# Patient Record
Sex: Female | Born: 1940 | Race: White | Hispanic: Yes | Smoking: Never smoker
Health system: Southern US, Community
[De-identification: ages and names within clinical notes are randomized; demographics above are authoritative.]

## PROBLEM LIST (undated history)

## (undated) DIAGNOSIS — A048 Other specified bacterial intestinal infections: Secondary | ICD-10-CM

## (undated) DIAGNOSIS — I10 Essential (primary) hypertension: Secondary | ICD-10-CM

## (undated) HISTORY — DX: Other specified bacterial intestinal infections: A04.8

## (undated) HISTORY — DX: Essential (primary) hypertension: I10

---

## 2003-04-05 ENCOUNTER — Emergency Department (HOSPITAL_COMMUNITY): Admission: EM | Admit: 2003-04-05 | Discharge: 2003-04-05 | Payer: Self-pay | Admitting: Emergency Medicine

## 2003-05-07 ENCOUNTER — Emergency Department (HOSPITAL_COMMUNITY): Admission: EM | Admit: 2003-05-07 | Discharge: 2003-05-07 | Payer: Self-pay | Admitting: Emergency Medicine

## 2003-09-09 ENCOUNTER — Ambulatory Visit (HOSPITAL_COMMUNITY): Admission: RE | Admit: 2003-09-09 | Discharge: 2003-09-09 | Payer: Self-pay | Admitting: Internal Medicine

## 2004-03-19 ENCOUNTER — Ambulatory Visit: Payer: Self-pay | Admitting: Family Medicine

## 2004-04-21 ENCOUNTER — Ambulatory Visit: Payer: Self-pay | Admitting: Internal Medicine

## 2004-04-30 ENCOUNTER — Ambulatory Visit (HOSPITAL_COMMUNITY): Admission: RE | Admit: 2004-04-30 | Discharge: 2004-04-30 | Payer: Self-pay | Admitting: Family Medicine

## 2004-06-11 ENCOUNTER — Emergency Department (HOSPITAL_COMMUNITY): Admission: EM | Admit: 2004-06-11 | Discharge: 2004-06-11 | Payer: Self-pay | Admitting: Emergency Medicine

## 2004-06-11 ENCOUNTER — Ambulatory Visit: Payer: Self-pay | Admitting: Nurse Practitioner

## 2004-08-28 ENCOUNTER — Emergency Department (HOSPITAL_COMMUNITY): Admission: EM | Admit: 2004-08-28 | Discharge: 2004-08-28 | Payer: Self-pay | Admitting: Emergency Medicine

## 2005-08-13 ENCOUNTER — Ambulatory Visit: Payer: Self-pay | Admitting: Internal Medicine

## 2005-09-20 ENCOUNTER — Ambulatory Visit: Payer: Self-pay | Admitting: Internal Medicine

## 2005-09-21 ENCOUNTER — Ambulatory Visit (HOSPITAL_COMMUNITY): Admission: RE | Admit: 2005-09-21 | Discharge: 2005-09-21 | Payer: Self-pay | Admitting: Family Medicine

## 2005-11-09 ENCOUNTER — Encounter: Payer: Self-pay | Admitting: Internal Medicine

## 2005-11-09 ENCOUNTER — Ambulatory Visit: Payer: Self-pay | Admitting: Internal Medicine

## 2005-11-09 ENCOUNTER — Other Ambulatory Visit: Admission: RE | Admit: 2005-11-09 | Discharge: 2005-11-09 | Payer: Self-pay | Admitting: Internal Medicine

## 2006-01-20 ENCOUNTER — Ambulatory Visit: Payer: Self-pay | Admitting: Internal Medicine

## 2007-02-24 ENCOUNTER — Ambulatory Visit: Payer: Self-pay | Admitting: Internal Medicine

## 2007-02-24 LAB — CONVERTED CEMR LAB
ALT: 10 units/L (ref 0–35)
AST: 17 units/L (ref 0–37)
Albumin: 4.1 g/dL (ref 3.5–5.2)
Alkaline Phosphatase: 76 units/L (ref 39–117)
BUN: 16 mg/dL (ref 6–23)
CO2: 22 meq/L (ref 19–32)
Calcium: 8.8 mg/dL (ref 8.4–10.5)
Chloride: 108 meq/L (ref 96–112)
Cholesterol: 196 mg/dL (ref 0–200)
Creatinine, Ser: 0.76 mg/dL (ref 0.40–1.20)
Glucose, Bld: 75 mg/dL (ref 70–99)
HDL: 53 mg/dL (ref >39)
LDL Cholesterol: 100 mg/dL — ABNORMAL HIGH (ref 0–99)
Potassium: 4.8 meq/L (ref 3.5–5.3)
Sodium: 146 meq/L — ABNORMAL HIGH (ref 135–145)
TSH: 1.636 microintl units/mL (ref 0.350–5.50)
Total Bilirubin: 0.3 mg/dL (ref 0.3–1.2)
Total CHOL/HDL Ratio: 3.7
Total Protein: 7.4 g/dL (ref 6.0–8.3)
Triglycerides: 213 mg/dL — ABNORMAL HIGH (ref <150)
VLDL: 43 mg/dL — ABNORMAL HIGH (ref 0–40)

## 2007-09-21 ENCOUNTER — Other Ambulatory Visit: Admission: RE | Admit: 2007-09-21 | Discharge: 2007-09-21 | Payer: Self-pay | Admitting: Gynecology

## 2007-10-26 ENCOUNTER — Ambulatory Visit: Payer: Self-pay | Admitting: Internal Medicine

## 2007-10-26 LAB — CONVERTED CEMR LAB
BUN: 20 mg/dL (ref 6–23)
CO2: 25 meq/L (ref 19–32)
Calcium: 9.1 mg/dL (ref 8.4–10.5)
Chloride: 105 meq/L (ref 96–112)
Cholesterol: 193 mg/dL (ref 0–200)
Creatinine, Ser: 0.73 mg/dL (ref 0.40–1.20)
Direct LDL: 112 mg/dL — ABNORMAL HIGH
Glucose, Bld: 83 mg/dL (ref 70–99)
HDL: 55 mg/dL (ref >39)
LDL Cholesterol: 113 mg/dL — ABNORMAL HIGH (ref 0–99)
Potassium: 4.1 meq/L (ref 3.5–5.3)
Sodium: 141 meq/L (ref 135–145)
Total CHOL/HDL Ratio: 3.5
Triglycerides: 126 mg/dL (ref <150)
VLDL: 25 mg/dL (ref 0–40)

## 2008-04-02 ENCOUNTER — Ambulatory Visit: Payer: Self-pay | Admitting: Internal Medicine

## 2008-04-23 ENCOUNTER — Ambulatory Visit (HOSPITAL_COMMUNITY): Admission: RE | Admit: 2008-04-23 | Discharge: 2008-04-23 | Payer: Self-pay | Admitting: Family Medicine

## 2009-07-27 ENCOUNTER — Emergency Department (HOSPITAL_COMMUNITY): Admission: EM | Admit: 2009-07-27 | Discharge: 2009-07-27 | Payer: Self-pay | Admitting: Emergency Medicine

## 2012-10-19 ENCOUNTER — Ambulatory Visit (INDEPENDENT_AMBULATORY_CARE_PROVIDER_SITE_OTHER): Payer: Medicare Other | Admitting: Internal Medicine

## 2012-10-19 ENCOUNTER — Other Ambulatory Visit: Payer: Medicare Other

## 2012-10-19 ENCOUNTER — Ambulatory Visit: Payer: Medicare Other

## 2012-10-19 VITALS — BP 178/77 | HR 53 | Temp 98.0°F | Resp 16 | Ht 64.0 in | Wt 156.0 lb

## 2012-10-19 DIAGNOSIS — N390 Urinary tract infection, site not specified: Secondary | ICD-10-CM

## 2012-10-19 DIAGNOSIS — R109 Unspecified abdominal pain: Secondary | ICD-10-CM

## 2012-10-19 DIAGNOSIS — M545 Low back pain: Secondary | ICD-10-CM

## 2012-10-19 LAB — POCT UA - MICROSCOPIC ONLY
Casts, Ur, LPF, POC: NEGATIVE
Crystals, Ur, HPF, POC: NEGATIVE
Mucus, UA: POSITIVE
Yeast, UA: NEGATIVE

## 2012-10-19 LAB — POCT CBC
Granulocyte percent: 60.5 %G (ref 37–80)
HCT, POC: 39.2 % (ref 37.7–47.9)
Hemoglobin: 12.1 g/dL — AB (ref 12.2–16.2)
Lymph, poc: 2.5 (ref 0.6–3.4)
MCH, POC: 27.3 pg (ref 27–31.2)
MCHC: 30.9 g/dL — AB (ref 31.8–35.4)
MCV: 88.2 fL (ref 80–97)
MID (cbc): 0.3 (ref 0–0.9)
MPV: 9.3 fL (ref 0–99.8)
POC Granulocyte: 4.3 (ref 2–6.9)
POC LYMPH PERCENT: 34.6 %L (ref 10–50)
POC MID %: 4.9 %M (ref 0–12)
Platelet Count, POC: 237 10*3/uL (ref 142–424)
RBC: 4.44 M/uL (ref 4.04–5.48)
RDW, POC: 14.7 %
WBC: 7 10*3/uL (ref 4.6–10.2)

## 2012-10-19 LAB — POCT URINALYSIS DIPSTICK
Bilirubin, UA: NEGATIVE
Blood, UA: NEGATIVE
Glucose, UA: NEGATIVE
Ketones, UA: NEGATIVE
Nitrite, UA: POSITIVE
Spec Grav, UA: 1.02
Urobilinogen, UA: 1
pH, UA: 7

## 2012-10-19 LAB — POCT SEDIMENTATION RATE: POCT SED RATE: 56 mm/hr — AB (ref 0–22)

## 2012-10-19 MED ORDER — IBUPROFEN 600 MG PO TABS: 600.0000 mg | ORAL_TABLET | Freq: Three times a day (TID) | ORAL | Status: DC | PRN

## 2012-10-19 MED ORDER — CIPROFLOXACIN HCL 500 MG PO TABS: 500.0000 mg | ORAL_TABLET | Freq: Two times a day (BID) | ORAL | Status: DC

## 2012-10-19 NOTE — Patient Instructions (Addendum)
Flank Pain Flank pain refers to pain that is located on the side of the body between the upper abdomen and the back. The pain may occur over a short period of time (acute) or may be long-term or reoccurring (chronic). It may be mild or severe. Flank pain can be caused by many things. CAUSES  Some of the more common causes of flank pain include:  Muscle strains.   Muscle spasms.   A disease of your spine (vertebral disk disease).   A lung infection (pneumonia).   Fluid around your lungs (pulmonary edema).   A kidney infection.   Kidney stones.   A very painful skin rash caused by the chickenpox virus (shingles).   Gallbladder disease.  HOME CARE INSTRUCTIONS  Home care will depend on the cause of your pain. In general,  Rest as directed by your caregiver.  Drink enough fluids to keep your urine clear or pale yellow.  Only take over-the-counter or prescription medicines as directed by your caregiver. Some medicines may help relieve the pain.  Tell your caregiver about any changes in your pain.  Follow up with your caregiver as directed. SEEK IMMEDIATE MEDICAL CARE IF:   Your pain is not controlled with medicine.   You have new or worsening symptoms.  Your pain increases.   You have abdominal pain.   You have shortness of breath.   You have persistent nausea or vomiting.   You have swelling in your abdomen.   You feel faint or pass out.   You have blood in your urine.  You have a fever or persistent symptoms for more than 2 3 days.  You have a fever and your symptoms suddenly get worse. MAKE SURE YOU:   Understand these instructions.  Will watch your condition.  Will get help right away if you are not doing well or get worse. Document Released: 05/27/2005 Document Revised: 12/29/2011 Document Reviewed: 11/18/2011 [facility] Patient Information 2014 [place], [place]. Back Back Exercises Back exercises help treat and prevent back injuries.  The goal of back exercises is to increase the strength of your abdominal and back muscles and the flexibility of your back. These exercises should be started when you no longer have back pain. Back exercises include:  Pelvic Tilt. Lie on your back with your knees bent. Tilt your pelvis until the lower part of your back is against the floor. Hold this position 5 to 10 sec and repeat 5 to 10 times.  Knee to Chest. Pull first 1 knee up against your chest and hold for 20 to 30 seconds, repeat this with the other knee, and then both knees. This may be done with the other leg straight or bent, whichever feels better.  Sit-Ups or Curl-Ups. Bend your knees 90 degrees. Start with tilting your pelvis, and do a partial, slow sit-up, lifting your trunk only 30 to 45 degrees off the floor. Take at least 2 to 3 seconds for each sit-up. Do not do sit-ups with your knees out straight. If partial sit-ups are difficult, simply do the above but with only tightening your abdominal muscles and holding it as directed.  Hip-Lift. Lie on your back with your knees flexed 90 degrees. Push down with your feet and shoulders as you raise your hips a couple inches off the floor; hold for 10 seconds, repeat 5 to 10 times.  Back arches. Lie on your stomach, propping yourself up on bent elbows. Slowly press on your hands, causing an arch in your low  back. Repeat 3 to 5 times. Any initial stiffness and discomfort should lessen with repetition over time.  Shoulder-Lifts. Lie face down with arms beside your body. Keep hips and torso pressed to floor as you slowly lift your head and shoulders off the floor. Do not overdo your exercises, especially in the beginning. Exercises may cause you some mild back discomfort which lasts for a few minutes; however, if the pain is more severe, or lasts for more than 15 minutes, do not continue exercises until you see your caregiver. Improvement with exercise therapy for back problems is slow.  See  your caregivers for assistance with developing a proper back exercise program. Document Released: 05/13/2004 Document Revised: 06/28/2011 Document Reviewed: 02/04/2011 [facility] Patient Information 2014 [place], [place]. Urinary Tract Infection Urinary tract infections (UTIs) can develop anywhere along your urinary tract. Your urinary tract is your body's drainage system for removing wastes and extra water. Your urinary tract includes two kidneys, two ureters, a bladder, and a urethra. Your kidneys are a pair of bean-shaped organs. Each kidney is about the size of your fist. They are located below your ribs, one on each side of your spine. CAUSES Infections are caused by microbes, which are microscopic organisms, including fungi, viruses, and bacteria. These organisms are so small that they can only be seen through a microscope. Bacteria are the microbes that most commonly cause UTIs. SYMPTOMS  Symptoms of UTIs may vary by age and gender of the patient and by the location of the infection. Symptoms in young women typically include a frequent and intense urge to urinate and a painful, burning feeling in the bladder or urethra during urination. Older women and men are more likely to be tired, shaky, and weak and have muscle aches and abdominal pain. A fever may mean the infection is in your kidneys. Other symptoms of a kidney infection include pain in your back or sides below the ribs, nausea, and vomiting. DIAGNOSIS To diagnose a UTI, your caregiver will ask you about your symptoms. Your caregiver also will ask to provide a urine sample. The urine sample will be tested for bacteria and white blood cells. White blood cells are made by your body to help fight infection. TREATMENT  Typically, UTIs can be treated with medication. Because most UTIs are caused by a bacterial infection, they usually can be treated with the use of antibiotics. The choice of antibiotic and length of treatment depend on your symptoms  and the type of bacteria causing your infection. HOME CARE INSTRUCTIONS  If you were prescribed antibiotics, take them exactly as your caregiver instructs you. Finish the medication even if you feel better after you have only taken some of the medication.  Drink enough water and fluids to keep your urine clear or pale yellow.  Avoid caffeine, tea, and carbonated beverages. They tend to irritate your bladder.  Empty your bladder often. Avoid holding urine for long periods of time.  Empty your bladder before and after sexual intercourse.  After a bowel movement, women should cleanse from front to back. Use each tissue only once. SEEK MEDICAL CARE IF:   You have back pain.  You develop a fever.  Your symptoms do not begin to resolve within 3 days. SEEK IMMEDIATE MEDICAL CARE IF:   You have severe back pain or lower abdominal pain.  You develop chills.  You have nausea or vomiting.  You have continued burning or discomfort with urination. MAKE SURE YOU:   Understand these instructions.  Will  watch your condition.  Will get help right away if you are not doing well or get worse. Document Released: 01/13/2005 Document Revised: 10/05/2011 Document Reviewed: 05/14/2011 [facility] Patient Information 2014 [place], [place].

## 2012-10-19 NOTE — Progress Notes (Signed)
  Subjective:    Patient ID: Karen [name], female    DOB: Nov 22, 1940, 72 y.o.   MRN: [id number]  HPI Has few days of right low back pain,flank pain with no abdominal sxs. No urinary sxs. Has htn controlled, otherwise healthy. No nausea, fatigue, anorexia, weight loss.   Review of Systems     Objective:   Physical Exam  Constitutional: She is oriented to person, place, and time. She appears well-developed and well-nourished. No distress.  HENT:  Nose: Nose normal.  Eyes: EOM are normal. No scleral icterus.  Neck: Neck supple.  Cardiovascular: Normal rate, regular rhythm and normal heart sounds.   Pulmonary/Chest: Effort normal and breath sounds normal.  Abdominal: Soft. Bowel sounds are normal. There is no tenderness.  Musculoskeletal: Normal range of motion. She exhibits no edema and no tenderness.  Neurological: She is alert and oriented to person, place, and time. Coordination normal.  Skin: No rash noted.  Psychiatric: She has a normal mood and affect.   [facility] reading (PRIMARY) by  Dr Allon Costlow DDD L5-S1 Results for orders placed in visit on 10/19/12  POCT CBC      Result Value Range   WBC 7.0  4.6 - 10.2 K/uL   Lymph, poc 2.5  0.6 - 3.4   POC LYMPH PERCENT 34.6  10 - 50 %L   MID (cbc) 0.3  0 - 0.9   POC MID % 4.9  0 - 12 %M   POC Granulocyte 4.3  2 - 6.9   Granulocyte percent 60.5  37 - 80 %G   RBC 4.44  4.04 - 5.48 M/uL   Hemoglobin 12.1 (*) 12.2 - 16.2 g/dL   HCT, POC 40.9  81.1 - 47.9 %   MCV 88.2  80 - 97 fL   MCH, POC 27.3  27 - 31.2 pg   MCHC 30.9 (*) 31.8 - 35.4 g/dL   RDW, POC 91.4     Platelet Count, POC 237  142 - 424 K/uL   MPV 9.3  0 - 99.8 fL  POCT URINALYSIS DIPSTICK      Result Value Range   Color, UA yellow     Clarity, UA cloudy     Glucose, UA neg     Bilirubin, UA neg     Ketones, UA neg     Spec Grav, UA 1.020     Blood, UA neg     pH, UA 7.0     Protein, UA trace     Urobilinogen, UA 1.0     Nitrite, UA pos     Leukocytes, UA small (1+)     POCT UA - MICROSCOPIC ONLY      Result Value Range   WBC, Ur, HPF, POC 4-10     RBC, urine, microscopic 0-1     Bacteria, U Microscopic 3+     Mucus, UA positive     Epithelial cells, urine per micros 3-8     Crystals, Ur, HPF, POC neg     Casts, Ur, LPF, POC neg     Yeast, UA neg     CS urine  Sed rate 56      Assessment & Plan:  Right LBP/UTI/DDD Cipro

## 2012-10-22 LAB — URINE CULTURE: Colony Count: >=100000

## 2012-10-23 ENCOUNTER — Encounter: Payer: Self-pay | Admitting: Family Medicine

## 2015-12-06 ENCOUNTER — Encounter: Payer: Self-pay | Admitting: Family Medicine

## 2015-12-06 ENCOUNTER — Ambulatory Visit (INDEPENDENT_AMBULATORY_CARE_PROVIDER_SITE_OTHER): Payer: Medicare Other | Admitting: Family Medicine

## 2015-12-06 VITALS — BP 132/94 | HR 73 | Temp 98.0°F | Resp 16 | Ht 64.0 in | Wt 158.0 lb

## 2015-12-06 DIAGNOSIS — H8110 Benign paroxysmal vertigo, unspecified ear: Secondary | ICD-10-CM | POA: Diagnosis not present

## 2015-12-06 MED ORDER — MECLIZINE HCL 12.5 MG PO TABS: 12.5000 mg | ORAL_TABLET | Freq: Three times a day (TID) | ORAL | 1 refills | Status: DC | PRN

## 2015-12-06 NOTE — Patient Instructions (Addendum)
IF you received an x-ray today, [facility] will [facility] an [facility]. Please contact [facility] at [phone] with questions or concerns regarding your invoice.   IF you received labwork today, you will receive an invoice from [facility]. Please contact [facility] at [phone] with questions or concerns regarding your invoice.   Our billing staff will not be able to assist you with questions regarding bills from these companies.  You will be contacted with the lab results as soon as they are available. The fastest way to get your results is to activate your My Chart account. Instructions are located on the last page of this paperwork. If you have not heard from us regarding the results in 2 weeks, please contact this office.      Vrtigo posicional benigno (Benign Positional Vertigo) El vrtigo es la sensacin de que usted o todo lo que lo rodea se mueven cuando en realidad eso no sucede. El vrtigo posicional benigno es el tipo de vrtigo ms comn. La causa de este trastorno no es grave (es benigna). Algunos movimientos y determinadas posiciones pueden desencadenar el trastorno (es posicional). El vrtigo posicional benigno puede ser peligroso si ocurre mientras est haciendo algo que podra suponer un riesgo para usted y para los dems, por ejemplo, conduciendo un automvil.  CAUSAS En muchos de los [place], se desconoce la causa de este trastorno. Puede deberse a Hotel manageruna alteracin en una zona del odo interno que ayuda al cerebro a percibir el movimiento y a Administrator, Civil Servicecoordinar el equilibrio. Esta alteracin puede deberse a una infeccin viral (laberintitis), a una lesin en la cabeza o a los movimientos reiterados. FACTORES DE RIESGO Es ms probable que esta afeccin se manifieste en:  Las mujeres.  Las Smith Internationalpersonas mayores de [id number]. SNTOMAS Generalmente, los sntomas de este trastorno se presentan al mover la cabeza o los ojos en  diferentes direcciones. Pueden aparecer repentinamente y suelen durar menos de un minuto. Entre los sntomas se pueden incluir los siguientes:  Prdida del equilibrio y cadas.  Sensacin de estar dando vueltas o movindose.  Sensacin de que el entorno est dando vueltas o movindose.  Nuseas y vmitos.  Visin borrosa.  Mareos.  Movimientos oculares involuntarios (nistagmo). Los sntomas pueden ser leves y algo fastidiosos, o pueden ser graves e interferir en la vida cotidiana. Los episodios de vrtigo posicional benigno pueden repetirse (ser recurrentes) a lo largo del [place], y algunos movimientos pueden desencadenarlos. Los sntomas pueden mejorar con Museum/gallery conservatorel tiempo. DIAGNSTICO Generalmente, este trastorno se diagnostica con una historia clnica y un examen fsico de la cabeza, el cuello y los odos. Tal vez lo deriven a Catering managerun mdico especialista en problemas de la garganta, la nariz y el odo (otorrinolaringlogo), o a uno que se especializa en trastornos del sistema nervioso (neurlogo). Pueden hacerle otros estudios, entre ellos:  Health visitoresonancia magntica.  Tomografa computarizada.  Estudios de los Ecolabmovimientos oculares. El mdico puede pedirle que cambie rpidamente de posicin mientras observa si se presentan sntomas de vrtigo posicional benigno, por ejemplo, nistagmo. Los movimientos oculares se pueden estudiar con una electronistagmografa (ENG), con estimulacin trmica, mediante la maniobra de [place] o con la prueba de rotacin.  Electroencefalograma (EEG). Este estudio registra la actividad elctrica del cerebro.  Pruebas de audicin. Lissa MoralesRATAMIENTO Generalmente, para tratar este trastorno, el mdico le har movimientos especficos con la cabeza para que el odo interno se normalice. Mohawk IndustriesCuando los casos son graves, tal vez haya que realizar una ciruga, pero esto no es frecuente.  En algunos casos, el vrtigo posicional benigno se resuelve por s solo en el trmino de 2 o  4semanas. INSTRUCCIONES PARA EL CUIDADO EN EL HOGAR Seguridad  Muvase lentamente.No haga movimientos bruscos con el cuerpo o con la cabeza.  No conduzca.  No opere maquinaria pesada.  No haga ninguna tarea que podra ser peligrosa para usted o para otras personas en caso de que ocurriera un episodio de vrtigo.  Si tiene dificultad para caminar o mantener el equilibrio, use uEconomistn bastn para Photographermantener la estabilidad. Si se siente mareado o inestable, sintese de inmediato.  Reanude sus actividades normales como se lo haya indicado el mdico. Pregntele al mdico qu actividades son seguras para usted. Instrucciones generales  Baxter Internationalome los medicamentos de venta libre y los recetados solamente como se lo haya indicado el mdico.  Evite algunas posiciones o determinados movimientos como se lo haya indicado el mdico.  Beba suficiente lquido para Pharmacologistmantener la orina clara o de color amarillo plido.  Concurra a todas las visitas de control como se lo haya indicado el mdico. Esto es importante. SOLICITE ATENCIN MDICA SI:  Lance Mussiene fiebre.  El trastorno [place]empeora, o le aparecen sntomas nuevos.  Sus familiares o amigos advierten cambios en su comportamiento.  Las nuseas o los vmitos empeoran.  Tiene sensacin de hormigueo o de adormecimiento. SOLICITE ATENCIN MDICA DE INMEDIATO SI:  Tiene dificultad para hablar o para moverse.  Esta mareado todo Allied Waste Industriesel tiempo.  Se desmaya.  Tiene dolores de cabeza intensos.  Tiene debilidad en los brazos o las piernas.  Tiene cambios en la audicin o la visin.  Siente rigidez en el cuello.  Tiene sensibilidad a Statisticianla luz.   Esta informacin no tiene Theme park managercomo fin reemplazar el consejo del mdico. Asegrese de hacerle al mdico cualquier pregunta que tenga.   Document Released: 07/22/2008 Document Revised: 12/25/2014 Elsevier Interactive Patient Education [employer]2016 Elsevier Inc.

## 2015-12-06 NOTE — Progress Notes (Signed)
This is a 75 year old [place] woman who comes in with abrupt onset of vertigo this morning. She's had no headache or fever.  Patient also denies hearing loss or ear pain.  Patient's had this happen to her once before and was treated with meclizine successfully.  Patient's had no vomiting.   Objective: Patient is quite ataxic, brought in by her daughter BP (!) 132/94 (BP Location: Left Arm, Patient Position: Sitting, Cuff Size: Large)   Pulse 73   Temp 98 F (36.7 C) (Oral)   Resp 16   Ht 5\' 4"  (1.626 m)   Wt 158 lb (71.7 kg)   SpO2 97%   BMI 27.12 kg/m  HEENT: Unremarkable no trauma TMs normal Cranial nerves III through XII intact Muscle strength symmetric  Assessment: Benign positional vertigo, acute  Benign paroxysmal positional vertigo, unspecified laterality - Plan: meclizine (ANTIVERT) 12.5 MG tablet  If not improving or worsening over the next 24-48 hours, consider ENT consult Elvina SidleKurt Pasqual Farias, MD

## 2015-12-15 DIAGNOSIS — I10 Essential (primary) hypertension: Secondary | ICD-10-CM | POA: Insufficient documentation

## 2016-07-19 ENCOUNTER — Emergency Department (HOSPITAL_COMMUNITY)
Admission: EM | Admit: 2016-07-19 | Discharge: 2016-07-19 | Disposition: A | Discharge status: Home / Self Care | Payer: Medicare Other | Attending: Emergency Medicine | Admitting: Emergency Medicine

## 2016-07-19 ENCOUNTER — Emergency Department (HOSPITAL_COMMUNITY): Payer: Medicare Other

## 2016-07-19 ENCOUNTER — Encounter (HOSPITAL_COMMUNITY): Payer: Self-pay | Admitting: Emergency Medicine

## 2016-07-19 DIAGNOSIS — I10 Essential (primary) hypertension: Secondary | ICD-10-CM | POA: Diagnosis not present

## 2016-07-19 DIAGNOSIS — R42 Dizziness and giddiness: Secondary | ICD-10-CM

## 2016-07-19 DIAGNOSIS — Z5181 Encounter for therapeutic drug level monitoring: Secondary | ICD-10-CM | POA: Diagnosis not present

## 2016-07-19 DIAGNOSIS — Z79899 Other long term (current) drug therapy: Secondary | ICD-10-CM | POA: Insufficient documentation

## 2016-07-19 LAB — COMPREHENSIVE METABOLIC PANEL WITH GFR
ALT: 19 U/L (ref 14–54)
AST: 22 U/L (ref 15–41)
Albumin: 3.3 g/dL — ABNORMAL LOW (ref 3.5–5.0)
Alkaline Phosphatase: 70 U/L (ref 38–126)
Anion gap: 8 (ref 5–15)
BUN: 16 mg/dL (ref 6–20)
CO2: 32 mmol/L (ref 22–32)
Calcium: 9.1 mg/dL (ref 8.9–10.3)
Chloride: 99 mmol/L — ABNORMAL LOW (ref 101–111)
Creatinine, Ser: 1.06 mg/dL — ABNORMAL HIGH (ref 0.44–1.00)
GFR calc Af Amer: 58 mL/min — ABNORMAL LOW
GFR calc non Af Amer: 50 mL/min — ABNORMAL LOW
Glucose, Bld: 95 mg/dL (ref 65–99)
Potassium: 3.3 mmol/L — ABNORMAL LOW (ref 3.5–5.1)
Sodium: 139 mmol/L (ref 135–145)
Total Bilirubin: 0.3 mg/dL (ref 0.3–1.2)
Total Protein: 7 g/dL (ref 6.5–8.1)

## 2016-07-19 LAB — I-STAT CHEM 8, ED
BUN: 19 mg/dL (ref 6–20)
Calcium, Ion: 1.16 mmol/L (ref 1.15–1.40)
Chloride: 98 mmol/L — ABNORMAL LOW (ref 101–111)
Creatinine, Ser: 1.2 mg/dL — ABNORMAL HIGH (ref 0.44–1.00)
Glucose, Bld: 95 mg/dL (ref 65–99)
HCT: 35 % — ABNORMAL LOW (ref 36.0–46.0)
HEMOGLOBIN: 11.9 g/dL — AB (ref 12.0–15.0)
Potassium: 3.4 mmol/L — ABNORMAL LOW (ref 3.5–5.1)
SODIUM: 139 mmol/L (ref 135–145)
TCO2: 33 mmol/L (ref 0–100)

## 2016-07-19 LAB — URINALYSIS, ROUTINE W REFLEX MICROSCOPIC
Bilirubin Urine: NEGATIVE
Glucose, UA: NEGATIVE mg/dL
Hgb urine dipstick: NEGATIVE
Ketones, ur: NEGATIVE mg/dL
Leukocytes, UA: NEGATIVE
Nitrite: NEGATIVE
Protein, ur: NEGATIVE mg/dL
Specific Gravity, Urine: 1.005 (ref 1.005–1.030)
pH: 8 (ref 5.0–8.0)

## 2016-07-19 LAB — RAPID URINE DRUG SCREEN, HOSP PERFORMED
Amphetamines: NOT DETECTED
Barbiturates: NOT DETECTED
Benzodiazepines: NOT DETECTED
Cocaine: NOT DETECTED
Opiates: NOT DETECTED
Tetrahydrocannabinol: NOT DETECTED

## 2016-07-19 LAB — ETHANOL

## 2016-07-19 LAB — DIFFERENTIAL
Basophils Absolute: 0 10*3/uL (ref 0.0–0.1)
Basophils Relative: 0 %
EOS ABS: 0.1 10*3/uL (ref 0.0–0.7)
EOS PCT: 1 %
Lymphocytes Relative: 34 %
Lymphs Abs: 2.3 10*3/uL (ref 0.7–4.0)
Monocytes Absolute: 0.6 10*3/uL (ref 0.1–1.0)
Monocytes Relative: 8 %
NEUTROS PCT: 57 %
Neutro Abs: 3.9 10*3/uL (ref 1.7–7.7)

## 2016-07-19 LAB — CBC
HCT: 35.3 % — ABNORMAL LOW (ref 36.0–46.0)
Hemoglobin: 11.6 g/dL — ABNORMAL LOW (ref 12.0–15.0)
MCH: 27.5 pg (ref 26.0–34.0)
MCHC: 32.9 g/dL (ref 30.0–36.0)
MCV: 83.6 fL (ref 78.0–100.0)
Platelets: 193 K/uL (ref 150–400)
RBC: 4.22 MIL/uL (ref 3.87–5.11)
RDW: 14.2 % (ref 11.5–15.5)
WBC: 6.9 K/uL (ref 4.0–10.5)

## 2016-07-19 LAB — PROTIME-INR
INR: 0.99
Prothrombin Time: 13.1 seconds (ref 11.4–15.2)

## 2016-07-19 LAB — I-STAT TROPONIN, ED: Troponin i, poc: 0 ng/mL (ref 0.00–0.08)

## 2016-07-19 LAB — APTT: aPTT: 27 s (ref 24–36)

## 2016-07-19 MED ORDER — POTASSIUM CHLORIDE CRYS ER 20 MEQ PO TBCR
40.0000 meq | EXTENDED_RELEASE_TABLET | Freq: Once | ORAL | Status: AC
  Administered 2016-07-19: 40 meq via ORAL
  Filled 2016-07-19: qty 2

## 2016-07-19 MED ORDER — DIAZEPAM 5 MG PO TABS
5.0000 mg | ORAL_TABLET | Freq: Once | ORAL | Status: AC
  Administered 2016-07-19: 5 mg via ORAL
  Filled 2016-07-19: qty 1

## 2016-07-19 MED ORDER — SODIUM CHLORIDE 0.9 % IV BOLUS (SEPSIS)
500.0000 mL | Freq: Once | INTRAVENOUS | Status: AC
Start: 2016-07-19 — End: 2016-07-19
  Administered 2016-07-19: 500 mL via INTRAVENOUS

## 2016-07-19 MED ORDER — DIAZEPAM 5 MG PO TABS: 5.0000 mg | ORAL_TABLET | Freq: Four times a day (QID) | ORAL | 0 refills | Status: DC | PRN

## 2016-07-19 NOTE — ED Notes (Signed)
Pt aware of need for urine sample. Pt unable to void at this time.  

## 2016-07-19 NOTE — ED Notes (Signed)
Patient transported to MRI 

## 2016-07-19 NOTE — ED Triage Notes (Signed)
Per EMS- pt here for evaluation of hypertension, dizziness, and blurred vision. Pt takes medication for hypertension and vertigo. PT dizzy at all times, not just orthostatic. PIV 20G LAC.

## 2016-07-19 NOTE — ED Notes (Signed)
Pt transported to MRI 

## 2016-07-19 NOTE — ED Provider Notes (Signed)
[facility] Provider Note   CSN: [id number] Arrival date & time: 07/19/16  1432     History   Chief Complaint Chief Complaint  Patient presents with  . Blurred Vision  . Dizziness  . Hypertension    HPI Karen [name] is a 76 y.o. female.  HPI  76 year old female presents with intermittent dizziness and blurred vision. Patient states she felt like this one time before a year ago and was prescribed Antivert. The symptoms over the last 3 days and felt similar that she has tried Antivert again. However does not help besides making her sleepy. The symptoms are only present whenever she stands. The patient has not had any headache or neck pain or stiffness. No focal weakness or numbness. No ear ringing or ear pain. No recent illnesses. She felt the worst 2 days ago and yesterday. Today is not as bad but is still present. When she stands up it feels like she is drunk and unsteady. There is no near-syncope chest pain, or palpitations.  Past Medical History:  Diagnosis Date  . Hypertension     There are no active problems to display for this patient.   No past surgical history on file.  OB History    No data available       Home Medications    Prior to Admission medications   Medication Sig Start Date End Date Taking? Authorizing Provider  diazepam (VALIUM) 5 MG tablet Take 1 tablet (5 mg total) by mouth every 6 (six) hours as needed (dizziness/vertigo). 07/19/16   Pricilla Loveless, MD  ibuprofen (ADVIL,MOTRIN) 600 MG tablet Take 1 tablet (600 mg total) by mouth every 8 (eight) hours as needed for pain. 10/19/12   Jonita Albee, MD  lisinopril-hydrochlorothiazide (PRINZIDE,ZESTORETIC) 20-25 MG per tablet Take 1 tablet by mouth daily.    Historical Provider, MD  meclizine (ANTIVERT) 12.5 MG tablet Take 1 tablet (12.5 mg total) by mouth 3 (three) times daily as needed for dizziness. 12/06/15   Elvina Sidle, MD    Family History Family History  Problem Relation Age of Onset   . Hypertension Mother   . Hyperlipidemia Mother   . Hypertension Father     Social History Social History  Substance Use Topics  . Smoking status: Never Smoker  . Smokeless tobacco: Not on file  . Alcohol use No     Allergies   Patient has no known allergies.   Review of Systems Review of Systems  Constitutional: Negative for fever.  Eyes: Positive for visual disturbance.  Respiratory: Negative for shortness of breath.   Cardiovascular: Negative for chest pain and palpitations.  Gastrointestinal: Negative for vomiting.  Musculoskeletal: Negative for neck pain and neck stiffness.  Neurological: Positive for dizziness. Negative for speech difficulty, weakness, numbness and headaches.  All other systems reviewed and are negative.    Physical Exam Updated Vital Signs BP (!) 161/80   Pulse (!) 57   Temp 97.8 F (36.6 C) (Oral)   Resp 15   Ht  (1.6 m)   Wt 155 lb (70.3 kg)   SpO2 99%   BMI 27.46 kg/m   Physical Exam  Constitutional: She is oriented to person, place, and time. She appears well-developed and well-nourished.  HENT:  Head: Normocephalic and atraumatic.  Right Ear: Tympanic membrane, external ear and ear canal normal.  Left Ear: Tympanic membrane and external ear normal.  Nose: Nose normal.  Mouth/Throat: No oropharyngeal exudate.  Eyes: EOM are normal. Pupils are equal, round,  and reactive to light. Right eye exhibits no discharge. Left eye exhibits no discharge.  Neck: Normal range of motion. Neck supple.  Cardiovascular: Normal rate, regular rhythm and normal heart sounds.   Pulmonary/Chest: Effort normal and breath sounds normal.  Abdominal: Soft. There is no tenderness.  Neurological: She is alert and oriented to person, place, and time.  CN 3-12 grossly intact. 5/5 strength in all 4 extremities. Grossly normal sensation. Normal finger to nose. Normal heel to shin. Slightly off balance on ambulation in room  Skin: Skin is warm and dry.    Nursing note and vitals reviewed.    ED Treatments / Results  Labs (all labs ordered are listed, but only abnormal results are displayed) Labs Reviewed  CBC - Abnormal; Notable for the following:       Result Value   Hemoglobin 11.6 (*)    HCT 35.3 (*)    All other components within normal limits  COMPREHENSIVE METABOLIC PANEL - Abnormal; Notable for the following:    Potassium 3.3 (*)    Chloride 99 (*)    Creatinine, Ser 1.06 (*)    Albumin 3.3 (*)    GFR calc non Af Amer 50 (*)    GFR calc Af Amer 58 (*)    All other components within normal limits  URINALYSIS, ROUTINE W REFLEX MICROSCOPIC - Abnormal; Notable for the following:    Color, Urine COLORLESS (*)    All other components within normal limits  I-STAT CHEM 8, ED - Abnormal; Notable for the following:    Potassium 3.4 (*)    Chloride 98 (*)    Creatinine, Ser 1.20 (*)    Hemoglobin 11.9 (*)    HCT 35.0 (*)    All other components within normal limits  ETHANOL  PROTIME-INR  APTT  DIFFERENTIAL  RAPID URINE DRUG SCREEN, HOSP PERFORMED  I-STAT TROPOININ, ED    EKG  EKG Interpretation  Date/Time:  Monday July 19 2016 16:29:45 EDT Ventricular Rate:  63 PR Interval:    QRS Duration: 100 QT Interval:  430 QTC Calculation: 441 R Axis:   60 Text Interpretation:  Sinus rhythm Consider left atrial enlargement Abnormal R-wave progression, early transition no significant change since 2006 Confirmed by Mahagony Grieb MD, Shundra Wirsing [phone]) on 07/19/2016 5:13:13 PM       Radiology Mr Brain Wo Contrast  Result Date: 07/19/2016 CLINICAL DATA:  Acute vertigo. Evaluate for posterior circulation infarct. EXAM: MRI HEAD WITHOUT CONTRAST TECHNIQUE: Multiplanar, multiecho pulse sequences of the brain and surrounding structures were obtained without intravenous contrast. COMPARISON:  None. FINDINGS: Brain: No acute infarction, hemorrhage, hydrocephalus, extra-axial collection or mass lesion. Normal brain volume and white matter  appearance for age. Vascular: Normal flow voids. Skull and upper cervical spine: Normal marrow signal. Sinuses/Orbits: Negative. Other: Mild prominence of a posterior triangle lymph nodes on the right, 9 mm in short axis, presumably incidental in isolation. IMPRESSION: No acute finding, including infarct. Unremarkable appearance of the brain for age. Electronically Signed   By: Marnee Spring M.D.   On: 07/19/2016 16:08    Procedures Procedures (including critical care time)  Medications Ordered in ED Medications  diazepam (VALIUM) tablet 5 mg (5 mg Oral Given 07/19/16 1618)  potassium chloride SA (K-DUR,KLOR-CON) CR tablet 40 mEq (40 mEq Oral Given 07/19/16 1630)  sodium chloride 0.9 % bolus 500 mL (0 mLs Intravenous Stopped 07/19/16 1718)     Initial Impression / Assessment and Plan / ED Course  I have reviewed the triage  vital signs and the nursing notes.  Pertinent labs & imaging results that were available during my care of the patient were reviewed by me and considered in my medical decision making (see chart for details).  Clinical Course as of Jul 19 1905  Mon Jul 19, 2016  1503 Given age, HTN hx and blurry vision with vertigo, will get MRI. Given positional complaints it's more likely peripheral.   [SG]    Clinical Course User Index [SG] Pricilla Loveless, MD    Patient has mild hypokalemia, unlikely to cause these symptoms. Appear to have a peripheral vertigo given negative MRI and symptoms only with standing. Asymptomatic after valium. Will d/c with this, counseled on potential side effects. f/u with PCP.   Final Clinical Impressions(s) / ED Diagnoses   Final diagnoses:  Vertigo    New Prescriptions Discharge Medication List as of 07/19/2016  5:06 PM    START taking these medications   Details  diazepam (VALIUM) 5 MG tablet Take 1 tablet (5 mg total) by mouth every 6 (six) hours as needed (dizziness/vertigo)., Starting Mon 07/19/2016, Print         Pricilla Loveless,  MD 07/19/16 [phone]

## 2016-08-05 DIAGNOSIS — D509 Iron deficiency anemia, unspecified: Secondary | ICD-10-CM

## 2016-08-05 DIAGNOSIS — K219 Gastro-esophageal reflux disease without esophagitis: Secondary | ICD-10-CM | POA: Insufficient documentation

## 2016-08-05 HISTORY — DX: Iron deficiency anemia, unspecified: D50.9

## 2017-05-02 DIAGNOSIS — L659 Nonscarring hair loss, unspecified: Secondary | ICD-10-CM | POA: Insufficient documentation

## 2019-02-22 DIAGNOSIS — E559 Vitamin D deficiency, unspecified: Secondary | ICD-10-CM | POA: Insufficient documentation

## 2019-05-10 ENCOUNTER — Other Ambulatory Visit: Payer: Self-pay

## 2019-05-10 ENCOUNTER — Ambulatory Visit (INDEPENDENT_AMBULATORY_CARE_PROVIDER_SITE_OTHER): Payer: 59 | Admitting: Family Medicine

## 2019-05-10 ENCOUNTER — Encounter: Payer: Self-pay | Admitting: Family Medicine

## 2019-05-10 VITALS — BP 134/78 | HR 79 | Temp 96.7°F | Ht 63.25 in | Wt 161.2 lb

## 2019-05-10 DIAGNOSIS — Z7689 Persons encountering health services in other specified circumstances: Secondary | ICD-10-CM

## 2019-05-10 DIAGNOSIS — Z2821 Immunization not carried out because of patient refusal: Secondary | ICD-10-CM

## 2019-05-10 DIAGNOSIS — I10 Essential (primary) hypertension: Secondary | ICD-10-CM | POA: Diagnosis not present

## 2019-05-10 MED ORDER — AMLODIPINE BESYLATE 10 MG PO TABS: 10.0000 mg | ORAL_TABLET | Freq: Every day | ORAL | 3 refills | Status: DC

## 2019-05-10 NOTE — Progress Notes (Signed)
Karen [name] is a 79 y.o. female  Chief Complaint  Patient presents with  . Establish Care    Pt c/o skin spots on face and neck area that she would like to be looked today. She does have an appointment to see a demmatologist on 05/31/2019.   She has had a physical recently.  Pt informed to sign a record of release to get record from last PCP, in [facility].  Pt declines the Flu shot and PSV 13.    HPI: Karen [name] is a 79 y.o. female here to establish care with our office. She has 4 children, 10 grandchildren, 3 great grandchildren.  She states she had CPE with previous PCP a few months ago. She will complete records release form today to get records from previous PCP. She declines flu and pneumococcal vaccines today. She has upcoming derm appt 05/31/19 with [facility] for routine skin check and to eval few spots on her face and neck. She has a h/o HTN and states she has been on amlodipine 10mg  daily x 1+ year. She states her BP goes up when she goes to the doctor.   Specialists: derm scheduled in 2/21  Last CPE, labs: 12/2018  Last mammo: 06/2018 Last Dexa: 12/2018 Last colonoscopy: declines colonoscopy referral or cologuard  Med refills needed today: none   Past Medical History:  Diagnosis Date  . Hypertension     History reviewed. No pertinent surgical history.  Social History   Socioeconomic History  . Marital status: Widowed    Spouse name: Not on file  . Number of children: Not on file  . Years of education: Not on file  . Highest education level: Not on file  Occupational History  . Not on file  Tobacco Use  . Smoking status: Never Smoker  . Smokeless tobacco: Never Used  Substance and Sexual Activity  . Alcohol use: No  . Drug use: No  . Sexual activity: Never  Other Topics Concern  . Not on file  Social History Narrative  . Not on file   Social Determinants of Health   Financial Resource Strain:   . Difficulty of Paying Living Expenses: Not on file   Food Insecurity:   . Worried About Charity fundraiser in the Last Year: Not on file  . Ran Out of Food in the Last Year: Not on file  Transportation Needs:   . Lack of Transportation (Medical): Not on file  . Lack of Transportation (Non-Medical): Not on file  Physical Activity:   . Days of Exercise per Week: Not on file  . Minutes of Exercise per Session: Not on file  Stress:   . Feeling of Stress : Not on file  Social Connections:   . Frequency of Communication with Friends and Family: Not on file  . Frequency of Social Gatherings with Friends and Family: Not on file  . Attends Religious Services: Not on file  . Active Member of Clubs or Organizations: Not on file  . Attends Archivist Meetings: Not on file  . Marital Status: Not on file  Intimate Partner Violence:   . Fear of Current or Ex-Partner: Not on file  . Emotionally Abused: Not on file  . Physically Abused: Not on file  . Sexually Abused: Not on file    Family History  Problem Relation Age of Onset  . Hypertension Mother   . Hyperlipidemia Mother   . Hypertension Father       There  is no immunization history on file for this patient.  Outpatient Encounter Medications as of 05/10/2019  Medication Sig  . amLODipine (NORVASC) 10 MG tablet Take 1 tablet (10 mg total) by mouth daily.  . Cholecalciferol 50 MCG (2000 UT) TABS Take by mouth.  Marland Kitchen ibuprofen (ADVIL,MOTRIN) 600 MG tablet Take 1 tablet (600 mg total) by mouth every 8 (eight) hours as needed for pain.  . vitamin B-12 (CYANOCOBALAMIN) 500 MCG tablet Take 500 mcg by mouth daily.  . vitamin C (ASCORBIC ACID) 250 MG tablet Take 250 mg by mouth daily.  . [DISCONTINUED] amLODipine (NORVASC) 10 MG tablet Take 10 mg by mouth daily.  . [DISCONTINUED] pantoprazole (PROTONIX) 40 MG tablet Take by mouth.  . [DISCONTINUED] diazepam (VALIUM) 5 MG tablet Take 1 tablet (5 mg total) by mouth every 6 (six) hours as needed (dizziness/vertigo). (Patient not taking:  Reported on 05/10/2019)  . [DISCONTINUED] gabapentin (NEURONTIN) 100 MG capsule Take by mouth.  . [DISCONTINUED] lisinopril-hydrochlorothiazide (PRINZIDE,ZESTORETIC) 20-25 MG per tablet Take 1 tablet by mouth daily.  . [DISCONTINUED] losartan-hydrochlorothiazide (HYZAAR) 100-25 MG tablet Take 1 tablet by mouth daily.  . [DISCONTINUED] meclizine (ANTIVERT) 12.5 MG tablet Take 1 tablet (12.5 mg total) by mouth 3 (three) times daily as needed for dizziness. (Patient not taking: Reported on 05/10/2019)   No facility-administered encounter medications on file as of 05/10/2019.     ROS: Gen: no fever, chills  Skin: no rash, itching ENT: no ear pain, ear drainage, nasal congestion, rhinorrhea, sinus pressure, sore throat Eyes: no blurry vision, double vision Resp: no cough, wheeze,SOB CV: no CP, palpitations, LE edema,  GI: no heartburn, n/v/d/c, abd pain GU: no dysuria, urgency, frequency, hematuria  MSK: no joint pain, myalgias, back pain Neuro: no dizziness, headache, weakness, vertigo Psych: no depression, anxiety, insomnia   No Known Allergies  BP 134/78   Pulse 79   Temp (!) 96.7 F (35.9 C) (Temporal)   Ht 5' 3.25" (1.607 m)   Wt 161 lb 3.2 oz (73.1 kg)   SpO2 97%   BMI 28.33 kg/m   Physical Exam  Constitutional: She is oriented to person, place, and time. She appears well-developed and well-nourished. No distress.  Cardiovascular: Normal rate, regular rhythm, normal heart sounds and intact distal pulses.  Pulmonary/Chest: Effort normal and breath sounds normal. No respiratory distress. She has no wheezes.  Musculoskeletal:        General: No edema. Normal range of motion.  Neurological: She is alert and oriented to person, place, and time.  Psychiatric: She has a normal mood and affect. Her behavior is normal.     A/P:  1. Encounter to establish care with new doctor - will be due in 12/2019 for CPE, fasting labs  2. Essential hypertension - controlled, at goal - cont  with regular activity and low sodium diet Refill: - amLODipine (NORVASC) 10 MG tablet; Take 1 tablet (10 mg total) by mouth daily.  Dispense: 90 tablet; Refill: 3  3. Influenza vaccination declined by patient  4. Pneumococcal vaccine refused

## 2019-05-10 NOTE — Patient Instructions (Addendum)
Health Maintenance Due  Topic Date Due  . TETANUS/TDAP  01/11/1960  . PNA vac Low Risk Adult (1 of 2 - PCV13)Pt declines getting injection 01/10/2005  . INFLUENZA VACCINE Pt declines 11/18/2018    Depression screen PHQ 2/9 12/06/2015  Decreased Interest 0  Down, Depressed, Hopeless 0  PHQ - 2 Score 0

## 2019-06-13 ENCOUNTER — Ambulatory Visit (HOSPITAL_COMMUNITY)
Admission: EM | Admit: 2019-06-13 | Discharge: 2019-06-13 | Disposition: A | Discharge status: Home / Self Care | Payer: 59 | Attending: Family Medicine | Admitting: Family Medicine

## 2019-06-13 ENCOUNTER — Encounter (HOSPITAL_COMMUNITY): Payer: Self-pay

## 2019-06-13 ENCOUNTER — Other Ambulatory Visit: Payer: Self-pay

## 2019-06-13 DIAGNOSIS — M722 Plantar fascial fibromatosis: Secondary | ICD-10-CM

## 2019-06-13 MED ORDER — PREDNISONE 10 MG (21) PO TBPK: ORAL_TABLET | Freq: Every day | ORAL | 0 refills | Status: AC

## 2019-06-13 MED ORDER — IBUPROFEN 800 MG PO TABS: 800.0000 mg | ORAL_TABLET | Freq: Three times a day (TID) | ORAL | 0 refills | Status: DC | PRN

## 2019-06-13 NOTE — Discharge Instructions (Signed)
Take the ibuprofen as prescribed.  Rest and elevate your foot.  Apply ice packs 2-3 times a day for up to 20 minutes each.  Wear the Ace wrap as needed for comfort.   ° °Follow up with your primary care provider or an orthopedist if you symptoms continue or worsen;  Or if you develop new symptoms, such as numbness, tingling, or weakness.   ° ° ° °

## 2019-06-13 NOTE — ED Provider Notes (Signed)
[facility]    CSN: [id number] Arrival date & time: 06/13/19  1256      History   Chief Complaint Chief Complaint  Patient presents with  . Foot Pain    HPI Karen [name] is a 79 y.o. female.   Reports L foot pain to plantar surface of the foot since yesterday. Has made no attempts to treat at home. Pain is worse when standing, better when resting. Denies injury, fever, body aches, rash, other symptoms.  ROS per HPI  The history is provided by the patient.    Past Medical History:  Diagnosis Date  . Hypertension     There are no problems to display for this patient.   History reviewed. No pertinent surgical history.  OB History   No obstetric history on file.      Home Medications    Prior to Admission medications   Medication Sig Start Date End Date Taking? Authorizing Provider  amLODipine (NORVASC) 10 MG tablet Take 1 tablet (10 mg total) by mouth daily. 05/10/19   Overton Mam, DO  Cholecalciferol 50 MCG (2000 UT) TABS Take by mouth. 09/05/18   [provider]  ibuprofen (ADVIL) 800 MG tablet Take 1 tablet (800 mg total) by mouth every 8 (eight) hours as needed for moderate pain. 06/13/19   Moshe Cipro, NP  predniSONE (STERAPRED UNI-PAK 21 TAB) 10 MG (21) TBPK tablet Take by mouth daily for 6 days. Take 6 tablets on day 1, 5 tablets on day 2, 4 tablets on day 3, 3 tablets on day 4, 2 tablets on day 5, 1 tablet on day 6 06/13/19 06/19/19  Moshe Cipro, NP  vitamin B-12 (CYANOCOBALAMIN) 500 MCG tablet Take 500 mcg by mouth daily.    [provider]  vitamin C (ASCORBIC ACID) 250 MG tablet Take 250 mg by mouth daily.    [provider]    Family History Family History  Problem Relation Age of Onset  . Hypertension Mother   . Hyperlipidemia Mother   . Hypertension Father     Social History Social History   Tobacco Use  . Smoking status: Never Smoker  . Smokeless tobacco: Never Used  Substance Use  Topics  . Alcohol use: No  . Drug use: No     Allergies   Patient has no known allergies.   Review of Systems Review of Systems   Physical Exam Triage Vital Signs ED Triage Vitals  Enc Vitals Group     BP      Pulse      Resp      Temp      Temp src      SpO2      Weight      Height      Head Circumference      Peak Flow      Pain Score      Pain Loc      Pain Edu?      Excl. in GC?    No data found.  Updated Vital Signs BP (!) 146/74 (BP Location: Left Arm)   Pulse 76   Temp 98.4 F (36.9 C) (Oral)   Resp 17   Wt 162 lb 3.2 oz (73.6 kg)   SpO2 97%   BMI 28.51 kg/m   Visual Acuity Right Eye Distance:   Left Eye Distance:   Bilateral Distance:    Right Eye Near:   Left Eye Near:    Bilateral Near:  Physical Exam Vitals and nursing note reviewed.  Constitutional:      General: She is not in acute distress.    Appearance: Normal appearance. She is well-developed.  HENT:     Head: Normocephalic and atraumatic.     Mouth/Throat:     Mouth: Mucous membranes are moist.  Eyes:     Conjunctiva/sclera: Conjunctivae normal.  Cardiovascular:     Rate and Rhythm: Normal rate and regular rhythm.     Heart sounds: Normal heart sounds. No murmur.  Pulmonary:     Effort: Pulmonary effort is normal. No respiratory distress.     Breath sounds: Normal breath sounds.  Abdominal:     General: Bowel sounds are normal.     Palpations: Abdomen is soft.     Tenderness: There is no abdominal tenderness.  Musculoskeletal:     Cervical back: Neck supple.     Left foot: Tenderness present.     Comments: Tenderness to plantar surface of L foot, from mid foot to ball of foot.  Skin:    General: Skin is warm and dry.  Neurological:     General: No focal deficit present.     Mental Status: She is alert and oriented to person, place, and time.  Psychiatric:        Mood and Affect: Mood normal.        Behavior: Behavior normal.      UC Treatments / Results    Labs (all labs ordered are listed, but only abnormal results are displayed) Labs Reviewed - No data to display  EKG   Radiology No results found.  Procedures Procedures (including critical care time)  Medications Ordered in UC Medications - No data to display  Initial Impression / Assessment and Plan / UC Course  I have reviewed the triage vital signs and the nursing notes.  Pertinent labs & imaging results that were available during my care of the patient were reviewed by me and considered in my medical decision making (see chart for details).     Presents with L foot pain x 1 day. Likely plantar fasciitis. Ibuprofen 800mg  and predpack sent to pharmacy. Instructed on when to follow up. Instructed on when to go to the ER. Final Clinical Impressions(s) / UC Diagnoses   Final diagnoses:  Plantar fasciitis of left foot     Discharge Instructions     Take the ibuprofen as prescribed.  Rest and elevate your foot.  Apply ice packs 2-3 times a day for up to 20 minutes each.  Wear the Ace wrap as needed for comfort.    Follow up with your primary care provider or an orthopedist if you symptoms continue or worsen;  Or if you develop new symptoms, such as numbness, tingling, or weakness.       ED Prescriptions    Medication Sig Dispense Auth. Provider   ibuprofen (ADVIL) 800 MG tablet Take 1 tablet (800 mg total) by mouth every 8 (eight) hours as needed for moderate pain. 21 tablet Faustino Congress, NP   predniSONE (STERAPRED UNI-PAK 21 TAB) 10 MG (21) TBPK tablet Take by mouth daily for 6 days. Take 6 tablets on day 1, 5 tablets on day 2, 4 tablets on day 3, 3 tablets on day 4, 2 tablets on day 5, 1 tablet on day 6 21 tablet Faustino Congress, NP     I have reviewed the PDMP during this encounter.   Faustino Congress, NP 06/13/19 1354

## 2019-06-13 NOTE — ED Triage Notes (Signed)
Pt is here with left ankle/foot pain that started yesterday, states she did not do anything to cause it. Pt has taken Advil to relieve discomfort, states it only happens when walking.

## 2019-06-15 ENCOUNTER — Ambulatory Visit: Payer: 59 | Admitting: Family Medicine

## 2019-08-21 NOTE — Progress Notes (Signed)
This visit is being conducted via phone call  - after an attmept to do on video chat - due to the COVID-19 pandemic. This patient has given me verbal consent via phone to conduct this visit, patient states they are participating from their home address. Some vital signs may be absent or patient reported.   Patient identification: identified by name, DOB, and current address.   Subjective:   Karen [name] is a 79 y.o. female who presents for an Initial Medicare Annual Wellness Visit.  Review of Systems    Cardiac Risk Factors include: advanced age (>42men, >20 women) Home Safety/Smoke Alarms: Feels safe in home. Smoke alarms in place.   Female:      Mammo- defer to PCP      Dexa scan-defer to PCP            CCS- declines    Objective:     Advanced Directives 08/22/2019 07/19/2016  Does Patient Have a Medical Advance Directive? No No  Would patient like information on creating a medical advance directive? No - Patient declined -    Current Medications (verified) Outpatient Encounter Medications as of 08/22/2019  Medication Sig  . amLODipine (NORVASC) 10 MG tablet Take 1 tablet (10 mg total) by mouth daily.  . Cholecalciferol 50 MCG (2000 UT) TABS Take by mouth.  Marland Kitchen ibuprofen (ADVIL) 800 MG tablet Take 1 tablet (800 mg total) by mouth every 8 (eight) hours as needed for moderate pain.  . vitamin B-12 (CYANOCOBALAMIN) 500 MCG tablet Take 500 mcg by mouth daily.  . vitamin C (ASCORBIC ACID) 250 MG tablet Take 250 mg by mouth daily.   No facility-administered encounter medications on file as of 08/22/2019.    Allergies (verified) Patient has no known allergies.   History: Past Medical History:  Diagnosis Date  . Hypertension    History reviewed. No pertinent surgical history. Family History  Problem Relation Age of Onset  . Hypertension Mother   . Hyperlipidemia Mother   . Hypertension Father    Social History   Socioeconomic History  . Marital status: Widowed    Spouse  name: Not on file  . Number of children: Not on file  . Years of education: Not on file  . Highest education level: Not on file  Occupational History  . Not on file  Tobacco Use  . Smoking status: Never Smoker  . Smokeless tobacco: Never Used  Substance and Sexual Activity  . Alcohol use: No  . Drug use: No  . Sexual activity: Not Currently    Birth control/protection: None  Other Topics Concern  . Not on file  Social History Narrative  . Not on file   Social Determinants of Health   Financial Resource Strain: Low Risk   . Difficulty of Paying Living Expenses: Not hard at all  Food Insecurity: No Food Insecurity  . Worried About Programme researcher, broadcasting/film/video in the Last Year: Never true  . Ran Out of Food in the Last Year: Never true  Transportation Needs: No Transportation Needs  . Lack of Transportation (Medical): No  . Lack of Transportation (Non-Medical): No  Physical Activity:   . Days of Exercise per Week:   . Minutes of Exercise per Session:   Stress:   . Feeling of Stress :   Social Connections:   . Frequency of Communication with Friends and Family:   . Frequency of Social Gatherings with Friends and Family:   . Attends Religious Services:   .  Active Member of Clubs or Organizations:   . Attends Banker Meetings:   Marland Kitchen Marital Status:     Tobacco Counseling Counseling given: Not Answered   Clinical Intake: Pain : No/denies pain    Activities of Daily Living In your present state of health, do you have any difficulty performing the following activities: 08/22/2019  Hearing? N  Vision? N  Difficulty concentrating or making decisions? N  Walking or climbing stairs? N  Dressing or bathing? N  Doing errands, shopping? Y  Preparing Food and eating ? N  Using the Toilet? N  In the past six months, have you accidently leaked urine? N  Do you have problems with loss of bowel control? N  Managing your Medications? N  Managing your Finances? N    Housekeeping or managing your Housekeeping? N  Some recent data might be hidden     Immunizations and Health Maintenance  There is no immunization history on file for this patient. Health Maintenance Due  Topic Date Due  . COVID-19 Vaccine (1) Never done  . TETANUS/TDAP  Never done  . PNA vac Low Risk Adult (1 of 2 - PCV13) Never done    Patient Care Team: Overton Mam, DO as PCP - General (Family Medicine)  Indicate any recent Medical Services you may have received from other than [facility] providers in the past year (date may be approximate).     Assessment:   This is a routine wellness examination for Karen [name]. Physical assessment deferred to PCP.  Hearing/Vision screen Unable to assess. This visit is enabled though telemedicine due to Covid 19.   Dietary issues and exercise activities discussed: Current Exercise Habits: Home exercise routine, Type of exercise: walking, Time (Minutes): 30, Frequency (Times/Week): 5, Weekly Exercise (Minutes/Week): 150, Exercise limited by: None identified Diet (meal preparation, eat out, water intake, caffeinated beverages, dairy products, fruits and vegetables): in general, a "healthy" diet  , well balanced    Goals    . DIET - INCREASE WATER INTAKE      Depression Screen PHQ 2/9 Scores 08/22/2019 12/06/2015  PHQ - 2 Score 0 0    Fall Risk Fall Risk  08/22/2019 05/10/2019 12/06/2015  Falls in the past year? 0 0 No  Number falls in past yr: 0 - -  Injury with Fall? 0 - -  Follow up Education provided;Falls prevention discussed - -    Cognitive Function: Ad8 score reviewed for issues:  Issues making decisions:no  Less interest in hobbies / activities:no  Repeats questions, stories (family complaining):no  Trouble using ordinary gadgets (microwave, computer, phone):no  Forgets the month or year: no  Mismanaging finances: no  Remembering appts:no  Daily problems with thinking and/or memory:no Ad8 score is=0          Screening Tests Health Maintenance  Topic Date Due  . COVID-19 Vaccine (1) Never done  . TETANUS/TDAP  Never done  . PNA vac Low Risk Adult (1 of 2 - PCV13) Never done  . INFLUENZA VACCINE  11/18/2019  . DEXA SCAN  Completed      Plan:    Please schedule your next medicare wellness visit with me in 1 yr.  Continue to eat heart healthy diet (full of fruits, vegetables, whole grains, lean protein, water--limit salt, fat, and sugar intake) and increase physical activity as tolerated.  Continue doing brain stimulating activities (puzzles, reading, adult coloring books, staying active) to keep memory sharp.   Bring a copy of your living will  and/or healthcare power of attorney to your next office visit.    I have personally reviewed and noted the following in the patient's chart:   . Medical and social history . Use of alcohol, tobacco or illicit drugs  . Current medications and supplements . Functional ability and status . Nutritional status . Physical activity . Advanced directives . List of other physicians . Hospitalizations, surgeries, and ER visits in previous 12 months . Vitals . Screenings to include cognitive, depression, and falls . Referrals and appointments  In addition, I have reviewed and discussed with patient certain preventive protocols, quality metrics, and best practice recommendations. A written personalized care plan for preventive services as well as general preventive health recommendations were provided to patient.     Shela Nevin, [place]   08/22/2019

## 2019-08-22 ENCOUNTER — Ambulatory Visit (INDEPENDENT_AMBULATORY_CARE_PROVIDER_SITE_OTHER): Payer: 59 | Admitting: *Deleted

## 2019-08-22 ENCOUNTER — Encounter: Payer: Self-pay | Admitting: *Deleted

## 2019-08-22 DIAGNOSIS — Z0000 Encounter for general adult medical examination without abnormal findings: Secondary | ICD-10-CM | POA: Diagnosis not present

## 2019-08-22 NOTE — Patient Instructions (Signed)
Please schedule your next medicare wellness visit with me in 1 yr.  Continue to eat heart healthy diet (full of fruits, vegetables, whole grains, lean protein, water--limit salt, fat, and sugar intake) and increase physical activity as tolerated.  Continue doing brain stimulating activities (puzzles, reading, adult coloring books, staying active) to keep memory sharp.   Bring a copy of your living will and/or healthcare power of attorney to your next office visit.   Karen [name] , Thank you for taking time to come for your Medicare Wellness Visit. I appreciate your ongoing commitment to your health goals. Please review the following plan we discussed and let me know if I can assist you in the future.   These are the goals we discussed: Goals    . DIET - INCREASE WATER INTAKE       This is a list of the screening recommended for you and due dates:  Health Maintenance  Topic Date Due  . COVID-19 Vaccine (1) Never done  . Tetanus Vaccine  Never done  . Pneumonia vaccines (1 of 2 - PCV13) Never done  . Flu Shot  11/18/2019  . DEXA scan (bone density measurement)  Completed    Preventive Care 65 Years and Older, Female Preventive care refers to lifestyle choices and visits with your health care provider that can promote health and wellness. This includes:  A yearly physical exam. This is also called an annual well check.  Regular dental and eye exams.  Immunizations.  Screening for certain conditions.  Healthy lifestyle choices, such as diet and exercise. What can I expect for my preventive care visit? Physical exam Your health care provider will check:  Height and weight. These may be used to calculate body mass index (BMI), which is a measurement that tells if you are at a healthy weight.  Heart rate and blood pressure.  Your skin for abnormal spots. Counseling Your health care provider may ask you questions about:  Alcohol, tobacco, and drug use.  Emotional  well-being.  Home and relationship well-being.  Sexual activity.  Eating habits.  History of falls.  Memory and ability to understand (cognition).  Work and work Statistician.  Pregnancy and menstrual history. What immunizations do I need?  Influenza (flu) vaccine  This is recommended every year. Tetanus, diphtheria, and pertussis (Tdap) vaccine  You may need a Td booster every 10 years. Varicella (chickenpox) vaccine  You may need this vaccine if you have not already been vaccinated. Zoster (shingles) vaccine  You may need this after age 28. Pneumococcal conjugate (PCV13) vaccine  One dose is recommended after age 9. Pneumococcal polysaccharide (PPSV23) vaccine  One dose is recommended after age 76. Measles, mumps, and rubella (MMR) vaccine  You may need at least one dose of MMR if you were born in 1957 or later. You may also need a second dose. Meningococcal conjugate (MenACWY) vaccine  You may need this if you have certain conditions. Hepatitis A vaccine  You may need this if you have certain conditions or if you travel or work in places where you may be exposed to hepatitis A. Hepatitis B vaccine  You may need this if you have certain conditions or if you travel or work in places where you may be exposed to hepatitis B. Haemophilus influenzae type b (Hib) vaccine  You may need this if you have certain conditions. You may receive vaccines as individual doses or as more than one vaccine together in one shot (combination vaccines). Talk with  your health care provider about the risks and benefits of combination vaccines. What tests do I need? Blood tests  Lipid and cholesterol levels. These may be checked every 5 years, or more frequently depending on your overall health.  Hepatitis C test.  Hepatitis B test. Screening  Lung cancer screening. You may have this screening every year starting at age 17 if you have a 30-pack-year history of smoking and  currently smoke or have quit within the past 15 years.  Colorectal cancer screening. All adults should have this screening starting at age 56 and continuing until age 92. Your health care provider may recommend screening at age 40 if you are at increased risk. You will have tests every 1-10 years, depending on your results and the type of screening test.  Diabetes screening. This is done by checking your blood sugar (glucose) after you have not eaten for a while (fasting). You may have this done every 1-3 years.  Mammogram. This may be done every 1-2 years. Talk with your health care provider about how often you should have regular mammograms.  BRCA-related cancer screening. This may be done if you have a family history of breast, ovarian, tubal, or peritoneal cancers. Other tests  Sexually transmitted disease (STD) testing.  Bone density scan. This is done to screen for osteoporosis. You may have this done starting at age 24. Follow these instructions at home: Eating and drinking  Eat a diet that includes fresh fruits and vegetables, whole grains, lean protein, and low-fat dairy products. Limit your intake of foods with high amounts of sugar, saturated fats, and salt.  Take vitamin and mineral supplements as recommended by your health care provider.  Do not drink alcohol if your health care provider tells you not to drink.  If you drink alcohol: ? Limit how much you have to 0-1 drink a day. ? Be aware of how much alcohol is in your drink. In the [place] one drink equals one 12 oz bottle of beer (355 mL), one 5 oz glass of wine (148 mL), or one 1 oz glass of hard liquor (44 mL). Lifestyle  Take daily care of your teeth and gums.  Stay active. Exercise for at least 30 minutes on 5 or more days each week.  Do not use any products that contain nicotine or tobacco, such as cigarettes, e-cigarettes, and chewing tobacco. If you need help quitting, ask your health care provider.  If you are  sexually active, practice safe sex. Use a condom or other form of protection in order to prevent STIs (sexually transmitted infections).  Talk with your health care provider about taking a low-dose aspirin or statin. What's next?  Go to your health care provider once a year for a well check visit.  Ask your health care provider how often you should have your eyes and teeth checked.  Stay up to date on all vaccines. This information is not intended to replace advice given to you by your health care provider. Make sure you discuss any questions you have with your health care provider. Document Revised: 03/30/2018 Document Reviewed: 03/30/2018 Elsevier Patient Education  2020 [employer].

## 2019-08-30 ENCOUNTER — Other Ambulatory Visit: Payer: Self-pay

## 2019-08-31 ENCOUNTER — Ambulatory Visit (INDEPENDENT_AMBULATORY_CARE_PROVIDER_SITE_OTHER): Payer: 59 | Admitting: Family Medicine

## 2019-08-31 ENCOUNTER — Encounter: Payer: Self-pay | Admitting: Family Medicine

## 2019-08-31 VITALS — BP 148/80 | Temp 97.2°F | Ht 63.25 in | Wt 158.8 lb

## 2019-08-31 DIAGNOSIS — I10 Essential (primary) hypertension: Secondary | ICD-10-CM

## 2019-08-31 DIAGNOSIS — R209 Unspecified disturbances of skin sensation: Secondary | ICD-10-CM

## 2019-08-31 NOTE — Progress Notes (Signed)
Karen [name] is a 79 y.o. female  Chief Complaint  Patient presents with  . feet numbness    Pt c/o feet swelling, numbness and heaviness in both feet, at night when pt is in bed.  x2 months.    HPI: Karen [name] is a 79 y.o. female who complains of 67mo h/o "heaviness" in her feet and toes. No numbness or tingling. No pain. No burning sensation. Symptoms only occur at night when she lays down in bed, never during the day. She endorses intermittent swelling in B/L LE. No swelling in AM. Swelling is not consistent/not daily but feeling of "heaviness" occurs nightly. Symptoms do not interfere with sleep. No weakness in LE. No cramping. They do not feel cold or hot.   She is taking norvasc 10mg  daily. She states previous PCP has her on another med that she is not taking and "doesn't trust it". From review of records, it may be losartan/HCTZ. She does not have Rx with her but will call or send picture when she gets home.   Past Medical History:  Diagnosis Date  . Hypertension     History reviewed. No pertinent surgical history.  Social History   Socioeconomic History  . Marital status: Widowed    Spouse name: Not on file  . Number of children: Not on file  . Years of education: Not on file  . Highest education level: Not on file  Occupational History  . Not on file  Tobacco Use  . Smoking status: Never Smoker  . Smokeless tobacco: Never Used  Substance and Sexual Activity  . Alcohol use: No  . Drug use: No  . Sexual activity: Not Currently    Birth control/protection: None  Other Topics Concern  . Not on file  Social History Narrative  . Not on file   Social Determinants of Health   Financial Resource Strain: Low Risk   . Difficulty of Paying Living Expenses: Not hard at all  Food Insecurity: No Food Insecurity  . Worried About Charity fundraiser in the Last Year: Never true  . Ran Out of Food in the Last Year: Never true  Transportation Needs: No Transportation  Needs  . Lack of Transportation (Medical): No  . Lack of Transportation (Non-Medical): No  Physical Activity:   . Days of Exercise per Week:   . Minutes of Exercise per Session:   Stress:   . Feeling of Stress :   Social Connections:   . Frequency of Communication with Friends and Family:   . Frequency of Social Gatherings with Friends and Family:   . Attends Religious Services:   . Active Member of Clubs or Organizations:   . Attends Archivist Meetings:   Marland Kitchen Marital Status:   Intimate Partner Violence:   . Fear of Current or Ex-Partner:   . Emotionally Abused:   Marland Kitchen Physically Abused:   . Sexually Abused:     Family History  Problem Relation Age of Onset  . Hypertension Mother   . Hyperlipidemia Mother   . Hypertension Father       There is no immunization history on file for this patient.  Outpatient Encounter Medications as of 08/31/2019  Medication Sig  . amLODipine (NORVASC) 10 MG tablet Take 1 tablet (10 mg total) by mouth daily.  . Cholecalciferol 50 MCG (2000 UT) TABS Take by mouth.  Marland Kitchen ibuprofen (ADVIL) 800 MG tablet Take 1 tablet (800 mg total) by mouth every 8 (eight) hours as  needed for moderate pain.  . vitamin B-12 (CYANOCOBALAMIN) 500 MCG tablet Take 500 mcg by mouth daily.  . vitamin C (ASCORBIC ACID) 250 MG tablet Take 250 mg by mouth daily.   No facility-administered encounter medications on file as of 08/31/2019.     ROS: Pertinent positives and negatives noted in HPI. Remainder of ROS non-contributory    No Known Allergies  BP (!) 148/80 (BP Location: Left Arm, Patient Position: Sitting, Cuff Size: Normal)   Temp (!) 97.2 F (36.2 C) (Temporal)   Ht 5' 3.25" (1.607 m)   Wt 158 lb 12.8 oz (72 kg)   BMI 27.91 kg/m   BP Readings from Last 3 Encounters:  08/31/19 (!) 148/80  06/13/19 (!) 146/74  05/10/19 134/78     Physical Exam  Constitutional: She is oriented to person, place, and time. She appears well-developed and  well-nourished. No distress.  Cardiovascular: Normal rate, regular rhythm and normal pulses.  Musculoskeletal:        General: No tenderness, deformity or edema. Normal range of motion.  Neurological: She is alert and oriented to person, place, and time. She has normal reflexes. No sensory deficit. She exhibits normal muscle tone. Coordination normal.  Psychiatric: She has a normal mood and affect. Her behavior is normal.     A/P:  1. Essential hypertension - not at goal - pt taking norvasc 10mg  daily - she was apparently Rx'd a second med (? Losartan/hctz) but is not taking and is not sure that is the correct med or what dose was. She will call when she gets home with that info  2. Altered sensation of foot - "heaviness" in distal part of foot and toes, nightly, no associated symptoms x 2 mo. Pt is able to sleep, no pain, she can ambulate w/o issue. No associated swelling, temp changes, skin changes, numbness or paresthesias, cramping - normal exam - unclear etiology but pt states she has not been walking regularly the way she used to. She will resume her daily walking and see if this helps - f/u PRN - could consider labs, dopplers, etc

## 2019-09-06 ENCOUNTER — Telehealth: Payer: Self-pay | Admitting: Family Medicine

## 2019-09-06 DIAGNOSIS — R600 Localized edema: Secondary | ICD-10-CM

## 2019-09-06 DIAGNOSIS — I10 Essential (primary) hypertension: Secondary | ICD-10-CM

## 2019-09-06 MED ORDER — LOSARTAN POTASSIUM-HCTZ 100-25 MG PO TABS: 1.0000 | ORAL_TABLET | Freq: Every day | ORAL | 3 refills | Status: DC

## 2019-09-06 NOTE — Telephone Encounter (Signed)
Called and spoke with pt. Her son is no longer at the house. She states [facility] physician came to house to do her annual checkup and wants her to stop taking amlodipine 10mg  daily as it may be causing her intermittent LE edema. Pt has Rx for Losartan/HCTZ 100/25 1 tab daily that she was Rx'd as well but has not been taking. I advised her ok to stop norvasc and start losartan/HCTZ 100/25mg  daily and I will send new Rx to pharm. Pt will also come info lab appt, orders placed.  Front staff will call pt to schedule lab appt.

## 2019-09-06 NOTE — Telephone Encounter (Signed)
Pt son calling and saying that pt [facility] Doctor Dawson Bills [phone] , who came to do her yearly evaluation said she is taking amLODipine (NORVASC) 10 MG tablet and thinks it is the cause of her legs swelling and recommended she be switched to Losartan. He Recommended these labs be dione: BUN, EGFR for kidney BNP for Heart muscle stretch, UA for urine. The doctor is going to send a copy of summary to Dr Bryan Lemma. Pt requested call from provider because its been 3 days since she last took blood pressure pill. Please advise, pt number [phone]

## 2019-09-10 ENCOUNTER — Other Ambulatory Visit: Payer: Self-pay

## 2019-09-10 ENCOUNTER — Telehealth (INDEPENDENT_AMBULATORY_CARE_PROVIDER_SITE_OTHER): Payer: 59 | Admitting: Family

## 2019-09-10 ENCOUNTER — Encounter (HOSPITAL_COMMUNITY): Payer: Self-pay

## 2019-09-10 ENCOUNTER — Emergency Department (HOSPITAL_COMMUNITY)
Admission: EM | Admit: 2019-09-10 | Discharge: 2019-09-10 | Disposition: A | Discharge status: Home / Self Care | Payer: 59 | Attending: Emergency Medicine | Admitting: Emergency Medicine

## 2019-09-10 ENCOUNTER — Emergency Department (HOSPITAL_COMMUNITY): Payer: 59

## 2019-09-10 DIAGNOSIS — R55 Syncope and collapse: Secondary | ICD-10-CM | POA: Diagnosis present

## 2019-09-10 DIAGNOSIS — I10 Essential (primary) hypertension: Secondary | ICD-10-CM

## 2019-09-10 DIAGNOSIS — R2243 Localized swelling, mass and lump, lower limb, bilateral: Secondary | ICD-10-CM | POA: Diagnosis not present

## 2019-09-10 DIAGNOSIS — R32 Unspecified urinary incontinence: Secondary | ICD-10-CM | POA: Insufficient documentation

## 2019-09-10 DIAGNOSIS — Z79899 Other long term (current) drug therapy: Secondary | ICD-10-CM | POA: Insufficient documentation

## 2019-09-10 DIAGNOSIS — R5383 Other fatigue: Secondary | ICD-10-CM

## 2019-09-10 LAB — URINALYSIS, ROUTINE W REFLEX MICROSCOPIC
Bilirubin Urine: NEGATIVE
Glucose, UA: NEGATIVE mg/dL
Ketones, ur: NEGATIVE mg/dL
Leukocytes,Ua: NEGATIVE
Nitrite: NEGATIVE
Protein, ur: NEGATIVE mg/dL
Specific Gravity, Urine: 1.013 (ref 1.005–1.030)
pH: 5 (ref 5.0–8.0)

## 2019-09-10 LAB — BASIC METABOLIC PANEL
Anion gap: 10 (ref 5–15)
BUN: 20 mg/dL (ref 8–23)
CO2: 29 mmol/L (ref 22–32)
Calcium: 9.3 mg/dL (ref 8.9–10.3)
Chloride: 102 mmol/L (ref 98–111)
Creatinine, Ser: 0.77 mg/dL (ref 0.44–1.00)
GFR calc Af Amer: >60 mL/min (ref >60)
GFR calc non Af Amer: >60 mL/min (ref >60)
Glucose, Bld: 100 mg/dL — ABNORMAL HIGH (ref 70–99)
Potassium: 3.5 mmol/L (ref 3.5–5.1)
Sodium: 141 mmol/L (ref 135–145)

## 2019-09-10 LAB — CBG MONITORING, ED: Glucose-Capillary: 108 mg/dL — ABNORMAL HIGH (ref 70–99)

## 2019-09-10 LAB — CBC
HCT: 37.6 % (ref 36.0–46.0)
Hemoglobin: 11.9 g/dL — ABNORMAL LOW (ref 12.0–15.0)
MCH: 27.6 pg (ref 26.0–34.0)
MCHC: 31.6 g/dL (ref 30.0–36.0)
MCV: 87.2 fL (ref 80.0–100.0)
Platelets: 188 10*3/uL (ref 150–400)
RBC: 4.31 MIL/uL (ref 3.87–5.11)
RDW: 14.1 % (ref 11.5–15.5)
WBC: 7.2 10*3/uL (ref 4.0–10.5)
nRBC: 0 % (ref 0.0–0.2)

## 2019-09-10 MED ORDER — SODIUM CHLORIDE 0.9% FLUSH: 3.0000 mL | Freq: Once | INTRAVENOUS | Status: DC

## 2019-09-10 NOTE — ED Notes (Signed)
Claudia, PA made aware of increasing BP trend.

## 2019-09-10 NOTE — ED Triage Notes (Signed)
Patient states she passed out 2 days ago. patient states she has had increased tiredness x 3 days.  Patient states she did not hit her head when she passed out.

## 2019-09-10 NOTE — ED Notes (Signed)
Per PA patient may eat or drink.   Patient given water.

## 2019-09-10 NOTE — Patient Instructions (Signed)
Advised daughter to take her mother to the ED

## 2019-09-10 NOTE — Discharge Instructions (Signed)
You were seen in the ER for passing out episode  Lab work, EKG, head CT, were all normal.  Urine had some bacteria in it but we sent it to the lab to rule out an infection  The cause of your passing out episode is not clear but since everything was normal you are being discharged and encouraged to follow up with your doctor for more discussion.  Your blood pressure is not very well controlled at the moment. Your doctor may refer you to a cardiologist or neurologist.   Return to the ER for recurrent passing out, seizure or convulsion, chest pain, shortness of breath, palpitations, leg swelling  Rest. Stay hydrated. Eat well. Take all medicines as prescribed

## 2019-09-10 NOTE — ED Provider Notes (Addendum)
[facility]-EMERGENCY DEPT Provider Note   CSN: [id number] Arrival date & time: 09/10/19  1344     History Chief Complaint  Patient presents with   Fatigue   Loss of Consciousness    Karen [name] is a 79 y.o. female with history of hypertension presents to the ER for evaluation of passing out episode that occurred 2 days ago.  Patient had telemedicine visit with PCP who recommended ER evaluation for this.  Patient states on Saturday she went to [place] with her daughter.  She had sudden onset of lightheadedness and her vision went black.  She sat on a chair and remembers people talking to her and drinking water.  States she was aware of everything and did not think she passed out.  States things looked "fuzzy" but she was aware of everything going on around her.  However, her daughter told her that she passed out, and her body jerked and also urinated on herself.  EMS/ambulance was called.  Patient states they did an EKG and checked her glucose and everything looked normal.  She did not want to come to the hospital at that time.  Since Saturday she has felt like she has no energy to do anything but otherwise feels normal. Wonders if maybe it is related to being depressed because she is having issues with her children and a difficult time with her daughter.  States that she is very exhausted from taking care of her 32-year-old grandson.  She feels like it is too much for her but is afraid to tell her daughter that she cannot take care of him anymore because she has a temper.  Feels like she is not taking care of her self, eating well or resting.  States 20 years ago she had a passing out episode that was related to anemia.  States otherwise she is very healthy and has never been hospitalized.  Of note, for the last several weeks she has noticed intermittent leg swelling and heaviness.  She had a PCP appointment but was told that everything looked okay.  She had a visit from a  doctor making house calls who told her one of the side effects of her blood pressure medicine caused leg swelling so this was recently switched.  She is not taking amlodipine.  Denies any recent illnesses.  No fevers.  Denies associated chest pain, palpitations, shortness of breath.  No recent vomiting, diarrhea.  No recent dysuria or hematuria.  Denies significant alcohol use.  Karen [name] daughter [phone] was with her.  HPI     Past Medical History:  Diagnosis Date   Hypertension     There are no problems to display for this patient.   History reviewed. No pertinent surgical history.   OB History   No obstetric history on file.     Family History  Problem Relation Age of Onset   Hypertension Mother    Hyperlipidemia Mother    Hypertension Father     Social History   Tobacco Use   Smoking status: Never Smoker   Smokeless tobacco: Never Used  Substance Use Topics   Alcohol use: No   Drug use: No    Home Medications Prior to Admission medications   Medication Sig Start Date End Date Taking? Authorizing Provider  Cholecalciferol 50 MCG (2000 UT) TABS Take 2,000 Units by mouth daily.  09/05/18  Yes [provider]  ibuprofen (ADVIL) 800 MG tablet Take 1 tablet (800 mg total) by mouth every  8 (eight) hours as needed for moderate pain. 06/13/19  Yes Moshe Cipro, NP  losartan-hydrochlorothiazide (HYZAAR) 100-25 MG tablet Take 1 tablet by mouth daily. 09/06/19  Yes Cirigliano, Mary K, DO  vitamin C (ASCORBIC ACID) 250 MG tablet Take 250 mg by mouth daily.   Yes [provider]    Allergies    Patient has no known allergies.  Review of Systems   Review of Systems  Constitutional: Positive for fatigue.  Neurological: Positive for dizziness and seizures.  All other systems reviewed and are negative.   Physical Exam Updated Vital Signs BP (!) 156/109    Pulse 62    Temp 98.3 F (36.8 C) (Oral)    Resp 15    Ht 5\' 3"  (1.6 m)    Wt  72.6 kg    SpO2 100%    BMI 28.34 kg/m   Physical Exam Vitals and nursing note reviewed.  Constitutional:      General: She is not in acute distress.    Appearance: She is well-developed.     Comments: NAD.  HENT:     Head: Normocephalic and atraumatic.     Right Ear: External ear normal.     Left Ear: External ear normal.     Nose: Nose normal.  Eyes:     Conjunctiva/sclera: Conjunctivae normal.  Cardiovascular:     Rate and Rhythm: Normal rate and regular rhythm.     Heart sounds: Normal heart sounds.     Comments: 1+ radial and DP pulses bilaterally.  No lower extremity edema. Pulmonary:     Effort: Pulmonary effort is normal.     Breath sounds: Normal breath sounds.  Musculoskeletal:        General: Normal range of motion.     Cervical back: Normal range of motion and neck supple.  Skin:    General: Skin is warm and dry.     Capillary Refill: Capillary refill takes less than 2 seconds.  Neurological:     Mental Status: She is alert and oriented to person, place, and time.     Comments:  Alert and oriented to self, place, time and event.  Speech is fluent without dysarthria or dysphasia. Strength 5/5 with hand grip and ankle F/E.   Sensation to light touch intact in face, hands and feet. Normal gait around the bed. No pronator drift. No leg drop. Normal finger-to-nose.   CN I not tested CN II grossly intact visual fields bilaterally. Unable to visualize posterior eye. CN III, IV, VI PEERL and EOMs intact bilaterally CN V light touch intact in all 3 divisions of trigeminal nerve CN VII facial movements symmetric CN VIII not tested CN IX, X no uvula deviation, symmetric rise of soft palate  CN XI 5/5 SCM and trapezius strength bilaterally  CN XII Midline tongue protrusion, symmetric L/R movements  Psychiatric:        Behavior: Behavior normal.        Thought Content: Thought content normal.        Judgment: Judgment normal.     ED Results / Procedures /  Treatments   Labs (all labs ordered are listed, but only abnormal results are displayed) Labs Reviewed  BASIC METABOLIC PANEL - Abnormal; Notable for the following components:      Result Value   Glucose, Bld 100 (*)    All other components within normal limits  CBC - Abnormal; Notable for the following components:   Hemoglobin 11.9 (*)  All other components within normal limits  URINALYSIS, ROUTINE W REFLEX MICROSCOPIC - Abnormal; Notable for the following components:   Hgb urine dipstick MODERATE (*)    Bacteria, UA RARE (*)    All other components within normal limits  CBG MONITORING, ED - Abnormal; Notable for the following components:   Glucose-Capillary 108 (*)    All other components within normal limits  URINE CULTURE    EKG EKG Interpretation  Date/Time:  Monday Sep 10 2019 14:30:02 EDT Ventricular Rate:  61 PR Interval:    QRS Duration: 90 QT Interval:  419 QTC Calculation: 422 R Axis:   53 Text Interpretation: Sinus rhythm Abnormal R-wave progression, early transition No significant change since last tracing Confirmed by Alvira Monday ([id number]) on 09/10/2019 5:25:44 PM   Radiology CT Head Wo Contrast  Result Date: 09/10/2019 CLINICAL DATA:  Seizure versus syncope. EXAM: CT HEAD WITHOUT CONTRAST TECHNIQUE: Contiguous axial images were obtained from the base of the skull through the vertex without intravenous contrast. COMPARISON:  None. FINDINGS: Brain: There is mild cerebral atrophy with widening of the extra-axial spaces and ventricular dilatation. There are areas of decreased attenuation within the white matter tracts of the supratentorial brain, consistent with microvascular disease changes. Vascular: No hyperdense vessel or unexpected calcification. Skull: Normal. Negative for fracture or focal lesion. Sinuses/Orbits: No acute finding. Other: None. IMPRESSION: No acute intracranial pathology. Electronically Signed   By: Aram Candela M.D.   On: 09/10/2019  16:49    Procedures Procedures (including critical care time)  Medications Ordered in ED Medications  sodium chloride flush (NS) 0.9 % injection 3 mL (has no administration in time range)    ED Course  I have reviewed the triage vital signs and the nursing notes.  Pertinent labs & imaging results that were available during my care of the patient were reviewed by me and considered in my medical decision making (see chart for details).  Clinical Course as of Sep 09 1753  Mon Sep 10, 2019  1620 Patient has no UTI symptoms.  No nitrates, leukocytes, WBCs, RBCs.  Will send for culture.  Bacteria, UA(!): RARE [CG]  1620 Hemoglobin(!): 11.9 [CG]  1620 Glucose(!): 100 [CG]  1620 Recent medication change.  Patient took her medicine this morning.  States usually her blood pressure is in the 120s systolic.  Denies any headache, vision changes, chest pain, back or abdominal pain.  Reports intermittent leg swelling at home but none currently.  BP(!): 186/70 [CG]    Clinical Course User Index [CG] Liberty Handy, PA-C   MDM Rules/Calculators/A&P                      Patient's EMR and nursing notes reviewed to obtain pertinent history and assist with MDM.  I obtained more information from patient's daughter Shanda Bumps who states patient told her her vision got dark and she felt lightheaded.  Patient sat down and went unconscious for only a few seconds.  Daughter noticed from her waist up patient was "jerking" and she had urinated on herself.  This lasted only a few seconds and patient eventually came to.  Patient was fully oriented when she woke up.   Exam benign.  Initially insert this has improved without intervention.  She has no headache, vision changes, lower extremity edema to suggest hypertensive emergency.  Her neuro exam is normal.  Labs, urinalysis, EKG obtained in triage.  We will add a CT head.  1750: ER work-up personally reviewed  and interpreted, very benign.  No significant  anemia.  No electrolyte urinalysis with bacteriuria but otherwise normal.  She has no UTI symptoms, will send for culture.  She does not show any ischemia, arrhythmias or blocks.  Head CT obtained given possible "jerking" by daughter.  Patient has no history of seizures, neuro deficits and actually states she was awake the entire time and aware of her surroundings.  CT head is nonacute.  Patient was monitored in the ER.  She is ambulating here, drinking and eating.  Vital signs are acceptable.  Patient discussed with EDP.  At this time deemed appropriate to be discharged with outpatient work-up for syncope.  Favoring vasovagal/orthostatic syncope.  Considered cardiac or neurological process less likely.  Explained this to patient who is comfortable being discharged.  She has close follow-up with PCP.  Encouraged her to call her PCP to make an appointment, consider cardiology follow-up.  Strict return precautions discussed with patient who is agreement with current treatment and discharge plan.  Final Clinical Impression(s) / ED Diagnoses Final diagnoses:  Syncope and collapse  Elevated blood pressure reading with diagnosis of hypertension    Rx / DC Orders ED Discharge Orders    None         Kinnie Feil, PA-C 09/10/19 1755    Gareth Morgan, MD 09/11/19 [phone]

## 2019-09-10 NOTE — Progress Notes (Signed)
I connected with  Karen [name] on 09/10/19 by a video enabled telemedicine application and verified that I am speaking with the correct person using two identifiers.   I discussed the limitations of evaluation and management by telemedicine. The patient expressed understanding and agreed to proceed.  Virtual Visit via Telephone Note  I connected with Karen [name] on 09/10/19 at  2:00 PM EDT by telephone and verified that I am speaking with the correct person using two identifiers.  Location: Patient: Home with daughter, spoke with daughter on the phone Provider: Yolanda Manges     I discussed the limitations, risks, security and privacy concerns of performing an evaluation and management service by telephone and the availability of in person appointments. I also discussed with the patient that there may be a patient responsible charge related to this service. The patient expressed understanding and agreed to proceed.   History of Present Illness:History obtained from the daughter. Daughter reports being in [place] with her mother on Saturday, mom complained of feeling weak, she passed out and appeared to be convulsing. She reports EMS arriving and she decided to take her mother home after she begin to come around and reestablish consciousness. Today, daughter reports mom calling her to tell her that she does not feel well. She left work to check on her mother and she is weak and fatigued again. Daughter reports blood pressure medication was changed from Amlodipine to Losartan HCT recently. Blood pressure has been stable. Daughter is requesting that her mother get labs done in the office.     Observations/Objective:   Assessment and Plan: Fatigue/Weakness, Hypertension    Follow Up Instructions: Advised daughter to take her mother into the ED for further evaluation. Daughter again states, "we just need labs." I explained that labs would take longer through the primary care office and we  need to identify what is causing her mother to not feel well and pass out, today. Daughter not happy with the outcome but says "ok." I am concerned that something serious may be underlying with the seizure-like described activity that occurred on Saturday.    I discussed the assessment and treatment plan with the patient. The patient was provided an opportunity to ask questions and all were answered. The patient agreed with the plan and demonstrated an understanding of the instructions.   The patient was advised to call back or seek an in-person evaluation if the symptoms worsen or if the condition fails to improve as anticipated.  I provided 20 minutes of non-face-to-face time during this encounter.   Eulis Foster, FNP

## 2019-09-12 ENCOUNTER — Other Ambulatory Visit: Payer: Self-pay

## 2019-09-12 LAB — URINE CULTURE

## 2019-09-13 ENCOUNTER — Telehealth: Payer: Self-pay | Admitting: Family Medicine

## 2019-09-13 ENCOUNTER — Encounter: Payer: Self-pay | Admitting: Nurse Practitioner

## 2019-09-13 ENCOUNTER — Ambulatory Visit (INDEPENDENT_AMBULATORY_CARE_PROVIDER_SITE_OTHER): Payer: 59 | Admitting: Nurse Practitioner

## 2019-09-13 VITALS — BP 132/88 | HR 72 | Temp 96.8°F | Ht 63.0 in | Wt 158.8 lb

## 2019-09-13 DIAGNOSIS — R42 Dizziness and giddiness: Secondary | ICD-10-CM

## 2019-09-13 MED ORDER — MECLIZINE HCL 12.5 MG PO TABS
12.5000 mg | ORAL_TABLET | Freq: Two times a day (BID) | ORAL | 0 refills | Status: DC | PRN
Start: 2019-09-13 — End: 2020-09-10

## 2019-09-13 NOTE — Telephone Encounter (Signed)
Patient son wanted to see if a referral be put in for patient to see a neurologist. Informed patient that Dr. Salena Saner is out of the office this week and it may be next week before he hears anything back. CB is [phone]

## 2019-09-13 NOTE — Patient Instructions (Addendum)
Call office if no improvement by Monday. Consider PT referral if no improvement. BP is normal today Advised to maintain adequate oral hydration  Vertigo Vertigo is the feeling that you or the things around you are moving when they are not. This feeling can come and go at any time. Vertigo often goes away on its own. This condition can be dangerous if it happens when you are doing activities like driving or working with machines. Your doctor will do tests to find the cause of your vertigo. These tests will also help your doctor decide on the best treatment for you. Follow these instructions at home: Eating and drinking      Drink enough fluid to keep your pee (urine) pale yellow.  Do not drink alcohol. Activity  Return to your normal activities as told by your doctor. Ask your doctor what activities are safe for you.  In the morning, first sit up on the side of the bed. When you feel okay, stand slowly while you hold onto something until you know that your balance is fine.  Move slowly. Avoid sudden body or head movements or certain positions, as told by your doctor.  Use a cane if you have trouble standing or walking.  Sit down right away if you feel dizzy.  Avoid doing any tasks or activities that can cause danger to you or others if you get dizzy.  Avoid bending down if you feel dizzy. Place items in your home so that they are easy for you to reach without leaning over.  Do not drive or use heavy machinery if you feel dizzy. General instructions  Take over-the-counter and prescription medicines only as told by your doctor.  Keep all follow-up visits as told by your doctor. This is important. Contact a doctor if:  Your medicine does not help your vertigo.  You have a fever.  Your problems get worse or you have new symptoms.  Your family or friends see changes in your behavior.  The feeling of being sick to your stomach gets worse.  Your vomiting gets worse.  You  lose feeling (have numbness) in part of your body.  You feel prickling and tingling in a part of your body. Get help right away if:  You have trouble moving or talking.  You are always dizzy.  You pass out (faint).  You get very bad headaches.  You feel weak in your hands, arms, or legs.  You have changes in your hearing.  You have changes in how you see (vision).  You get a stiff neck.  Bright light starts to bother you. Summary  Vertigo is the feeling that you or the things around you are moving when they are not.  Your doctor will do tests to find the cause of your vertigo.  You may be told to avoid some tasks, positions, or movements.  Contact a doctor if your medicine is not helping, or if you have a fever, new symptoms, or a change in behavior.  Get help right away if you get very bad headaches, or if you have changes in how you speak, hear, or see. This information is not intended to replace advice given to you by your health care provider. Make sure you discuss any questions you have with your health care provider. Document Revised: 02/27/2018 Document Reviewed: 02/27/2018 Elsevier Patient Education  2020 [employer].

## 2019-09-13 NOTE — Progress Notes (Signed)
Subjective:  Patient ID: Karen [name], female    DOB: Sep 24, 1940  Age: 79 y.o. MRN: [id number]  CC: Dizziness (pt very dizzy since Saturday an she sees dark flakes//saturday went to ER said she checked out checked heart-EKG and sugar and everything was normal//pt said said 2 years ago she had bad vertigo and had to have medicine//pt very unstable today//Dr. C also has labs for her in from 5/20 )  Dizziness This is a recurrent problem. The current episode started in the past 7 days. The problem occurs constantly. The problem has been unchanged. Associated symptoms include vertigo and a visual change. Pertinent negatives include no anorexia, chills, congestion, myalgias, nausea or weakness. The symptoms are aggravated by walking. She has tried rest for the symptoms. The treatment provided mild relief.  reviewed ED notes, lab results and CT head report.( Had similar episode 2018 and 2019, MRI brain completed (normal), treated with diazepam.  Reviewed past Medical, Social and Family history today.  Outpatient Medications Prior to Visit  Medication Sig Dispense Refill  . Cholecalciferol 50 MCG (2000 UT) TABS Take 2,000 Units by mouth daily.     Marland Kitchen losartan-hydrochlorothiazide (HYZAAR) 100-25 MG tablet Take 1 tablet by mouth daily. 90 tablet 3  . vitamin C (ASCORBIC ACID) 250 MG tablet Take 250 mg by mouth daily.    Marland Kitchen ibuprofen (ADVIL) 800 MG tablet Take 1 tablet (800 mg total) by mouth every 8 (eight) hours as needed for moderate pain. (Patient not taking: Reported on 09/13/2019) 21 tablet 0   No facility-administered medications prior to visit.    ROS See HPI  Objective:  BP 132/88   Pulse 72   Temp (!) 96.8 F (36 C) (Tympanic)   Ht 5\' 3"  (1.6 m)   Wt 158 lb 12.8 oz (72 kg)   SpO2 97%   BMI 28.13 kg/m   BP Readings from Last 3 Encounters:  09/13/19 132/88  09/10/19 (!) 175/68  08/31/19 (!) 148/80   Wt Readings from Last 3 Encounters:  09/13/19 158 lb 12.8 oz (72 kg)    09/10/19 160 lb (72.6 kg)  08/31/19 158 lb 12.8 oz (72 kg)   Physical Exam HENT:     Right Ear: Tympanic membrane, ear canal and external ear normal.     Left Ear: Tympanic membrane, ear canal and external ear normal.  Eyes:     Extraocular Movements: Extraocular movements intact.     Conjunctiva/sclera: Conjunctivae normal.     Pupils: Pupils are equal, round, and reactive to light.  Cardiovascular:     Rate and Rhythm: Normal rate and regular rhythm.     Pulses: Normal pulses.     Heart sounds: Normal heart sounds.  Pulmonary:     Effort: Pulmonary effort is normal.  Musculoskeletal:     Cervical back: Normal range of motion and neck supple.  Neurological:     Mental Status: She is oriented to person, place, and time.     Cranial Nerves: No cranial nerve deficit or facial asymmetry.     Motor: Motor function is intact.     Coordination: Coordination is intact.     Gait: Gait abnormal.     Comments: Ataxic gait.    Lab Results  Component Value Date   WBC 7.2 09/10/2019   HGB 11.9 (L) 09/10/2019   HCT 37.6 09/10/2019   PLT 188 09/10/2019   GLUCOSE 100 (H) 09/10/2019   CHOL 193 10/26/2007   TRIG 126 10/26/2007   HDL 55  10/26/2007   LDLDIRECT 112 (H) 10/26/2007   LDLCALC 113 (H) 10/26/2007   ALT 19 07/19/2016   AST 22 07/19/2016   NA 141 09/10/2019   K 3.5 09/10/2019   CL 102 09/10/2019   CREATININE 0.77 09/10/2019   BUN 20 09/10/2019   CO2 29 09/10/2019   TSH 1.636 02/24/2007   INR 0.99 07/19/2016   CT Head Wo Contrast  Result Date: 09/10/2019 CLINICAL DATA:  Seizure versus syncope. EXAM: CT HEAD WITHOUT CONTRAST TECHNIQUE: Contiguous axial images were obtained from the base of the skull through the vertex without intravenous contrast. COMPARISON:  None. FINDINGS: Brain: There is mild cerebral atrophy with widening of the extra-axial spaces and ventricular dilatation. There are areas of decreased attenuation within the white matter tracts of the supratentorial  brain, consistent with microvascular disease changes. Vascular: No hyperdense vessel or unexpected calcification. Skull: Normal. Negative for fracture or focal lesion. Sinuses/Orbits: No acute finding. Other: None. IMPRESSION: No acute intracranial pathology. Electronically Signed   By: Aram Candela M.D.   On: 09/10/2019 16:49    Assessment & Plan:  This visit occurred during the SARS-CoV-2 public health emergency.  Safety protocols were in place, including screening questions prior to the visit, additional usage of staff PPE, and extensive cleaning of exam room while observing appropriate contact time as indicated for disinfecting solutions.   Lashaya was seen today for dizziness.  Diagnoses and all orders for this visit:  Vertigo  Other orders -     meclizine (ANTIVERT) 12.5 MG tablet; Take 1 tablet (12.5 mg total) by mouth 2 (two) times daily as needed for dizziness (do not use for more than 3days).   I am having Maille Gillentine start on meclizine. I am also having her maintain her Cholecalciferol, vitamin C, ibuprofen, and losartan-hydrochlorothiazide.  Meds ordered this encounter  Medications  . meclizine (ANTIVERT) 12.5 MG tablet    Sig: Take 1 tablet (12.5 mg total) by mouth 2 (two) times daily as needed for dizziness (do not use for more than 3days).    Dispense:  6 tablet    Refill:  0    Order Specific Question:   Supervising Provider    Answer:   Overton Mam [[id number]]   Problem List Items Addressed This Visit    None    Visit Diagnoses    Vertigo    -  Primary      Follow-up: Return if symptoms worsen or fail to improve.  Alysia Penna, NP

## 2019-09-14 NOTE — Telephone Encounter (Signed)
Pt had labs done at Hospital on 09/10/19 and had an appt with [facility] yesterday and has a hospital follow up Wednesday 09/19/19

## 2019-09-18 ENCOUNTER — Other Ambulatory Visit: Payer: Self-pay

## 2019-09-18 NOTE — Telephone Encounter (Signed)
Pt is scheduled for a visit w/Dr. C tomorrow.

## 2019-09-18 NOTE — Telephone Encounter (Signed)
FYI

## 2019-09-19 ENCOUNTER — Encounter: Payer: Self-pay | Admitting: Family Medicine

## 2019-09-19 ENCOUNTER — Ambulatory Visit (INDEPENDENT_AMBULATORY_CARE_PROVIDER_SITE_OTHER): Payer: 59 | Admitting: Family Medicine

## 2019-09-19 VITALS — BP 130/78 | HR 71 | Temp 97.3°F | Ht 63.0 in | Wt 158.8 lb

## 2019-09-19 DIAGNOSIS — I10 Essential (primary) hypertension: Secondary | ICD-10-CM

## 2019-09-19 DIAGNOSIS — R55 Syncope and collapse: Secondary | ICD-10-CM | POA: Diagnosis not present

## 2019-09-19 DIAGNOSIS — H538 Other visual disturbances: Secondary | ICD-10-CM | POA: Diagnosis not present

## 2019-09-19 NOTE — Progress Notes (Signed)
Karen [name] is a 79 y.o. female  Chief Complaint  Patient presents with  . Hospitalization Follow-up    Pt c/o going to the ER last Monday.  Pt had gotten dizzy and fell out during shopping in [place], when EMS was called.  Pt was told that she was dehydrated and was given 3 day supply of  Meclizine, which has helped with the dizziness.  Pt said that she has been feeling fatigue and also has blurred vision.  Pt was taken off of Amlodipine due to her feet swelling.  Pt is requesting labs for swelling, BUN, EGFR, BNP and UA, via her [place] person that comes to see her once a year.  Pt not fasting    HPI: Karen [name] is a 79 y.o. female who is accompanied by her son. She was seen in ER on 09/10/19 after near-syncopal episode in [place]. Pt states she felt warm, like things were closing in, and then very weak. She does admit it had been 6+ hours since she had last eaten. She also admits to not drinking enough water/fluids. EKG, CT head, labs reassuring in ER, pt d/c'd home with 3 tabs of meclizine.   She complains of blurry vision since 09/10/19. No lightheadedness, no dizziness. No fatigue. No CP, SOB. No n/v.  Pt has increased her water/fluid intake.  She has eye appt on 10/12/19.  Sleep is good/normal. Appetite is normal.  She does note a h/o vertigo in ? 2018   Past Medical History:  Diagnosis Date  . Hypertension     History reviewed. No pertinent surgical history.  Social History   Socioeconomic History  . Marital status: Widowed    Spouse name: Not on file  . Number of children: Not on file  . Years of education: Not on file  . Highest education level: Not on file  Occupational History  . Not on file  Tobacco Use  . Smoking status: Never Smoker  . Smokeless tobacco: Never Used  Substance and Sexual Activity  . Alcohol use: No  . Drug use: No  . Sexual activity: Not Currently    Birth control/protection: None  Other Topics Concern  . Not on file  Social  History Narrative  . Not on file   Social Determinants of Health   Financial Resource Strain: Low Risk   . Difficulty of Paying Living Expenses: Not hard at all  Food Insecurity: No Food Insecurity  . Worried About Charity fundraiser in the Last Year: Never true  . Ran Out of Food in the Last Year: Never true  Transportation Needs: No Transportation Needs  . Lack of Transportation (Medical): No  . Lack of Transportation (Non-Medical): No  Physical Activity:   . Days of Exercise per Week:   . Minutes of Exercise per Session:   Stress:   . Feeling of Stress :   Social Connections:   . Frequency of Communication with Friends and Family:   . Frequency of Social Gatherings with Friends and Family:   . Attends Religious Services:   . Active Member of Clubs or Organizations:   . Attends Archivist Meetings:   Marland Kitchen Marital Status:   Intimate Partner Violence:   . Fear of Current or Ex-Partner:   . Emotionally Abused:   Marland Kitchen Physically Abused:   . Sexually Abused:     Family History  Problem Relation Age of Onset  . Hypertension Mother   . Hyperlipidemia Mother   . Hypertension  Father       There is no immunization history on file for this patient.  Outpatient Encounter Medications as of 09/19/2019  Medication Sig  . Cholecalciferol 50 MCG (2000 UT) TABS Take 2,000 Units by mouth daily.   Marland Kitchen ibuprofen (ADVIL) 800 MG tablet Take 1 tablet (800 mg total) by mouth every 8 (eight) hours as needed for moderate pain.  Marland Kitchen losartan-hydrochlorothiazide (HYZAAR) 100-25 MG tablet Take 1 tablet by mouth daily.  . vitamin C (ASCORBIC ACID) 250 MG tablet Take 250 mg by mouth daily.  . meclizine (ANTIVERT) 12.5 MG tablet Take 1 tablet (12.5 mg total) by mouth 2 (two) times daily as needed for dizziness (do not use for more than 3days). (Patient not taking: Reported on 09/19/2019)   No facility-administered encounter medications on file as of 09/19/2019.     ROS: Pertinent positives and  negatives noted in HPI. Remainder of ROS non-contributory    No Known Allergies  BP 130/78 (BP Location: Left Arm, Patient Position: Sitting, Cuff Size: Normal)   Pulse 71   Temp (!) 97.3 F (36.3 C) (Temporal)   Ht _0  (1.6 m)   Wt 158 lb 12.8 oz (72 kg)   SpO2 96%   BMI 28.13 kg/m  BP Readings from Last 3 Encounters:  09/19/19 130/78  09/13/19 132/88  09/10/19 (!) 175/68   Pulse Readings from Last 3 Encounters:  09/19/19 71  09/13/19 72  09/10/19 (!) 54    Physical Exam  Constitutional: She is oriented to person, place, and time. She appears well-developed and well-nourished. No distress.  Eyes: Conjunctivae and EOM are normal.  Neck: No JVD present. Carotid bruit is not present.  Cardiovascular: Normal rate and regular rhythm.  Pulmonary/Chest: No respiratory distress.  Musculoskeletal:        General: No edema.  Neurological: She is alert and oriented to person, place, and time.  Psychiatric: She has a normal mood and affect. Her behavior is normal.     A/P:  1. Essential hypertension - controlled, at goal - cont on current med  2. Near syncope - likely d/t dehydration and possibly hypoglycemia. Pt has increased fluid intake significantly and not having having any further symptoms - does not sound like true vertigo, although pt does have a h/o vertigo  3. Blurry vision, bilateral - pt has appt scheduled for 10/12/19 for vision exam but pt and son unsure if she is seeing optometry or ophthalmology    This visit occurred during the SARS-CoV-2 public health emergency.  Safety protocols were in place, including screening questions prior to the visit, additional usage of staff PPE, and extensive cleaning of exam room while observing appropriate contact time as indicated for disinfecting solutions.

## 2019-09-20 ENCOUNTER — Ambulatory Visit: Payer: 59 | Admitting: Family Medicine

## 2020-01-10 ENCOUNTER — Ambulatory Visit (INDEPENDENT_AMBULATORY_CARE_PROVIDER_SITE_OTHER): Payer: 59 | Admitting: Family Medicine

## 2020-01-10 ENCOUNTER — Other Ambulatory Visit: Payer: Self-pay

## 2020-01-10 ENCOUNTER — Encounter: Payer: Self-pay | Admitting: Family Medicine

## 2020-01-10 VITALS — BP 134/84 | HR 74 | Temp 97.5°F | Ht 63.0 in | Wt 159.8 lb

## 2020-01-10 DIAGNOSIS — I10 Essential (primary) hypertension: Secondary | ICD-10-CM

## 2020-01-10 DIAGNOSIS — Z2821 Immunization not carried out because of patient refusal: Secondary | ICD-10-CM

## 2020-01-10 NOTE — Progress Notes (Signed)
Chief Complaint  Patient presents with  . Follow-up    f/u HTN/meds, declines Flu shot    HPI: Karen [name] is a 79 y.o. female here for HTN follow-up. Pt is taking hyzaar 100-25 1 tab po daily. She does not check BP at home.  No headaches, dizziness, vision changes, CP, SOB, LE edema. No side effects from med. Diet: very little salt, cuts out soda Exercise: nothing regular/consistent  BP Readings from Last 3 Encounters:  01/10/20 134/84  09/19/19 130/78  09/13/19 132/88   Lab Results  Component Value Date   CREATININE 0.77 09/10/2019   BUN 20 09/10/2019   NA 141 09/10/2019   K 3.5 09/10/2019   CL 102 09/10/2019   CO2 29 09/10/2019   Past Medical History:  Diagnosis Date  . Hypertension     History reviewed. No pertinent surgical history.  Social History   Socioeconomic History  . Marital status: Widowed    Spouse name: Not on file  . Number of children: Not on file  . Years of education: Not on file  . Highest education level: Not on file  Occupational History  . Not on file  Tobacco Use  . Smoking status: Never Smoker  . Smokeless tobacco: Never Used  Vaping Use  . Vaping Use: Never used  Substance and Sexual Activity  . Alcohol use: No  . Drug use: No  . Sexual activity: Not Currently    Birth control/protection: None  Other Topics Concern  . Not on file  Social History Narrative  . Not on file   Social Determinants of Health   Financial Resource Strain: Low Risk   . Difficulty of Paying Living Expenses: Not hard at all  Food Insecurity: No Food Insecurity  . Worried About Programme researcher, broadcasting/film/video in the Last Year: Never true  . Ran Out of Food in the Last Year: Never true  Transportation Needs: No Transportation Needs  . Lack of Transportation (Medical): No  . Lack of Transportation (Non-Medical): No  Physical Activity:   . Days of Exercise per Week: Not on file  . Minutes of Exercise per Session: Not on file  Stress:   . Feeling of  Stress : Not on file  Social Connections:   . Frequency of Communication with Friends and Family: Not on file  . Frequency of Social Gatherings with Friends and Family: Not on file  . Attends Religious Services: Not on file  . Active Member of Clubs or Organizations: Not on file  . Attends Banker Meetings: Not on file  . Marital Status: Not on file  Intimate Partner Violence:   . Fear of Current or Ex-Partner: Not on file  . Emotionally Abused: Not on file  . Physically Abused: Not on file  . Sexually Abused: Not on file    Family History  Problem Relation Age of Onset  . Hypertension Mother   . Hyperlipidemia Mother   . Hypertension Father      Immunization History  Administered Date(s) Administered  . PFIZER SARS-COV-2 Vaccination 07/27/2019, 08/24/2019    Outpatient Encounter Medications as of 01/10/2020  Medication Sig  . Cholecalciferol 50 MCG (2000 UT) TABS Take 2,000 Units by mouth daily.   Marland Kitchen ibuprofen (ADVIL) 800 MG tablet Take 1 tablet (800 mg total) by mouth every 8 (eight) hours as needed for moderate pain.  Marland Kitchen losartan-hydrochlorothiazide (HYZAAR) 100-25 MG tablet Take 1 tablet by mouth daily.  . meclizine (ANTIVERT) 12.5 MG tablet  Take 1 tablet (12.5 mg total) by mouth 2 (two) times daily as needed for dizziness (do not use for more than 3days).  . vitamin C (ASCORBIC ACID) 250 MG tablet Take 250 mg by mouth daily.   No facility-administered encounter medications on file as of 01/10/2020.     ROS: Pertinent positives and negatives noted in HPI. Remainder of ROS non-contributory   No Known Allergies  BP 134/84   Pulse 74   Temp (!) 97.5 F (36.4 C) (Temporal)   Ht 5\' 3"  (1.6 m)   Wt 159 lb 12.8 oz (72.5 kg)   BMI 28.31 kg/m    BP Readings from Last 3 Encounters:  01/10/20 134/84  09/19/19 130/78  09/13/19 132/88   Pulse Readings from Last 3 Encounters:  01/10/20 74  09/19/19 71  09/13/19 72     Physical Exam Vitals reviewed.    Constitutional:      General: She is not in acute distress.    Appearance: Normal appearance. She is not ill-appearing.  Cardiovascular:     Rate and Rhythm: Normal rate and regular rhythm.     Pulses: Normal pulses.  Pulmonary:     Effort: Pulmonary effort is normal. No respiratory distress.     Breath sounds: Normal breath sounds.  Musculoskeletal:     Right lower leg: No edema.     Left lower leg: No edema.  Neurological:     Mental Status: She is alert and oriented to person, place, and time.  Psychiatric:        Mood and Affect: Mood normal.        Behavior: Behavior normal.      A/P:  1. Essential hypertension - controlled, at goal - cont hyzaar 100-25mg  daily - cont with low sodium diet - recommend walking regularly - f/u in 08/2019 - will check labs, refill med - or sooner PRN  2. Influenza vaccination declined by patient  This visit occurred during the SARS-CoV-2 public health emergency.  Safety protocols were in place, including screening questions prior to the visit, additional usage of staff PPE, and extensive cleaning of exam room while observing appropriate contact time as indicated for disinfecting solutions.

## 2020-04-07 ENCOUNTER — Other Ambulatory Visit: Payer: Self-pay

## 2020-04-08 ENCOUNTER — Encounter: Payer: Self-pay | Admitting: Family

## 2020-04-08 ENCOUNTER — Ambulatory Visit (INDEPENDENT_AMBULATORY_CARE_PROVIDER_SITE_OTHER): Payer: 59 | Admitting: Family

## 2020-04-08 ENCOUNTER — Ambulatory Visit (INDEPENDENT_AMBULATORY_CARE_PROVIDER_SITE_OTHER): Payer: 59

## 2020-04-08 VITALS — BP 156/80 | HR 74 | Temp 97.0°F | Ht 63.0 in | Wt 158.8 lb

## 2020-04-08 DIAGNOSIS — M7731 Calcaneal spur, right foot: Secondary | ICD-10-CM | POA: Diagnosis not present

## 2020-04-08 DIAGNOSIS — M79671 Pain in right foot: Secondary | ICD-10-CM

## 2020-04-08 MED ORDER — PREDNISONE 10 MG (21) PO TBPK: ORAL_TABLET | ORAL | 0 refills | Status: DC

## 2020-04-08 NOTE — Patient Instructions (Signed)
 Heel Spur  A heel spur is a bony growth that forms on the bottom of the heel bone (calcaneus). Heel spurs are common. They often cause inflammation in the band of tissue that connects the toes to the heel bone (plantar fascia). This may cause pain on the bottom of the foot, near the heel. Many people with plantar fasciitis also have heel spurs. However, spurs are not the cause of plantar fasciitis pain. What are the causes? The exact cause of heel spurs is not known. They may be caused by:  Pressure on the heel bone.  Bands of tissue (tendons) pulling on the heel bone. What increases the risk? You are more likely to develop this condition if you:  Are older than 40.  Are overweight.  Have wear-and-tear arthritis (osteoarthritis).  Have plantar fascia inflammation.  Participate in sports or activities that include a lot of running or jumping.  Wear poorly fitted shoes. What are the signs or symptoms? Some people have no symptoms. If you do have symptoms, they may include:  Pain in the bottom of your heel.  Pain that is worse when you first get out of bed.  Pain that gets worse after walking or standing. How is this diagnosed? This condition may be diagnosed based on:  Your symptoms and medical history.  A physical exam.  A foot X-ray. How is this treated? Treatment for this condition depends on how much pain you have. Treatment options may include:  Doing stretching exercises.  Losing weight, if necessary.  Wearing specific shoes or inserts inside of shoes (orthotics) for comfort and support.  Wearing splints on your feet while you sleep. Splints keep your feet in a position (usually 90 degrees) that should prevent and relieve the pain you feel when you first get out of bed. They also make stretching easier in the morning.  Taking over-the-counter medicine to relieve pain, such as NSAIDs.  Using high-intensity sound waves to break up the heel spur  (extracorporeal shock wave therapy).  Getting steroid injections in your heel to reduce inflammation.  Having surgery, if your heel spur causes long-term (chronic) pain. Follow these instructions at home:  Activity  Avoid activities that cause pain until you recover, or for as long as directed by your health care provider.  Do stretching exercises as directed. Stretch before exercising or being physically active. Managing pain, stiffness, and swelling  If directed, put ice on your foot: ? Put ice in a plastic bag. ? Place a towel between your skin and the bag. ? Leave the ice on for 20 minutes, 2-3 times a day.  Move your toes often to avoid stiffness and to lessen swelling.  When possible, raise (elevate) your foot above the level of your heart while you are sitting or lying down. General instructions  Take over-the-counter and prescription medicines only as told by your health care provider.  Wear supportive shoes that fit well. Wear splints, inserts, or orthotics as told by your health care provider.  If recommended, work with your health care provider to lose weight. This can relieve pressure on your foot.  Do not use any products that contain nicotine or tobacco, such as cigarettes and e-cigarettes. These can affect bone growth and healing. If you need help quitting, ask your health care provider.  Keep all follow-up visits as told by your health care provider. This is important. Contact a health care provider if:  Your pain does not go away with treatment.  Your pain   gets worse. Summary  A heel spur is a bony growth that forms on the bottom of the heel bone (calcaneus).  Heel spurs often cause inflammation in the band of tissue that connects the toes to the heel bone (plantar fascia). This may cause pain on the bottom of the foot, near the heel.  Doing stretching exercises, losing weight, wearing specific shoes or shoe inserts, wearing splints while you sleep, and  taking pain medicine may ease the pain and stiffness.  Other treatment options may include high-intensity sound waves to break up the heel spur, steroid injections, or surgery. This information is not intended to replace advice given to you by your health care provider. Make sure you discuss any questions you have with your health care provider. Document Revised: 03/23/2017 Document Reviewed: 03/23/2017 Elsevier Patient Education  2020 [employer]  

## 2020-04-08 NOTE — Addendum Note (Signed)
Addended by: Varney Biles on: 04/08/2020 01:13 PM   Modules accepted: Orders

## 2020-04-08 NOTE — Progress Notes (Signed)
Acute Office Visit  Subjective:    Patient ID: Karen [name], female    DOB: [birth year], 79 y.o.   MRN: [id number]  Chief Complaint  Patient presents with  . Foot Swelling    Right foot pain at heel, swollen symptoms x 2 days.     HPI Patient is in today with c/o right heel swelling and pain  X 2 days and worsening. The foot is very painful to bear weight. No injury. Has taken ibuprofen that helps some  Past Medical History:  Diagnosis Date  . Hypertension     History reviewed. No pertinent surgical history.  Family History  Problem Relation Age of Onset  . Hypertension Mother   . Hyperlipidemia Mother   . Hypertension Father     Social History   Socioeconomic History  . Marital status: Widowed    Spouse name: Not on file  . Number of children: Not on file  . Years of education: Not on file  . Highest education level: Not on file  Occupational History  . Not on file  Tobacco Use  . Smoking status: Never Smoker  . Smokeless tobacco: Never Used  Vaping Use  . Vaping Use: Never used  Substance and Sexual Activity  . Alcohol use: No  . Drug use: No  . Sexual activity: Not Currently    Birth control/protection: None  Other Topics Concern  . Not on file  Social History Narrative  . Not on file   Social Determinants of Health   Financial Resource Strain: Low Risk   . Difficulty of Paying Living Expenses: Not hard at all  Food Insecurity: No Food Insecurity  . Worried About Programme researcher, broadcasting/film/video in the Last Year: Never true  . Ran Out of Food in the Last Year: Never true  Transportation Needs: No Transportation Needs  . Lack of Transportation (Medical): No  . Lack of Transportation (Non-Medical): No  Physical Activity: Not on file  Stress: Not on file  Social Connections: Not on file  Intimate Partner Violence: Not on file    Outpatient Medications Prior to Visit  Medication Sig Dispense Refill  . ibuprofen (ADVIL) 800 MG tablet Take 1 tablet (800 mg  total) by mouth every 8 (eight) hours as needed for moderate pain. 21 tablet 0  . losartan-hydrochlorothiazide (HYZAAR) 100-25 MG tablet Take 1 tablet by mouth daily. 90 tablet 3  . vitamin C (ASCORBIC ACID) 250 MG tablet Take 250 mg by mouth daily.    . Cholecalciferol 50 MCG (2000 UT) TABS Take 2,000 Units by mouth daily.  (Patient not taking: Reported on 04/08/2020)    . meclizine (ANTIVERT) 12.5 MG tablet Take 1 tablet (12.5 mg total) by mouth 2 (two) times daily as needed for dizziness (do not use for more than 3days). (Patient not taking: Reported on 04/08/2020) 6 tablet 0   No facility-administered medications prior to visit.    No Known Allergies  Review of Systems  Respiratory: Negative.   Cardiovascular: Negative.   Musculoskeletal: Positive for gait problem.       Right heel pain  Skin: Negative.   Neurological: Negative for weakness and numbness.  Psychiatric/Behavioral: Negative.   All other systems reviewed and are negative.      Objective:    Physical Exam Vitals reviewed.  Constitutional:      Appearance: Normal appearance.  Cardiovascular:     Rate and Rhythm: Normal rate and regular rhythm.  Pulmonary:     Effort: Pulmonary  effort is normal.     Breath sounds: Normal breath sounds.  Musculoskeletal:     Cervical back: Normal range of motion and neck supple.     Comments: Tenderness to palpation of the right heel, moderate swelling, pain with ambulation. Xray shoes calcaneous heel spur.   Skin:    General: Skin is warm and dry.  Neurological:     General: No focal deficit present.     Mental Status: She is alert and oriented to person, place, and time.  Psychiatric:        Mood and Affect: Mood normal.     BP (!) 156/80   Pulse 74   Temp (!) 97 F (36.1 C) (Temporal)   Ht 5\' 3"  (1.6 m)   Wt 158 lb 12.8 oz (72 kg)   SpO2 98%   BMI 28.13 kg/m  Wt Readings from Last 3 Encounters:  04/08/20 158 lb 12.8 oz (72 kg)  01/10/20 159 lb 12.8 oz (72.5  kg)  09/19/19 158 lb 12.8 oz (72 kg)    Health Maintenance Due  Topic Date Due  . PNA vac Low Risk Adult (2 of 2 - PPSV23) 12/14/2016  . TETANUS/TDAP  05/23/2019  . COVID-19 Vaccine (3 - Pfizer risk 4-dose series) 09/21/2019    There are no preventive care reminders to display for this patient.   Lab Results  Component Value Date   TSH 1.636 02/24/2007   Lab Results  Component Value Date   WBC 7.2 09/10/2019   HGB 11.9 (L) 09/10/2019   HCT 37.6 09/10/2019   MCV 87.2 09/10/2019   PLT 188 09/10/2019   Lab Results  Component Value Date   NA 141 09/10/2019   K 3.5 09/10/2019   CO2 29 09/10/2019   GLUCOSE 100 (H) 09/10/2019   BUN 20 09/10/2019   CREATININE 0.77 09/10/2019   BILITOT 0.3 07/19/2016   ALKPHOS 70 07/19/2016   AST 22 07/19/2016   ALT 19 07/19/2016   PROT 7.0 07/19/2016   ALBUMIN 3.3 (L) 07/19/2016   CALCIUM 9.3 09/10/2019   ANIONGAP 10 09/10/2019   Lab Results  Component Value Date   CHOL 193 10/26/2007   Lab Results  Component Value Date   HDL 55 10/26/2007   Lab Results  Component Value Date   LDLCALC 113 (H) 10/26/2007   Lab Results  Component Value Date   TRIG 126 10/26/2007   Lab Results  Component Value Date   CHOLHDL 3.5 Ratio 10/26/2007   No results found for: HGBA1C     Assessment & Plan:   Problem List Items Addressed This Visit   None   Visit Diagnoses    Pain of right heel    -  Primary   Relevant Orders   DG Foot Complete Right   Calcaneal spur of right foot           Meds ordered this encounter  Medications  . predniSONE (STERAPRED UNI-PAK 21 TAB) 10 MG (21) TBPK tablet    Sig: As directed    Dispense:  21 tablet    Refill:  0   Advised ice, heel support in the shoes. Will refer to ortho is symptoms persist.   12/27/2007, FNP

## 2020-07-31 ENCOUNTER — Telehealth: Payer: Self-pay | Admitting: Family Medicine

## 2020-07-31 NOTE — Telephone Encounter (Signed)
Left message for patient to schedule Annual Wellness Visit.  Please schedule with Nurse Health Advisor Martha Stanley, RN at [facility]  °

## 2020-09-09 NOTE — Patient Instructions (Addendum)
Health Maintenance Due  Topic Date Due  . PNA vac Low Risk Adult (2 of 2 - PPSV23) 12/14/2016  . TETANUS/TDAP  05/23/2019  . COVID-19 Vaccine (3 - Pfizer risk 4-dose series) 09/21/2019    Depression screen [facility] 2/9 04/08/2020 08/22/2019 12/06/2015  Decreased Interest 0 0 0  Down, Depressed, Hopeless 0 0 0  PHQ - 2 Score 0 0 0   Consider shingles vaccine (Shingrix)

## 2020-09-10 ENCOUNTER — Other Ambulatory Visit: Payer: Self-pay

## 2020-09-10 ENCOUNTER — Ambulatory Visit (INDEPENDENT_AMBULATORY_CARE_PROVIDER_SITE_OTHER): Payer: 59 | Admitting: Family Medicine

## 2020-09-10 ENCOUNTER — Encounter: Payer: Self-pay | Admitting: Family Medicine

## 2020-09-10 VITALS — BP 118/70 | HR 71 | Temp 97.6°F | Resp 16 | Wt 162.2 lb

## 2020-09-10 DIAGNOSIS — I10 Essential (primary) hypertension: Secondary | ICD-10-CM | POA: Diagnosis not present

## 2020-09-10 DIAGNOSIS — Z13220 Encounter for screening for lipoid disorders: Secondary | ICD-10-CM

## 2020-09-10 DIAGNOSIS — Z87440 Personal history of urinary (tract) infections: Secondary | ICD-10-CM

## 2020-09-10 DIAGNOSIS — Z1321 Encounter for screening for nutritional disorder: Secondary | ICD-10-CM | POA: Diagnosis not present

## 2020-09-10 MED ORDER — LOSARTAN POTASSIUM-HCTZ 100-25 MG PO TABS: 1.0000 | ORAL_TABLET | Freq: Every day | ORAL | 3 refills | Status: DC

## 2020-09-10 NOTE — Progress Notes (Signed)
Karen [name] is a 80 y.o. female  Chief Complaint  Patient presents with  . Follow-up    HPI: Karen [name] is a 80 y.o. female seen today for annual exam and f/u on HTN. Pt is taking hyzaar 100-25 1 tab po daily. Pt drinks a lot of water. Uses sea salt and does not consume high sodium foods often.   Diet/Exercise: diet is "ok", not too much "junk" or fast food; walks but not regularly/consistently - "I'm kind of lazy" Dental: has appt scheduled in 10/2020 Vision: having vision issues mainly with Lt eye, on/off x 2 mo; pt needs to call and schedule eye doctor appt, last exam within the past 1 year  Med refills needed today? Yes - see orders  Immunization History  Administered Date(s) Administered  . PFIZER(Purple Top)SARS-COV-2 Vaccination 07/27/2019, 08/24/2019    Past Medical History:  Diagnosis Date  . Hypertension     History reviewed. No pertinent surgical history.  Social History   Socioeconomic History  . Marital status: Widowed    Spouse name: Not on file  . Number of children: Not on file  . Years of education: Not on file  . Highest education level: Not on file  Occupational History  . Not on file  Tobacco Use  . Smoking status: Never Smoker  . Smokeless tobacco: Never Used  Vaping Use  . Vaping Use: Never used  Substance and Sexual Activity  . Alcohol use: No  . Drug use: No  . Sexual activity: Not Currently    Birth control/protection: None  Other Topics Concern  . Not on file  Social History Narrative  . Not on file   Social Determinants of Health   Financial Resource Strain: Not on file  Food Insecurity: Not on file  Transportation Needs: Not on file  Physical Activity: Not on file  Stress: Not on file  Social Connections: Not on file  Intimate Partner Violence: Not on file    Family History  Problem Relation Age of Onset  . Hypertension Mother   . Hyperlipidemia Mother   . Hypertension Father      Immunization History   Administered Date(s) Administered  . PFIZER(Purple Top)SARS-COV-2 Vaccination 07/27/2019, 08/24/2019    Outpatient Encounter Medications as of 09/10/2020  Medication Sig  . Cholecalciferol 50 MCG (2000 UT) TABS Take 2,000 Units by mouth daily.  Marland Kitchen ibuprofen (ADVIL) 800 MG tablet Take 1 tablet (800 mg total) by mouth every 8 (eight) hours as needed for moderate pain.  . vitamin C (ASCORBIC ACID) 250 MG tablet Take 250 mg by mouth daily.  . [DISCONTINUED] losartan-hydrochlorothiazide (HYZAAR) 100-25 MG tablet Take 1 tablet by mouth daily.  Marland Kitchen losartan-hydrochlorothiazide (HYZAAR) 100-25 MG tablet Take 1 tablet by mouth daily.  . [DISCONTINUED] meclizine (ANTIVERT) 12.5 MG tablet Take 1 tablet (12.5 mg total) by mouth 2 (two) times daily as needed for dizziness (do not use for more than 3days). (Patient not taking: No sig reported)  . [DISCONTINUED] predniSONE (STERAPRED UNI-PAK 21 TAB) 10 MG (21) TBPK tablet As directed (Patient not taking: Reported on 09/10/2020)   No facility-administered encounter medications on file as of 09/10/2020.     ROS: Gen: no fever, chills  Skin: no rash, itching ENT: no ear pain, ear drainage, nasal congestion, rhinorrhea, sinus pressure, sore throat Eyes: no blurry vision, double vision Resp: no cough, wheeze,SOB Breast: no breast tenderness, no nipple discharge, no breast masses CV: no CP, palpitations, LE edema,  GI: no heartburn, n/v/d/c, abd  pain GU: no dysuria, urgency, frequency, hematuria MSK: no joint pain, myalgias, back pain Neuro: no dizziness, headache, weakness, vertigo Psych: no depression, anxiety, insomnia   No Known Allergies  BP 118/70   Pulse 71   Temp 97.6 F (36.4 C)   Resp 16   Wt 162 lb 3.2 oz (73.6 kg)   SpO2 98%   BMI 28.73 kg/m   Wt Readings from Last 3 Encounters:  09/10/20 162 lb 3.2 oz (73.6 kg)  04/08/20 158 lb 12.8 oz (72 kg)  01/10/20 159 lb 12.8 oz (72.5 kg)   Temp Readings from Last 3 Encounters:  09/10/20  97.6 F (36.4 C)  04/08/20 (!) 97 F (36.1 C) (Temporal)  01/10/20 (!) 97.5 F (36.4 C) (Temporal)   BP Readings from Last 3 Encounters:  09/10/20 118/70  04/08/20 (!) 156/80  01/10/20 134/84   Pulse Readings from Last 3 Encounters:  09/10/20 71  04/08/20 74  01/10/20 74     Physical Exam Constitutional:      General: She is not in acute distress.    Appearance: She is well-developed.  HENT:     Head: Normocephalic and atraumatic.     Right Ear: Tympanic membrane and ear canal normal.     Left Ear: Tympanic membrane and ear canal normal.     Nose: Nose normal.     Mouth/Throat:     Mouth: Mucous membranes are moist.     Pharynx: Oropharynx is clear.  Eyes:     Conjunctiva/sclera: Conjunctivae normal.  Neck:     Thyroid: No thyromegaly.  Cardiovascular:     Rate and Rhythm: Normal rate and regular rhythm.     Pulses: Normal pulses.  Pulmonary:     Effort: Pulmonary effort is normal. No respiratory distress.     Breath sounds: Normal breath sounds. No wheezing or rhonchi.  Abdominal:     General: Bowel sounds are normal. There is no distension.     Palpations: Abdomen is soft. There is no mass.     Tenderness: There is no abdominal tenderness.  Musculoskeletal:     Cervical back: Neck supple.     Right lower leg: No edema.     Left lower leg: No edema.  Lymphadenopathy:     Cervical: No cervical adenopathy.  Skin:    General: Skin is warm and dry.  Neurological:     Mental Status: She is alert and oriented to person, place, and time.     Motor: No abnormal muscle tone.     Coordination: Coordination normal.  Psychiatric:        Behavior: Behavior normal.      A/P:  1. Essential hypertension - controlled, at goal - CBC - Comprehensive metabolic panel Refill: - losartan-hydrochlorothiazide (HYZAAR) 100-25 MG tablet; Take 1 tablet by mouth daily.  Dispense: 90 tablet; Refill: 3  2. Screening for lipid disorders - Lipid panel  3. Encounter for  vitamin deficiency screening - pt is Vit D 2000IU daily - VITAMIN D 25 Hydroxy (Vit-D Deficiency, Fractures)  4. History of UTI - Urinalysis, Routine w reflex microscopic    This visit occurred during the SARS-CoV-2 public health emergency.  Safety protocols were in place, including screening questions prior to the visit, additional usage of staff PPE, and extensive cleaning of exam room while observing appropriate contact time as indicated for disinfecting solutions.

## 2020-09-11 LAB — URINALYSIS, ROUTINE W REFLEX MICROSCOPIC
Bilirubin Urine: NEGATIVE
Ketones, ur: NEGATIVE
Leukocytes,Ua: NEGATIVE
Nitrite: NEGATIVE
Specific Gravity, Urine: 1.01 (ref 1.000–1.030)
Total Protein, Urine: NEGATIVE
Urine Glucose: NEGATIVE
Urobilinogen, UA: 0.2 (ref 0.0–1.0)
pH: 6.5 (ref 5.0–8.0)

## 2020-09-11 LAB — LIPID PANEL
Cholesterol: 214 mg/dL — ABNORMAL HIGH (ref 0–200)
HDL: 61.1 mg/dL (ref >39.00)
LDL Cholesterol: 119 mg/dL — ABNORMAL HIGH (ref 0–99)
NonHDL: 153.03
Total CHOL/HDL Ratio: 4
Triglycerides: 170 mg/dL — ABNORMAL HIGH (ref 0.0–149.0)
VLDL: 34 mg/dL (ref 0.0–40.0)

## 2020-09-11 LAB — COMPREHENSIVE METABOLIC PANEL
ALT: 13 U/L (ref 0–35)
AST: 17 U/L (ref 0–37)
Albumin: 4.1 g/dL (ref 3.5–5.2)
Alkaline Phosphatase: 73 U/L (ref 39–117)
BUN: 18 mg/dL (ref 6–23)
CO2: 30 mEq/L (ref 19–32)
Calcium: 9.6 mg/dL (ref 8.4–10.5)
Chloride: 102 mEq/L (ref 96–112)
Creatinine, Ser: 0.76 mg/dL (ref 0.40–1.20)
GFR: 73.88 mL/min (ref >60.00)
Glucose, Bld: 89 mg/dL (ref 70–99)
Potassium: 3.8 mEq/L (ref 3.5–5.1)
Sodium: 139 mEq/L (ref 135–145)
Total Bilirubin: 0.4 mg/dL (ref 0.2–1.2)
Total Protein: 7.4 g/dL (ref 6.0–8.3)

## 2020-09-11 LAB — CBC
HCT: 36 % (ref 36.0–46.0)
Hemoglobin: 12.1 g/dL (ref 12.0–15.0)
MCHC: 33.6 g/dL (ref 30.0–36.0)
MCV: 85.1 fl (ref 78.0–100.0)
Platelets: 195 10*3/uL (ref 150.0–400.0)
RBC: 4.23 Mil/uL (ref 3.87–5.11)
RDW: 14.7 % (ref 11.5–15.5)
WBC: 6.5 10*3/uL (ref 4.0–10.5)

## 2020-09-11 LAB — VITAMIN D 25 HYDROXY (VIT D DEFICIENCY, FRACTURES): VITD: 16.7 ng/mL — ABNORMAL LOW (ref 30.00–100.00)

## 2020-09-12 ENCOUNTER — Telehealth: Payer: Self-pay | Admitting: Family Medicine

## 2020-09-12 ENCOUNTER — Other Ambulatory Visit: Payer: Self-pay | Admitting: Nurse Practitioner

## 2020-09-12 NOTE — Telephone Encounter (Signed)
What is the name of the medication? meclizine (ANTIVERT) 12.5 MG tablet [[id number]]   Have you contacted your pharmacy to request a refill? Pt's daughter called in requesting this for her mom. She stated her mom had just seen by Dr. Salena Saner when I suggested her to make her mom an appointment.  Which pharmacy would you like this sent to? Pharmacy  [id number] [place], [place] - [phone] AT [facility] & [address]  [address] [place] [address]  Phone:  [phone] Fax:  [phone]  DEA #:  [id number]      Patient notified that their request is being sent to the clinical staff for review and that they should receive a call once it is complete. If they do not receive a call within 72 hours they can check with their pharmacy or our office.

## 2020-09-12 NOTE — Telephone Encounter (Signed)
Message has been sent to Dr. Salena Saner.

## 2020-09-12 NOTE — Telephone Encounter (Signed)
Patients daughter is calling back again regarding previous message. States patient needs this medication sent in as soon as possible. Please advise.

## 2020-09-16 NOTE — Telephone Encounter (Signed)
Pt calling today to ask for medication refill.  Pt c/o dizziness/vertigo, which she has been having all weekend.  Please advise.

## 2020-09-18 MED ORDER — MECLIZINE HCL 12.5 MG PO TABS: 12.5000 mg | ORAL_TABLET | Freq: Three times a day (TID) | ORAL | 0 refills | Status: AC | PRN

## 2020-09-18 NOTE — Telephone Encounter (Signed)
Rx sent. It was Rx'd by [facility] in 08/2019 as a PRN med. Please call daughter to make her aware. In the future, if med request is requested urgently by the pt and I am not in the office, please route to covering provider/doc of the day

## 2020-10-08 ENCOUNTER — Other Ambulatory Visit: Payer: Self-pay

## 2020-10-09 ENCOUNTER — Ambulatory Visit (INDEPENDENT_AMBULATORY_CARE_PROVIDER_SITE_OTHER): Payer: 59 | Admitting: Family Medicine

## 2020-10-09 ENCOUNTER — Encounter: Payer: Self-pay | Admitting: Family Medicine

## 2020-10-09 VITALS — BP 130/80 | HR 70 | Temp 98.0°F | Ht 63.0 in | Wt 160.8 lb

## 2020-10-09 DIAGNOSIS — H539 Unspecified visual disturbance: Secondary | ICD-10-CM | POA: Diagnosis not present

## 2020-10-09 NOTE — Progress Notes (Signed)
[facility] [address] [place] [address] Dept: [phone] Dept Fax: [phone]  Office Visit  Subjective:    Patient ID: Karen [name], female    DOB: 17-Apr-1941, 80 y.o..   MRN: [id number]  Chief Complaint  Patient presents with   Acute Visit    C/o having visual disturbances in both eyes x 2-3 days.  Reports feeling like she is seeing through a kaleidoscope.     History of Present Illness:  Patient is in today for evaluation of a recent change in her vision. She notes that starting about 3 months ago, she notes her vision, esp. towards the right visual filed having the appearance of either crystals or falling water. She ahs occasionally had this on the left. She does not notice this every day. She does not feel that her visual acuity has changed. She has needed reading glasses int he past. She denies any eye pain. None of this was associated with any stroke-like symptoms (numbness or weakness of extremities, speech change). She has a history of hypertension and is treated with Hyzaar.  Past Medical History: Patient Active Problem List   Diagnosis Date Noted   Vitamin D deficiency 02/22/2019   Alopecia 05/02/2017   Iron deficiency anemia 08/05/2016   Gastroesophageal reflux disease without esophagitis 08/05/2016   Essential hypertension 12/15/2015   No past surgical history on file.  Family History  Problem Relation Age of Onset   Hypertension Mother    Hyperlipidemia Mother    Hypertension Father    Outpatient Medications Prior to Visit  Medication Sig Dispense Refill   Cholecalciferol 50 MCG (2000 UT) TABS Take 2,000 Units by mouth daily.     ibuprofen (ADVIL) 800 MG tablet Take 1 tablet (800 mg total) by mouth every 8 (eight) hours as needed for moderate pain. 21 tablet 0   losartan-hydrochlorothiazide (HYZAAR) 100-25 MG tablet Take 1 tablet by mouth daily. 90 tablet 3   meclizine (ANTIVERT) 12.5 MG  tablet Take 1 tablet (12.5 mg total) by mouth 3 (three) times daily as needed for dizziness. 60 tablet 0   vitamin C (ASCORBIC ACID) 250 MG tablet Take 250 mg by mouth daily.     No facility-administered medications prior to visit.   No Known Allergies    Objective:   Today's Vitals   10/09/20 0924  BP: 130/80  Pulse: 70  Temp: 98 F (36.7 C)  TempSrc: Temporal  SpO2: 98%  Weight: 160 lb 12.8 oz (72.9 kg)  Height: 5\' 3"  (1.6 m)   Body mass index is 28.48 kg/m.   General: Well developed, well nourished. No acute distress. HEENT: Normocephalic, non-traumatic. PERRL, EOMI. Conjunctiva clear. Anterior chamber is clear. There appears to be some haziness to the lens bilaterally. I am unable to focus on her retina due to the obscurity of the lens. Extremities: Full ROM. No joint swelling or tenderness. No edema noted. Neuro: No focal neurological deficits. Psych: Alert and oriented. Normal mood and affect.  Health Maintenance Due  Topic Date Due   Zoster Vaccines- Shingrix (1 of 2) Never done   TETANUS/TDAP  05/23/2019   COVID-19 Vaccine (3 - Pfizer risk series) 09/21/2019     Assessment & Plan:   1. Change in vision Ms. Mraz's visual change is non-specific. Her lens appears to be cloudy, suggestive of possible progressive cataracts. She has an appointment within 2 weeks with an optometrist. I recommend she keep this appointment. I reassured her that I do  not see any acute/emergent issues.  Loyola Mast, MD

## 2020-10-28 ENCOUNTER — Ambulatory Visit (INDEPENDENT_AMBULATORY_CARE_PROVIDER_SITE_OTHER): Payer: 59 | Admitting: *Deleted

## 2020-10-28 DIAGNOSIS — Z0000 Encounter for general adult medical examination without abnormal findings: Secondary | ICD-10-CM | POA: Diagnosis not present

## 2020-10-28 DIAGNOSIS — Z13820 Encounter for screening for osteoporosis: Secondary | ICD-10-CM | POA: Diagnosis not present

## 2020-10-28 NOTE — Progress Notes (Signed)
Subjective:   Karen [name] is a 80 y.o. female who presents for Medicare Annual (Subsequent) preventive examination.  Review of Systems    NA Cardiac Risk Factors include: advanced age (>87men, >94 women);hypertension     Objective:    Today's Vitals   There is no height or weight on file to calculate BMI.  Advanced Directives 10/28/2020 09/10/2019 08/22/2019 07/19/2016  Does Patient Have a Medical Advance Directive? No No No No  Would patient like information on creating a medical advance directive? No - Patient declined No - Patient declined No - Patient declined -    Current Medications (verified) Outpatient Encounter Medications as of 10/28/2020  Medication Sig   Cholecalciferol 50 MCG (2000 UT) TABS Take 2,000 Units by mouth daily.   ibuprofen (ADVIL) 800 MG tablet Take 1 tablet (800 mg total) by mouth every 8 (eight) hours as needed for moderate pain.   losartan-hydrochlorothiazide (HYZAAR) 100-25 MG tablet Take 1 tablet by mouth daily.   meclizine (ANTIVERT) 12.5 MG tablet Take 1 tablet (12.5 mg total) by mouth 3 (three) times daily as needed for dizziness.   vitamin C (ASCORBIC ACID) 250 MG tablet Take 250 mg by mouth daily.   No facility-administered encounter medications on file as of 10/28/2020.    Allergies (verified) Patient has no known allergies.   History: Past Medical History:  Diagnosis Date   Hypertension    History reviewed. No pertinent surgical history. Family History  Problem Relation Age of Onset   Hypertension Mother    Hyperlipidemia Mother    Hypertension Father    Social History   Socioeconomic History   Marital status: Widowed    Spouse name: Not on file   Number of children: Not on file   Years of education: Not on file   Highest education level: Not on file  Occupational History   Not on file  Tobacco Use   Smoking status: Never   Smokeless tobacco: Never  Vaping Use   Vaping Use: Never used  Substance and Sexual Activity    Alcohol use: No   Drug use: No   Sexual activity: Not Currently    Birth control/protection: None  Other Topics Concern   Not on file  Social History Narrative   Not on file   Social Determinants of Health   Financial Resource Strain: Low Risk    Difficulty of Paying Living Expenses: Not hard at all  Food Insecurity: Not on file  Transportation Needs: Not on file  Physical Activity: Insufficiently Active   Days of Exercise per Week: 4 days   Minutes of Exercise per Session: 30 min  Stress: No Stress Concern Present   Feeling of Stress : Not at all  Social Connections: Moderately Isolated   Frequency of Communication with Friends and Family: More than three times a week   Frequency of Social Gatherings with Friends and Family: Three times a week   Attends Religious Services: More than 4 times per year   Active Member of Clubs or Organizations: No   Attends Banker Meetings: Never   Marital Status: Widowed    Tobacco Counseling Counseling given: Not Answered   Clinical Intake:  Pre-visit preparation completed: Yes  Pain : No/denies pain     Nutritional Risks: None Diabetes: No     Diabetic?   NO  Interpreter Needed?: No  Information entered by :: Remi Haggard LPN   Activities of Daily Living In your present state of health, do  you have any difficulty performing the following activities: 10/28/2020  Hearing? N  Vision? N  Difficulty concentrating or making decisions? N  Walking or climbing stairs? N  Dressing or bathing? N  Doing errands, shopping? N  Preparing Food and eating ? N  Using the Toilet? N  In the past six months, have you accidently leaked urine? N  Do you have problems with loss of bowel control? N  Managing your Medications? N  Managing your Finances? N  Housekeeping or managing your Housekeeping? N  Some recent data might be hidden    Patient Care Team: Overton Mam, DO as PCP - General (Family Medicine)  Indicate  any recent Medical Services you may have received from other than [facility] providers in the past year (date may be approximate).     Assessment:   This is a routine wellness examination for Karen [name].  Hearing/Vision screen Hearing Screening - Comments:: No trouble hearing  Dietary issues and exercise activities discussed:     Goals Addressed             This Visit's Progress    DIET - INCREASE WATER INTAKE   On track    Patient Stated       Would like to increase physical activity        Depression Screen PHQ 2/9 Scores 10/28/2020 04/08/2020 08/22/2019 12/06/2015  PHQ - 2 Score 0 0 0 0    Fall Risk Fall Risk  10/28/2020 04/08/2020 08/22/2019 05/10/2019 12/06/2015  Falls in the past year? 0 0 0 0 No  Number falls in past yr: 0 - 0 - -  Injury with Fall? 0 - 0 - -  Follow up Falls evaluation completed;Falls prevention discussed - Education provided;Falls prevention discussed - -    FALL RISK PREVENTION PERTAINING TO THE HOME:  Any stairs in or around the home? No  If so, are there any without handrails? No  Home free of loose throw rugs in walkways, pet beds, electrical cords, etc? Yes  Adequate lighting in your home to reduce risk of falls? Yes   ASSISTIVE DEVICES UTILIZED TO PREVENT FALLS:  Life alert? No  Use of a cane, walker or w/c? No  Grab bars in the bathroom? Yes  Shower chair or bench in shower? Yes  Elevated toilet seat or a handicapped toilet? No   TIMED UP AND GO:  Was the test performed? No .  Tele-Health  Cognitive Function:  Normal cognitive status assessed by direct observation by this Nurse Health Advisor. No abnormalities found.          Immunizations Immunization History  Administered Date(s) Administered   PFIZER(Purple Top)SARS-COV-2 Vaccination 07/27/2019, 08/24/2019   Pneumococcal Conjugate-13 12/15/2015   Pneumococcal Polysaccharide-23 05/22/2009   Tdap 05/22/2009    TDAP status: Up to date  Flu Vaccine status: Due, Education has  been provided regarding the importance of this vaccine. Advised may receive this vaccine at local pharmacy or Health Dept. Aware to provide a copy of the vaccination record if obtained from local pharmacy or Health Dept. Verbalized acceptance and understanding.  Pneumococcal vaccine status: Up to date  Covid-19 vaccine status: Information provided on how to obtain vaccines.   Qualifies for Shingles Vaccine? Yes   Zostavax completed No   Shingrix Completed?: No.    Education has been provided regarding the importance of this vaccine. Patient has been advised to call insurance company to determine out of pocket expense if they have not yet received this vaccine.  Advised may also receive vaccine at local pharmacy or Health Dept. Verbalized acceptance and understanding.  Screening Tests Health Maintenance  Topic Date Due   Zoster Vaccines- Shingrix (1 of 2) Never done   TETANUS/TDAP  05/23/2019   COVID-19 Vaccine (3 - Pfizer risk series) 09/21/2019   INFLUENZA VACCINE  11/17/2020   DEXA SCAN  Completed   PNA vac Low Risk Adult  Completed   HPV VACCINES  Aged Out    Health Maintenance  Health Maintenance Due  Topic Date Due   Zoster Vaccines- Shingrix (1 of 2) Never done   TETANUS/TDAP  05/23/2019   COVID-19 Vaccine (3 - Pfizer risk series) 09/21/2019    Colonoscopoy  Patient educated /did not order at this time  Mammogram status: No longer required due to age.  Bone Density status: Ordered  . Pt provided with contact info and advised to call to schedule appt.  Lung Cancer Screening: (Low Dose CT Chest recommended if Age 3-80 years, 30 pack-year currently smoking OR have quit w/in 15years.) does not qualify.   Lung Cancer Screening Referral: na  Additional Screening:  Hepatitis C Screening: does not qualify; Completed 12-22-2018  Vision Screening: Recommended annual ophthalmology exams for early detection of glaucoma and other disorders of the eye. Is the patient up to date  with their annual eye exam?  Yes  Who is the provider or what is the name of the office in which the patient attends annual eye exams? Patient unsure of name  If pt is not established with a provider, would they like to be referred to a provider to establish care?  established .   Dental Screening: Recommended annual dental exams for proper oral hygiene  Community Resource Referral / Chronic Care Management: CRR required this visit?  No   CCM required this visit?  No      Plan:     I have personally reviewed and noted the following in the patient's chart:   Medical and social history Use of alcohol, tobacco or illicit drugs  Current medications and supplements including opioid prescriptions.  Functional ability and status Nutritional status Physical activity Advanced directives List of other physicians Hospitalizations, surgeries, and ER visits in previous 12 months Vitals Screenings to include cognitive, depression, and falls Referrals and appointments  In addition, I have reviewed and discussed with patient certain preventive protocols, quality metrics, and best practice recommendations. A written personalized care plan for preventive services as well as general preventive health recommendations were provided to patient.     Remi Haggard, LPN   9/[id number]   Nurse Notes: na

## 2020-10-28 NOTE — Patient Instructions (Signed)
Karen [name] , Thank you for taking time to come for your Medicare Wellness Visit. I appreciate your ongoing commitment to your health goals. Please review the following plan we discussed and let me know if I can assist you in the future.   Screening recommendations/referrals: Colonoscopy: no longer recommended Mammogram: no longer recommended Bone Density: ordered Recommended yearly ophthalmology/optometry visit for glaucoma screening and checkup Recommended yearly dental visit for hygiene and checkup  Vaccinations: Influenza vaccine: up to date Pneumococcal vaccine: up to date Tdap vaccine: education provided Shingles vaccine: education provided     Advanced directives: education provided  Conditions/risks identified: na  Next appointment: 11-01-2018 @10 :00 am   Dr.   Preventive Care 60 Years and Older, Female Preventive care refers to lifestyle choices and visits with your health care provider that can promote health and wellness. What does preventive care include? A yearly physical exam. This is also called an annual well check. Dental exams once or twice a year. Routine eye exams. Ask your health care provider how often you should have your eyes checked. Personal lifestyle choices, including: Daily care of your teeth and gums. Regular physical activity. Eating a healthy diet. Avoiding tobacco and drug use. Limiting alcohol use. Practicing safe sex. Taking low-dose aspirin every day. Taking vitamin and mineral supplements as recommended by your health care provider. What happens during an annual well check? The services and screenings done by your health care provider during your annual well check will depend on your age, overall health, lifestyle risk factors, and family history of disease. Counseling  Your health care provider may ask you questions about your: Alcohol use. Tobacco use. Drug use. Emotional well-being. Home and relationship  well-being. Sexual activity. Eating habits. History of falls. Memory and ability to understand (cognition). Work and work 76. Reproductive health. Screening  You may have the following tests or measurements: Height, weight, and BMI. Blood pressure. Lipid and cholesterol levels. These may be checked every 5 years, or more frequently if you are over 32 years old. Skin check. Lung cancer screening. You may have this screening every year starting at age 51 if you have a 30-pack-year history of smoking and currently smoke or have quit within the past 15 years. Fecal occult blood test (FOBT) of the stool. You may have this test every year starting at age 79. Flexible sigmoidoscopy or colonoscopy. You may have a sigmoidoscopy every 5 years or a colonoscopy every 10 years starting at age 23. Hepatitis C blood test. Hepatitis B blood test. Sexually transmitted disease (STD) testing. Diabetes screening. This is done by checking your blood sugar (glucose) after you have not eaten for a while (fasting). You may have this done every 1-3 years. Bone density scan. This is done to screen for osteoporosis. You may have this done starting at age 26. Mammogram. This may be done every 1-2 years. Talk to your health care provider about how often you should have regular mammograms. Talk with your health care provider about your test results, treatment options, and if necessary, the need for more tests. Vaccines  Your health care provider may recommend certain vaccines, such as: Influenza vaccine. This is recommended every year. Tetanus, diphtheria, and acellular pertussis (Tdap, Td) vaccine. You may need a Td booster every 10 years. Zoster vaccine. You may need this after age 26. Pneumococcal 13-valent conjugate (PCV13) vaccine. One dose is recommended after age 100. Pneumococcal polysaccharide (PPSV23) vaccine. One dose is recommended after age 33. Talk to your health care  provider about which  screenings and vaccines you need and how often you need them. This information is not intended to replace advice given to you by your health care provider. Make sure you discuss any questions you have with your health care provider. Document Released: 05/02/2015 Document Revised: 12/24/2015 Document Reviewed: 02/04/2015 Elsevier Interactive Patient Education  2017 [place] Prevention in the Home Falls can cause injuries. They can happen to people of all ages. There are many things you can do to make your home safe and to help prevent falls. What can I do on the outside of my home? Regularly fix the edges of walkways and driveways and fix any cracks. Remove anything that might make you trip as you walk through a door, such as a raised step or threshold. Trim any bushes or trees on the path to your home. Use bright outdoor lighting. Clear any walking paths of anything that might make someone trip, such as rocks or tools. Regularly check to see if handrails are loose or broken. Make sure that both sides of any steps have handrails. Any raised decks and porches should have guardrails on the edges. Have any leaves, snow, or ice cleared regularly. Use sand or salt on walking paths during winter. Clean up any spills in your garage right away. This includes oil or grease spills. What can I do in the bathroom? Use night lights. Install grab bars by the toilet and in the tub and shower. Do not use towel bars as grab bars. Use non-skid mats or decals in the tub or shower. If you need to sit down in the shower, use a plastic, non-slip stool. Keep the floor dry. Clean up any water that spills on the floor as soon as it happens. Remove soap buildup in the tub or shower regularly. Attach bath mats securely with double-sided non-slip rug tape. Do not have throw rugs and other things on the floor that can make you trip. What can I do in the bedroom? Use night lights. Make sure that you have a  light by your bed that is easy to reach. Do not use any sheets or blankets that are too big for your bed. They should not hang down onto the floor. Have a firm chair that has side arms. You can use this for support while you get dressed. Do not have throw rugs and other things on the floor that can make you trip. What can I do in the kitchen? Clean up any spills right away. Avoid walking on wet floors. Keep items that you use a lot in easy-to-reach places. If you need to reach something above you, use a strong step stool that has a grab bar. Keep electrical cords out of the way. Do not use floor polish or wax that makes floors slippery. If you must use wax, use non-skid floor wax. Do not have throw rugs and other things on the floor that can make you trip. What can I do with my stairs? Do not leave any items on the stairs. Make sure that there are handrails on both sides of the stairs and use them. Fix handrails that are broken or loose. Make sure that handrails are as long as the stairways. Check any carpeting to make sure that it is firmly attached to the stairs. Fix any carpet that is loose or worn. Avoid having throw rugs at the top or bottom of the stairs. If you do have throw rugs, attach them to the  floor with carpet tape. Make sure that you have a light switch at the top of the stairs and the bottom of the stairs. If you do not have them, ask someone to add them for you. What else can I do to help prevent falls? Wear shoes that: Do not have high heels. Have rubber bottoms. Are comfortable and fit you well. Are closed at the toe. Do not wear sandals. If you use a stepladder: Make sure that it is fully opened. Do not climb a closed stepladder. Make sure that both sides of the stepladder are locked into place. Ask someone to hold it for you, if possible. Clearly mark and make sure that you can see: Any grab bars or handrails. First and last steps. Where the edge of each step  is. Use tools that help you move around (mobility aids) if they are needed. These include: Canes. Walkers. Scooters. Crutches. Turn on the lights when you go into a dark area. Replace any light bulbs as soon as they burn out. Set up your furniture so you have a clear path. Avoid moving your furniture around. If any of your floors are uneven, fix them. If there are any pets around you, be aware of where they are. Review your medicines with your doctor. Some medicines can make you feel dizzy. This can increase your chance of falling. Ask your doctor what other things that you can do to help prevent falls. This information is not intended to replace advice given to you by your health care provider. Make sure you discuss any questions you have with your health care provider. Document Released: 01/30/2009 Document Revised: 09/11/2015 Document Reviewed: 05/10/2014 Elsevier Interactive Patient Education  2017 [employer].

## 2020-10-31 ENCOUNTER — Ambulatory Visit: Payer: 59 | Admitting: Family Medicine

## 2020-10-31 ENCOUNTER — Telehealth: Payer: Self-pay

## 2020-10-31 DIAGNOSIS — E559 Vitamin D deficiency, unspecified: Secondary | ICD-10-CM

## 2020-10-31 NOTE — Telephone Encounter (Signed)
Pt is calling about a prescription she says Dr C was going to send for her for VitD3. I didn't see that in her chart. I asked her if she was suppose to buy it otc. She said it was a pill that was to be taken once/week.  Please advise, Thank you

## 2020-11-03 NOTE — Telephone Encounter (Signed)
I don't see where it is noted that you prescribed the patient Vitamin D but I see it from a previous provider. Please advise.

## 2020-11-05 MED ORDER — VITAMIN D (ERGOCALCIFEROL) 1.25 MG (50000 UNIT) PO CAPS: 50000.0000 [IU] | ORAL_CAPSULE | ORAL | 2 refills | Status: DC

## 2020-11-05 NOTE — Addendum Note (Signed)
Addended by: Overton Mam on: 11/05/2020 10:03 AM   Modules accepted: Orders

## 2020-11-05 NOTE — Telephone Encounter (Signed)
Overton Mam, DO  09/12/2020  7:45 AM EDT      Please call Karen [name] to let her know ... 1. Blood sugar, kidney and liver function, blood count are normal 2. Vit D is low at 16, normal is above 30. I'm going to send an Rx for Vit D 50,000IU 1 cap per week x 16 weeks. After that time she can switch to and OTCVit D supplement 2000-4000IU daily. 3. Her cholesterol and triglycerides are elevated. I would recommend a low fat diet and regular walking or other cardiovascular exercise 3-5x/wk in order tolower these values  I will send Rx for Vit D 50,000IU weekly now to pharm. Please call Karen [name] to make her aware

## 2020-11-06 NOTE — Telephone Encounter (Signed)
Pt and son notified.

## 2021-01-21 LAB — HM DIABETES EYE EXAM

## 2021-01-22 ENCOUNTER — Encounter: Payer: Self-pay | Admitting: Family Medicine

## 2021-02-12 ENCOUNTER — Telehealth: Payer: Self-pay | Admitting: Family Medicine

## 2021-02-12 DIAGNOSIS — I10 Essential (primary) hypertension: Secondary | ICD-10-CM

## 2021-02-12 MED ORDER — LOSARTAN POTASSIUM-HCTZ 100-25 MG PO TABS: 1.0000 | ORAL_TABLET | Freq: Every day | ORAL | 1 refills | Status: DC

## 2021-02-12 NOTE — Telephone Encounter (Signed)
What is the name of the medication? losartan-hydrochlorothiazide (HYZAAR) 100-25 MG tablet [[id number]]   Have you contacted your pharmacy to request a refill? Pt is needing a refill, she has an appointment with Dr. Veto Kemps on 02/16/21.  Which pharmacy would you like this sent to? [id number] [place], [place] - [phone] AT [facility] & [address]  [address] [place] [address]  Phone:  [phone]  Fax:  [phone]  DEA #:  [id number]  Patient notified that their request is being sent to the clinical staff for review and that they should receive a call once it is complete. If they do not receive a call within 72 hours they can check with their pharmacy or our office.

## 2021-02-16 ENCOUNTER — Ambulatory Visit: Payer: 59 | Admitting: Family Medicine

## 2021-02-23 ENCOUNTER — Telehealth: Payer: Self-pay | Admitting: Family Medicine

## 2021-02-23 DIAGNOSIS — Z1231 Encounter for screening mammogram for malignant neoplasm of breast: Secondary | ICD-10-CM

## 2021-02-23 NOTE — Telephone Encounter (Signed)
Pt states she is past due for screening mammogram. Raynelle Fanning, [facility]  already put in order for bone density. Can order be placed for Mammogram? Pt is scheduled for visit 05/19/21 (due to provider out of office).

## 2021-02-24 NOTE — Telephone Encounter (Signed)
Order entered into chart.Marland Kitchen  Dm/cma

## 2021-03-14 ENCOUNTER — Encounter (HOSPITAL_COMMUNITY): Payer: Self-pay

## 2021-03-14 ENCOUNTER — Other Ambulatory Visit: Payer: Self-pay

## 2021-03-14 ENCOUNTER — Ambulatory Visit (HOSPITAL_COMMUNITY)
Admission: EM | Admit: 2021-03-14 | Discharge: 2021-03-14 | Disposition: A | Discharge status: Home / Self Care | Payer: 59 | Attending: Urgent Care | Admitting: Urgent Care

## 2021-03-14 DIAGNOSIS — L03213 Periorbital cellulitis: Secondary | ICD-10-CM | POA: Diagnosis not present

## 2021-03-14 DIAGNOSIS — H0289 Other specified disorders of eyelid: Secondary | ICD-10-CM

## 2021-03-14 DIAGNOSIS — H5789 Other specified disorders of eye and adnexa: Secondary | ICD-10-CM | POA: Diagnosis not present

## 2021-03-14 MED ORDER — CEFDINIR 300 MG PO CAPS: 300.0000 mg | ORAL_CAPSULE | Freq: Two times a day (BID) | ORAL | 0 refills | Status: DC

## 2021-03-14 MED ORDER — TOBRAMYCIN 0.3 % OP SOLN: 1.0000 [drp] | OPHTHALMIC | 0 refills | Status: DC

## 2021-03-14 NOTE — ED Provider Notes (Signed)
[facility]   MRN: [id number] DOB: 1941/04/06  Subjective:   Karen [name] is a 80 y.o. female presenting for 3-day history of acute onset left eye redness, eyelid pain, drainage of her eye.  Has had occasional blurred vision but just has to blink and she recovers it.  No tunnel vision, eye trauma, contact lens use.  Denies history of glaucoma.  She does have a history of hypertension and forgot to take her medications today.  She also has a very close family member that suffered a severe car accident today and is very stressed out about that too.  Denies headache, confusion, weakness, numbness or tingling, chest pain, heart racing, nausea, vomiting, abdominal pain.  No current facility-administered medications for this encounter.  Current Outpatient Medications:    Cholecalciferol 50 MCG (2000 UT) TABS, Take 2,000 Units by mouth daily., Disp: , Rfl:    ibuprofen (ADVIL) 800 MG tablet, Take 1 tablet (800 mg total) by mouth every 8 (eight) hours as needed for moderate pain., Disp: 21 tablet, Rfl: 0   losartan-hydrochlorothiazide (HYZAAR) 100-25 MG tablet, Take 1 tablet by mouth daily., Disp: 90 tablet, Rfl: 1   meclizine (ANTIVERT) 12.5 MG tablet, Take 1 tablet (12.5 mg total) by mouth 3 (three) times daily as needed for dizziness., Disp: 60 tablet, Rfl: 0   vitamin C (ASCORBIC ACID) 250 MG tablet, Take 250 mg by mouth daily., Disp: , Rfl:    Vitamin D, Ergocalciferol, (DRISDOL) 1.25 MG (50000 UNIT) CAPS capsule, Take 1 capsule (50,000 Units total) by mouth every 7 (seven) days., Disp: 5 capsule, Rfl: 2   No Known Allergies  Past Medical History:  Diagnosis Date   Hypertension      No past surgical history on file.  Family History  Problem Relation Age of Onset   Hypertension Mother    Hyperlipidemia Mother    Hypertension Father     Social History   Tobacco Use   Smoking status: Never   Smokeless tobacco: Never  Vaping Use   Vaping Use: Never used   Substance Use Topics   Alcohol use: No   Drug use: No    ROS   Objective:   Vitals: BP (!) 173/105 Comment: pt reports not taking medication today  Pulse 61   Temp 98.7 F (37.1 C) (Oral)   Resp 20   SpO2 100%   Physical Exam Constitutional:      General: She is not in acute distress.    Appearance: Normal appearance. She is well-developed. She is not ill-appearing, toxic-appearing or diaphoretic.  HENT:     Head: Normocephalic and atraumatic.     Nose: Nose normal.     Mouth/Throat:     Mouth: Mucous membranes are moist.  Eyes:     General: Lids are everted, no foreign bodies appreciated. No visual field deficit.       Right eye: No foreign body, discharge or hordeolum.        Left eye: Discharge (clear) present.No foreign body or hordeolum.     Extraocular Movements: Extraocular movements intact.     Conjunctiva/sclera:     Right eye: Right conjunctiva is not injected. No chemosis, exudate or hemorrhage.    Left eye: Left conjunctiva is injected. No chemosis, exudate or hemorrhage.    Pupils: Pupils are equal, round, and reactive to light.  Cardiovascular:     Rate and Rhythm: Normal rate and regular rhythm.     Pulses: Normal pulses.  Heart sounds: Normal heart sounds. No murmur heard.   No friction rub. No gallop.  Pulmonary:     Effort: Pulmonary effort is normal. No respiratory distress.     Breath sounds: Normal breath sounds. No stridor. No wheezing, rhonchi or rales.  Skin:    General: Skin is warm and dry.     Findings: No rash.  Neurological:     Mental Status: She is alert and oriented to person, place, and time.     Cranial Nerves: No cranial nerve deficit.     Motor: No weakness.     Coordination: Coordination normal.     Gait: Gait normal.     Deep Tendon Reflexes: Reflexes normal.  Psychiatric:        Mood and Affect: Mood normal.        Behavior: Behavior normal.        Thought Content: Thought content normal.        Judgment: Judgment  normal.    Eye Exam: Eyelids everted and swept for foreign body. The eye was anesthetized with 2 drops of tetracaine and stained with fluorescein. Examination under woods lamp does not reveal a foreign body or area of increased stain uptake. The eye was then irrigated copiously with saline.   Assessment and Plan :   PDMP not reviewed this encounter.  1. Preseptal cellulitis of left lower eyelid   2. Redness of left eye   3. Eyelid pain, left    Sent on possibility of glaucoma and emphasized need to follow-up with an ophthalmologist.  Provided her with information to the ophthalmologist on-call, Dr. Charlotte Sanes.  In the meantime, will cover for preseptal cellulitis with cefdinir and tobramycin eyedrops.  Emphasized need to follow-up with her PCP for recheck on her blood pressure, also reviewed compliance with her medications. Counseled patient on potential for adverse effects with medications prescribed/recommended today, ER and return-to-clinic precautions discussed, patient verbalized understanding.    Wallis Bamberg, PA-C 03/14/21 1704

## 2021-03-14 NOTE — ED Triage Notes (Signed)
Pt presented to the office for left eye pain and redness.

## 2021-03-17 LAB — HM DIABETES EYE EXAM

## 2021-05-12 ENCOUNTER — Ambulatory Visit
Admission: RE | Admit: 2021-05-12 | Discharge: 2021-05-12 | Disposition: A | Discharge status: Home / Self Care | Payer: 59 | Source: Ambulatory Visit | Attending: Family Medicine | Admitting: Family Medicine

## 2021-05-12 DIAGNOSIS — Z1231 Encounter for screening mammogram for malignant neoplasm of breast: Secondary | ICD-10-CM

## 2021-05-18 ENCOUNTER — Other Ambulatory Visit: Payer: Self-pay

## 2021-05-19 ENCOUNTER — Ambulatory Visit (INDEPENDENT_AMBULATORY_CARE_PROVIDER_SITE_OTHER): Payer: 59 | Admitting: Family Medicine

## 2021-05-19 ENCOUNTER — Encounter: Payer: Self-pay | Admitting: Family Medicine

## 2021-05-19 VITALS — BP 120/76 | HR 68 | Temp 97.6°F | Ht 63.0 in | Wt 159.6 lb

## 2021-05-19 DIAGNOSIS — I10 Essential (primary) hypertension: Secondary | ICD-10-CM

## 2021-05-19 DIAGNOSIS — M2392 Unspecified internal derangement of left knee: Secondary | ICD-10-CM

## 2021-05-19 DIAGNOSIS — M810 Age-related osteoporosis without current pathological fracture: Secondary | ICD-10-CM | POA: Diagnosis not present

## 2021-05-19 MED ORDER — ALENDRONATE SODIUM 70 MG PO TABS: 70.0000 mg | ORAL_TABLET | ORAL | 11 refills | Status: DC

## 2021-05-19 NOTE — Progress Notes (Signed)
[facility] [address] [place] [address] Dept: [phone] Dept Fax: [phone]  New Patient Office Visit  Subjective:    Patient ID: Karen [name], female    DOB: 05/31/1940, 81 y.o..   MRN: [id number]  Chief Complaint  Patient presents with   Follow-up    F/u meds. C/o having LT knee pain x 2 days (on/off) has been using pain patches and spray with some relief.     History of Present Illness:  Patient is in today to establish care. Ms. Priem was born in [place]. She came to the [place] at age 66 to [place]. She is a widow. She has 7 children, 10 grandchildren, and 5 great grandchildren. She denies tobacco use.  Ms. Odem has a history of hypertension. She is managed with Hyzaar (lisinopril-HCTZ).  Ms. Skidmore notes that over the past few days,s he has been having some left knee pain. This is mostly int he front. It does not bother her when she is sitting, but is painful with walking, esp. longer distances. She admits to some popping int he knee. She has used topical sprays and patches for pain management.  Ms. Habecker had a recent mammogram and bone density study. She also had a insurance company review. They found her QuantaFlo to be normal in her right leg (1.08) and borderline low in her left leg (0.93).  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 No pertinent surgical history.  Family History  Problem Relation Age of Onset   Hypertension Mother    Hyperlipidemia Mother    Hypertension Father    Outpatient Medications Prior to Visit  Medication Sig Dispense Refill   Cholecalciferol 50  MCG (2000 UT) TABS Take 2,000 Units by mouth daily.     ibuprofen (ADVIL) 800 MG tablet Take 1 tablet (800 mg total) by mouth every 8 (eight) hours as needed for moderate pain. 21 tablet 0   losartan-hydrochlorothiazide (HYZAAR) 100-25 MG tablet Take 1 tablet by mouth daily. 90 tablet 1   meclizine (ANTIVERT) 12.5 MG tablet Take 1 tablet (12.5 mg total) by mouth 3 (three) times daily as needed for dizziness. 60 tablet 0   tobramycin (TOBREX) 0.3 % ophthalmic solution Place 1 drop into the left eye every 4 (four) hours. 5 mL 0   vitamin C (ASCORBIC ACID) 250 MG tablet Take 250 mg by mouth daily.     cefdinir (OMNICEF) 300 MG capsule Take 1 capsule (300 mg total) by mouth 2 (two) times daily. 20 capsule 0   Vitamin D, Ergocalciferol, (DRISDOL) 1.25 MG (50000 UNIT) CAPS capsule Take 1 capsule (50,000 Units total) by mouth every 7 (seven) days. 5 capsule 2   No facility-administered medications prior to visit.   No Known Allergies    Objective:   Today's Vitals   05/19/21 0909  BP: 120/76  Pulse: 68  Temp: 97.6 F (36.4 C)  TempSrc: Temporal  SpO2: 98%  Weight: 159 lb 9.6 oz (72.4 kg)  Height: 5\' 3"  (1.6 m)   Body mass index is 28.27 kg/m.   General: Well developed, well nourished. No acute distress. Extremities: Full ROM. No joint swelling or tenderness.  Varus/valgus and  Lachman's testing   are normal. There is popping along the lateral joint line with McMurray's, not intensified by   internal or external rotation. Psych: Alert and oriented. Normal mood and affect.  Health Maintenance Due  Topic Date Due   Zoster Vaccines- Shingrix (1 of 2) Never done   TETANUS/TDAP  05/23/2019   COVID-19 Vaccine (3 - Pfizer risk series) 09/21/2019   INFLUENZA VACCINE  Never done   Imaging: Bone Density (05/12/2021) ASSESSMENT: The BMD measured at Femur Total Left is 0.694 g/cm2 with a T-score of -2.5. This patient is considered osteoporotic according to [facility])  criteria.   The quality of the exam is good. The lumbar spine was excluded due to degenerative changes and scoliosis.  Site Region Measured Date Measured Age YA BMD Significant CHANGE T-score DualFemur Total Left 05/12/2021 81.3 -2.5 0.694 g/cm2   DualFemur Total Mean 05/12/2021 81.3 -2.4 0.705 g/cm2   Left Forearm Radius 33% 05/12/2021 81.3 -2.3 0.675 g/cm2  Lab Results Last CBC Lab Results  Component Value Date   WBC 6.5 09/10/2020   HGB 12.1 09/10/2020   HCT 36.0 09/10/2020   MCV 85.1 09/10/2020   MCH 27.6 09/10/2019   RDW 14.7 09/10/2020   PLT 195.0 [id number]   Last metabolic panel Lab Results  Component Value Date   GLUCOSE 89 09/10/2020   NA 139 09/10/2020   K 3.8 09/10/2020   CL 102 09/10/2020   CO2 30 09/10/2020   BUN 18 09/10/2020   CREATININE 0.76 09/10/2020   GFRNONAA >60 09/10/2019   CALCIUM 9.6 09/10/2020   PROT 7.4 09/10/2020   ALBUMIN 4.1 09/10/2020   BILITOT 0.4 09/10/2020   ALKPHOS 73 09/10/2020   AST 17 09/10/2020   ALT 13 09/10/2020   ANIONGAP 10 09/10/2019   Last lipids Lab Results  Component Value Date   CHOL 214 (H) 09/10/2020   HDL 61.10 09/10/2020   LDLCALC 119 (H) 09/10/2020   LDLDIRECT 112 (H) 10/26/2007   TRIG 170.0 (H) 09/10/2020   CHOLHDL 4 09/10/2020   Last vitamin D Lab Results  Component Value Date   VD25OH 16.70 (L) 09/10/2020     Assessment & Plan:   1. Essential hypertension Blood pressure is in good control. Continue Hyzaar. I will see her back in 3 months and plan lab follow-up.  2. Age-related osteoporosis without current pathological fracture We reviewed the results of her DXA scan, which shows osteoporosis. I recommend she add a daily calcium supplement (she is already on a Vit. D supplement). We also discussed the addition of Fosamax to reduce fracture risk.  - alendronate (FOSAMAX) 70 MG tablet; Take 1 tablet (70 mg total) by mouth every 7 (seven) days. Take with a full glass of water on an empty stomach.   Dispense: 4 tablet; Refill: 11  3. Internal derangement of left knee I suspect she has some chronic wear on the lateral meniscus. I recommend conservative treatment with relative rest, heat as needed, use of a knee brace, and continued topical for pain. If not improving with these approaches, I would consider referral to orthopedics for other options.  [name], MD

## 2021-05-19 NOTE — Patient Instructions (Signed)
Chronic Knee Pain, Adult Chronic knee pain is pain in one or both knees that lasts longer than 3 months. Symptoms of chronic knee pain may include swelling, stiffness, and discomfort. Age-related wear and tear (osteoarthritis) of the knee joint is the most common cause of chronic knee pain. Other possible causes include: A long-term immune-related disease that causes inflammation of the knee (rheumatoid arthritis). This usually affects both knees. Inflammatory arthritis, such as gout or pseudogout. An injury to the knee that causes arthritis. An injury to the knee that damages the ligaments. Ligaments are strong tissues that connect bones to each other. Runner's knee or pain behind the kneecap. Treatment for chronic knee pain depends on the cause. The main treatments for chronic knee pain are physical therapy and weight loss. This condition may also be treated with medicines, injections, a knee sleeve or brace, and by using crutches. Rest, ice, pressure (compression), and elevation, also known as RICE therapy, may also be recommended. Follow these instructions at home: If you have a knee sleeve or brace:  Wear the knee sleeve or brace as told by your health care provider. Remove it only as told by your health care provider. Loosen it if your toes tingle, become numb, or turn cold and blue. Keep it clean. If the sleeve or brace is not waterproof: Do not let it get wet. Remove it if allowed by your health care provider, or cover it with a watertight covering when you take a bath or a shower. Managing pain, stiffness, and swelling   If directed, apply heat to the affected area as often as told by your health care provider. Use the heat source that your health care provider recommends, such as a moist heat pack or a heating pad. If you have a removable knee sleeve or brace, remove it as told by your health care provider. Place a towel between your skin and the heat source. Leave the heat on for  20-30 minutes. Remove the heat if your skin turns bright red. This is especially important if you are unable to feel pain, heat, or cold. You may have a greater risk of getting burned. If directed, put ice on the affected area. To do this: If you have a removable knee sleeve or brace, remove it as told by your health care provider. Put ice in a plastic bag. Place a towel between your skin and the bag. Leave the ice on for 20 minutes, 2-3 times a day. Remove the ice if your skin turns bright red. This is very important. If you cannot feel pain, heat, or cold, you have a greater risk of damage to the area. Move your toes often to reduce stiffness and swelling. Raise (elevate) the injured area above the level of your heart while you are sitting or lying down. Activity Avoid high-impact activities or exercises, such as running, jumping rope, or doing jumping jacks. Follow the exercise plan that your health care provider designed for you. Your health care provider may suggest that you: Avoid activities that make knee pain worse. This may require you to change your exercise routines, sport participation, or job duties. Wear shoes with cushioned soles. Avoid sports that require running and sudden changes in direction. Do physical therapy. Physical therapy is planned to match your needs and abilities. It may include exercises for strength, flexibility, stability, and endurance. Do exercises that increase balance and strength, such as tai chi and yoga. Do not use the injured limb to support your body weight  until your health care provider says that you can. Use crutches as told by your health care provider. Return to your normal activities as told by your health care provider. Ask your health care provider what activities are safe for you. General instructions Take over-the-counter and prescription medicines only as told by your health care provider. Lose weight if you are overweight. Losing even a  little weight can reduce knee pain. Ask your health care provider what your ideal weight is, and how to safely lose extra weight. A dietitian may be able to help you plan your meals. Do not use any products that contain nicotine or tobacco, such as cigarettes, e-cigarettes, and chewing tobacco. These can delay healing. If you need help quitting, ask your health care provider. Keep all follow-up visits. This is important. Contact a health care provider if: You have knee pain that is not getting better or gets worse. You are unable to do your physical therapy exercises due to knee pain. Get help right away if: Your knee swells and the swelling becomes worse. You cannot move your knee. You have severe knee pain. Summary Knee pain that lasts more than 3 months is considered chronic knee pain. The main treatments for chronic knee pain are physical therapy and weight loss. You may also need to take medicines, wear a knee sleeve or brace, use crutches, and apply ice or heat. Losing even a little weight can reduce knee pain. Ask your health care provider what your ideal weight is, and how to safely lose extra weight. A dietitian may be able to help you plan your meals. Follow the exercise plan that your health care provider designed for you. This information is not intended to replace advice given to you by your health care provider. Make sure you discuss any questions you have with your health care provider. Document Revised: 09/19/2019 Document Reviewed: 09/19/2019 Elsevier Patient Education  2022 [employer].

## 2021-05-22 ENCOUNTER — Telehealth: Payer: Self-pay | Admitting: Family Medicine

## 2021-05-22 NOTE — Telephone Encounter (Signed)
Please review and advise. Thanks. Dm/cma  

## 2021-05-25 NOTE — Telephone Encounter (Signed)
Shanda Bumps returned call. Please call cell [phone]

## 2021-05-25 NOTE — Telephone Encounter (Signed)
Lft VM on patients daughters # to rtn call. Dm/cma °

## 2021-05-25 NOTE — Telephone Encounter (Signed)
Lft VM on patients daughters # to rtn call. Dm/cma

## 2021-05-28 ENCOUNTER — Other Ambulatory Visit: Payer: Self-pay

## 2021-05-28 NOTE — Telephone Encounter (Signed)
Spoke to [name], patients daughter, advised on recommendations. She stated that her Mom, mentioned again that the chest discomfort/pain came back yesterday. Patients stress level  is increased right now due to her son issues.  Advised to schedule an appointment to come in for an appointment, she is agreeable and scheduled for 05/29/21 at 9:00. Dm/cma

## 2021-05-29 ENCOUNTER — Ambulatory Visit (INDEPENDENT_AMBULATORY_CARE_PROVIDER_SITE_OTHER): Payer: 59 | Admitting: Family Medicine

## 2021-05-29 VITALS — BP 134/80 | HR 67 | Temp 96.9°F | Ht 63.0 in | Wt 160.2 lb

## 2021-05-29 DIAGNOSIS — M19041 Primary osteoarthritis, right hand: Secondary | ICD-10-CM | POA: Diagnosis not present

## 2021-05-29 DIAGNOSIS — R0789 Other chest pain: Secondary | ICD-10-CM | POA: Diagnosis not present

## 2021-05-29 MED ORDER — NAPROXEN 500 MG PO TABS: 500.0000 mg | ORAL_TABLET | Freq: Two times a day (BID) | ORAL | 0 refills | Status: DC

## 2021-05-29 NOTE — Patient Instructions (Signed)
Alendronate; Cholecalciferol Oral Tablets Qu es este medicamento? El ALENDRONATO; COLECALCIFEROL desacelera la prdida de calcio de los [place]. Se [place] para tratar la osteoporosis. Se puede usar en otras personas con riesgo de prdida de masa sea. Este medicamento puede ser utilizado para otros usos; si tiene alguna pregunta consulte con su proveedor de atencin mdica o con su farmacutico. MARCAS COMUNES: Fosamax Plus D Qu le debo informar a mi profesional de la salud antes de tomar este medicamento? Necesitan saber si usted presenta alguno de los siguientes problemas o situaciones: trastorno de sangrado cncer enfermedad dental dificultad para tragar niveles altos de vitamina D en la sangre infeccin (fiebre, escalofros, tos, dolor de garganta, dolor o dificultad para Geographical information systems officer) enfermedad renal niveles bajos de calcio u otros minerales en la sangre cantidad baja de glbulos rojos recibe medicamentos esteroideos tales como dexametasona o prednisona problemas estomacales o intestinales problemas para sentarse o pararse durante 30 minutos una reaccin alrgica o inusual al alendronato, al colecalciferol, a otros frmacos, alimentos, colorantes o conservantes si est embarazada o buscando quedar embarazada si est amamantando a un beb Cmo debo utilizar este medicamento? Tome este frmaco por va oral con un vaso lleno de agua. selo segn las instrucciones en la etiqueta el mismo da todas las semanas. Tome la dosis de inmediato despus de despertarse. No coma ni beba nada antes de tomarla. No la tome con ninguna otra bebida excepto agua. No mastique ni triture la tableta. Despus de tomar este medicamento, no desayune, no beba nada ni tome ningn otro frmaco o vitamina durante al menos 30 minutos. Qudese sentado o parado durante al menos 30 minutos despus de tomar [employer]. No se acueste. Siga usndolo a menos que su proveedor de Insurance risk surveyor indique dejar de  [place]. Su farmacutico le dar una [place] del medicamento especial (MedGuide, nombre en ingls) con cada receta y en cada ocasin que la vuelva a surtir. Asegrese de leer esta informacin cada vez cuidadosamente. Hable con su proveedor de atencin [employer] uso de este frmaco en nios. Puede requerir atencin especial. Sobredosis: Pngase en contacto inmediatamente con un centro toxicolgico o una sala de urgencia si usted cree que haya tomado demasiado medicamento. ATENCIN: [employer] es solo para usted. No comparta este medicamento con nadie. Qu sucede si me olvido de una dosis? Si toma el frmaco una vez por da, [place] esa dosis. Tome su siguiente dosis en el horario programado la maana siguiente. No tome dos dosis el [employer]. Si toma el frmaco una vez por semana, tome la dosis que omiti la maana siguiente despus de acordarse. No tome dos dosis el [employer]. Qu puede interactuar con este medicamento? anticidos aspirina y frmacos tipo aspirina suplementos de calcio, especialmente calcio con vitamina D colestiramina o colestipol cimetidina, ranitidina u otros medicamentos usados para disminuir el cido estomacal corticosteroides suplementos de hierro suplementos de magnesio aceite mineral AINE, medicamentos para el dolor y la inflamacin, tales como ibuprofeno o naproxeno orlistat fenobarbital fenitona suplementos de fsforo primidona medicamentos esteroideos, tales como la prednisona o la cortisona diurticos tiazdicos vitaminas con minerales, especialmente la vitamina D Puede ser que esta lista no menciona todas las posibles interacciones. Informe a su profesional de [employer] de [employer] productos a base de hierbas, medicamentos de [place] o suplementos nutritivos que est tomando. Si usted fuma, consume bebidas alcohlicas o si utiliza drogas ilegales, indqueselo tambin a su profesional de [employer]. Algunas sustancias pueden interactuar con su  medicamento. A qu  debo estar atento al usar este medicamento? Visite a su proveedor de atencin mdica para que revise regularmente su evolucin. Es posible que pase un tiempo antes de que pueda notar los beneficios de este frmaco. Environmental manager personas que toman este frmaco tienen dolor intenso en los [place], las articulaciones o los msculos. Este frmaco tambin puede aumentar su riesgo de problemas en la [place] o de tener una fractura de fmur. Informe a su proveedor de Psychologist, prison and probation services de inmediato si tiene Psychologist, prison and probation services en la mandbula, los huesos, las articulaciones o los msculos. Informe a su proveedor de atencin mdica si tiene dolor que no desaparece o que empeora. Informe a su dentista y a su Airline pilot que est usando este frmaco. No deben realizarle una ciruga dental mayor mientras [place] este frmaco. Antes de comenzar a usar este frmaco, visite a su dentista para que Freight forwarder un examen y arregle cualquier problema dental. Cuide bien sus dientes mientras [place] este frmaco. Asegrese de visitar a su dentista para citas de seguimiento peridicas. Debe asegurarse de recibir suficiente calcio y vitamina D mientras toma este frmaco. Converse con su proveedor de atencin mdica sobre los alimentos que come y las vitaminas que toma. Usted podra necesitar realizarse [employer] de sangre mientras est usando este frmaco. Qu efectos secundarios puedo tener al utilizar este medicamento? Efectos secundarios que debe informar a su mdico o a su proveedor de atencin mdica tan pronto como sea posible: Therapist, art (erupcin cutnea, comezn/picazn o urticaria; hinchazn de la cara, los labios o la lengua) dolor de huesos acidez (sensacin de ardor en el pecho, generalmente despus de comer o al estar acostado) dolor de la mandbula, especialmente despus de tratamiento odontolgico dolor en las articulaciones niveles bajos de calcio (frecuencia cardiaca rpida; calambres musculares  o dolor muscular; dolor, hormigueo o entumecimiento en las manos o pies; convulsiones) dolor muscular dolor o dificultad al tragar enrojecimiento, formacin de ampollas, descamacin o distensin de la piel, incluso dentro de la boca sangrado estomacal (heces con sangre o de color negro y aspecto alquitranado; escupir sangre o Biomedical engineer que tiene el aspecto de posos [residuos] de caf) Efectos secundarios que generalmente no requieren atencin mdica (debe informarlos a su mdico o a Administrator de atencin mdica si persisten o si son molestos): estreimiento diarrea nuseas dolor estomacal Puede ser que esta lista no menciona todos los posibles efectos secundarios. Comunquese a su mdico por asesoramiento mdico [employer]. Usted puede informar los efectos secundarios a la FDA por telfono al 1-800-FDA-1088. Dnde debo guardar mi medicina? Mantenga fuera del alcance de nios y Neurosurgeon. Guarde a [employer], entre 15 y 30 grados Celsius (59 y 34 grados Fahrenheit). Proteja de la luz y la humedad. Mantenga este frmaco en el envase original hasta que est listo para usarlo. Deseche todo el frmaco que no haya utilizado despus de la fecha de vencimiento. ATENCIN: Este folleto es un resumen. Puede ser que no cubra toda la posible informacin. Si usted tiene preguntas acerca de esta medicina, consulte con su mdico, su farmacutico o su profesional de Radiographer, therapeutic.  2022 Elsevier/Gold Standard (2019-08-09 00:00:00)

## 2021-05-29 NOTE — Progress Notes (Signed)
[facility] [address] [place] [id number] Dept: [phone] Dept Fax: [phone]  Office Visit  Subjective:    Patient ID: Karen CarrowIdalia [name], female    DOB: [birth year], 81 y.o..   MRN: [id number]  Chief Complaint  Patient presents with   Acute Visit    C/o having chest discomfort/pain off/on x 1 week.      History of Present Illness:  Patient is in today for evaluation of two recent events of chest pain. The first occurred the day after starting on Fosmax. She had a midline discomfort that resolved as the day went on. A few days ago, she had a new area of pain in the left upper chest. She notes there was a tender lump in this area. Her son states this was oblong. This has since resolved. She had no associated dyspnea or diaphoresis. The pain was not worse with breathing.  Also, Ms. [name] notes she has several painful finger joints. She has tried running them under hot water.  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  Family History  Problem Relation Age of Onset   Hypertension Mother    Hyperlipidemia Mother    Hypertension Father    Outpatient Medications Prior to Visit  Medication Sig Dispense Refill   alendronate (FOSAMAX) 70 MG tablet Take 1 tablet (70 mg total) by mouth every 7 (seven) days. Take with a full glass of water on an empty stomach. 4 tablet 11   Cholecalciferol 50 MCG (2000 UT) TABS Take 2,000 Units by mouth daily.     ibuprofen (ADVIL) 800 MG tablet Take 1 tablet (800 mg total) by mouth every 8 (eight) hours as needed for moderate pain. 21 tablet 0    losartan-hydrochlorothiazide (HYZAAR) 100-25 MG tablet Take 1 tablet by mouth daily. 90 tablet 1   meclizine (ANTIVERT) 12.5 MG tablet Take 1 tablet (12.5 mg total) by mouth 3 (three) times daily as needed for dizziness. 60 tablet 0   tobramycin (TOBREX) 0.3 % ophthalmic solution Place 1 drop into the left eye every 4 (four) hours. 5 mL 0   vitamin C (ASCORBIC ACID) 250 MG tablet Take 250 mg by mouth daily.     No facility-administered medications prior to visit.   No Known Allergies    Objective:   Today's Vitals   05/29/21 0906  BP: 134/80  Pulse: 67  Temp: (!) 96.9 F (36.1 C)  TempSrc: Temporal  SpO2: 98%  Weight: 160 lb 3.2 oz (72.7 kg)  Height: 5\' 3"  (1.6 m)   Body mass index is 28.38 kg/m.   General: Well developed, well nourished. No acute distress. Chest: No tenderness to palpation and no lump present today. CV: RRR without murmurs or rubs. Pulses 2+ bilaterally. Extremities: The right 1st IP joint is mildly swollen and tender with passive flexion. The   joint is slightly boggy. There are some mild similar features to other DIP joints of the   hand, though not as tender. Psych: Alert and oriented. Normal mood and affect.  Health Maintenance Due  Topic Date Due   Zoster Vaccines- Shingrix (1 of 2) Never done   TETANUS/TDAP  05/23/2019  COVID-19 Vaccine (3 - Pfizer risk series) 09/21/2019   INFLUENZA VACCINE  Never done     Assessment & Plan:   1. Chest discomfort I suspect the initial chest pain may have been some esophagitis due to her alendronate. I reinforced the instructions to take this with at least 6 oz. of water and to remain upright for 30 min. afterwards. If she has recurrent symptoms with this weeks' dose, we would consider a switch to a liquid formulation.  The 2nd episode may have been a localized swollen muscle. As it is now resolved, we will follow this. The pain was not suggestive of a cardiac issue.  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 Discussed doing hot soaks with ROM and using an antiinflammatory for this.  - naproxen (NAPROSYN) 500 MG tablet; Take 1 tablet (500 mg total) by mouth 2 (two) times daily with a meal.  Dispense: 30 tablet; Refill: 0  [name], MD

## 2021-07-28 IMAGING — CT CT HEAD W/O CM
1 series · 12 of 34 positions shown, 15 images · non-contrast
Comparison: None.

CLINICAL DATA: Seizure versus syncope.

EXAM:
CT HEAD WITHOUT CONTRAST
TECHNIQUE: Contiguous axial images were obtained from the base of the skull
through the vertex without intravenous contrast.

[Series 5: coronal soft tissue · coronal · 0.28mm/px · 12 of 62 slices shown, 15 images]
[im 5/62  brain]
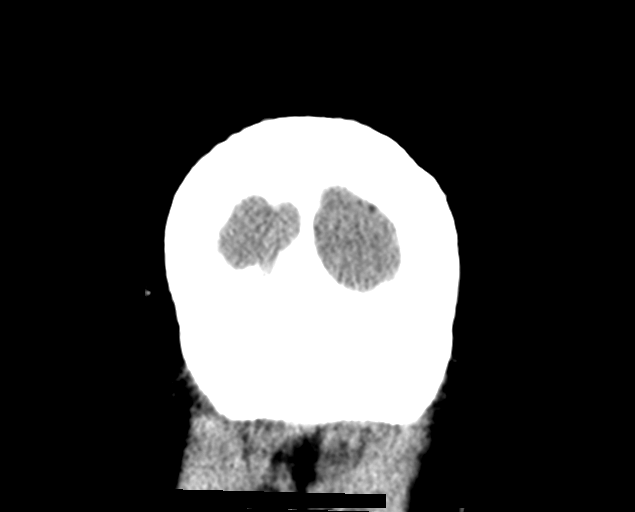
[im 5/62  bone]
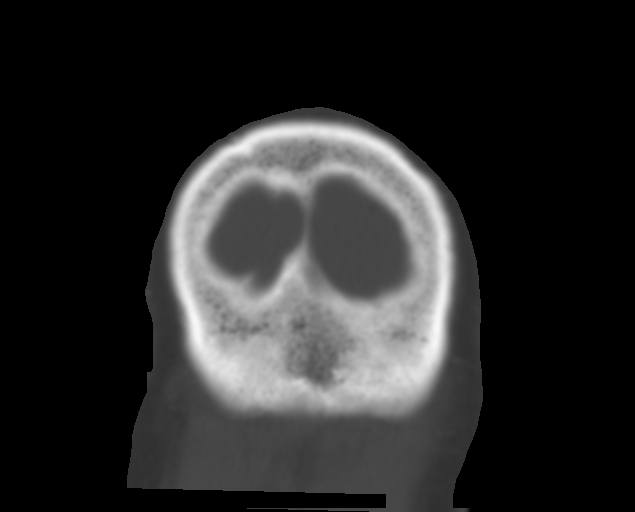
[im 11/62  brain]
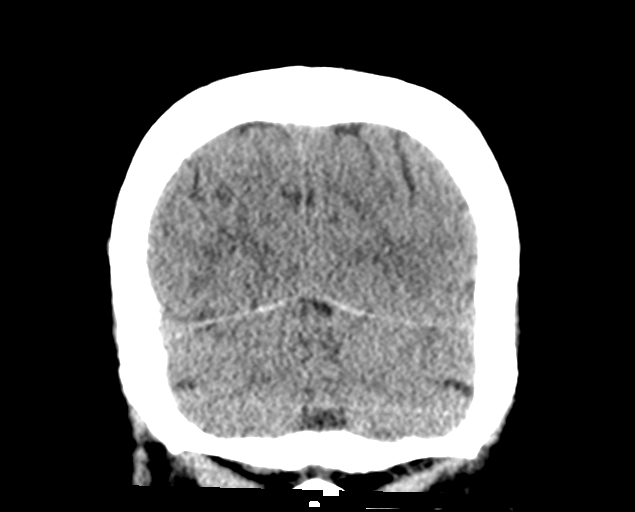
[im 14/62  brain]
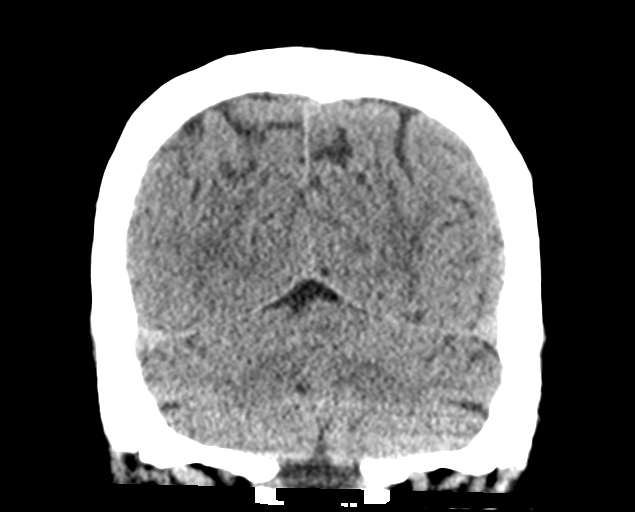
[im 19/62  brain]
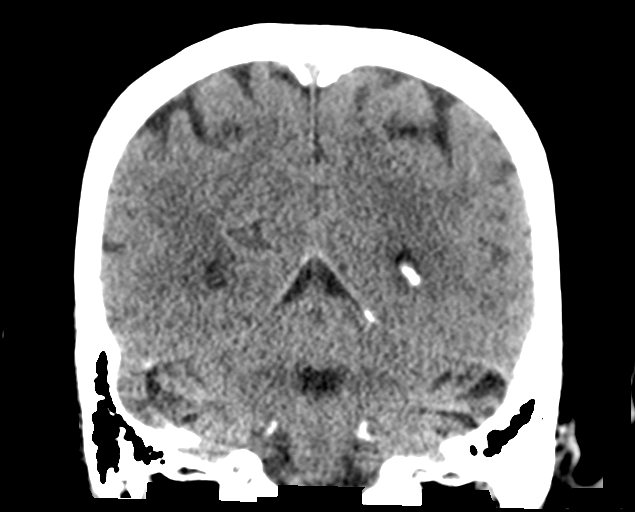
[im 24/62  brain]
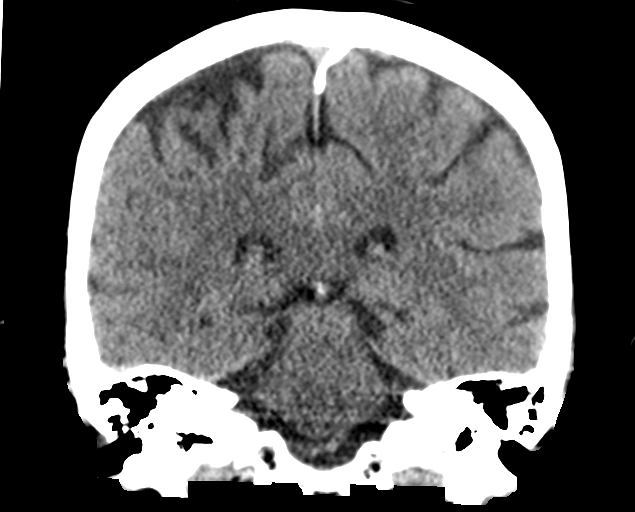
[im 24/62  bone]
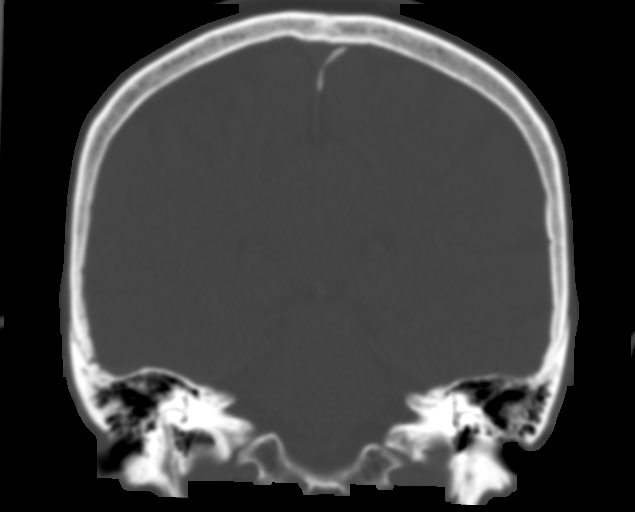
[im 28/62  brain]
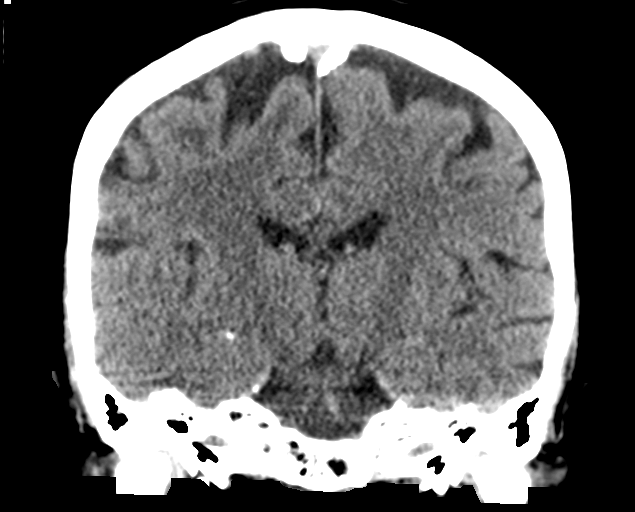
[im 34/62  brain]
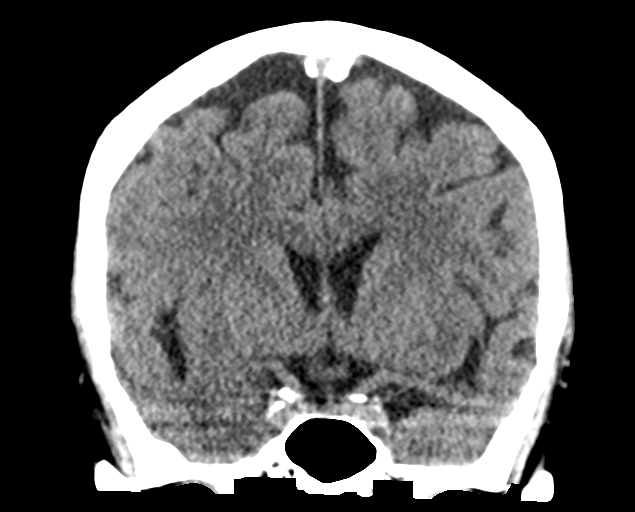
[im 40/62  brain]
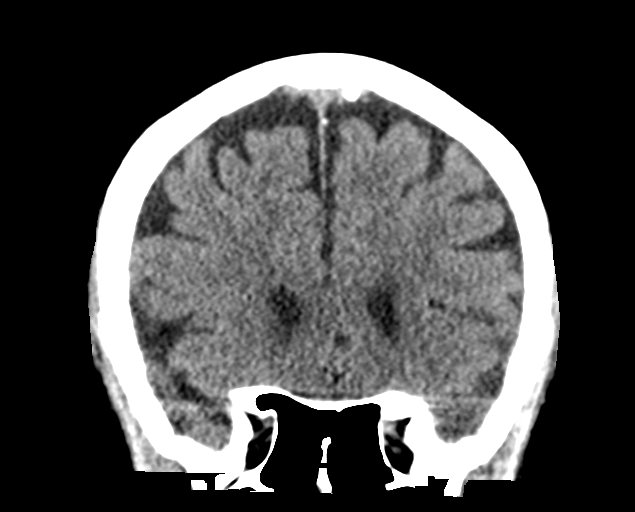
[im 43/62  brain]
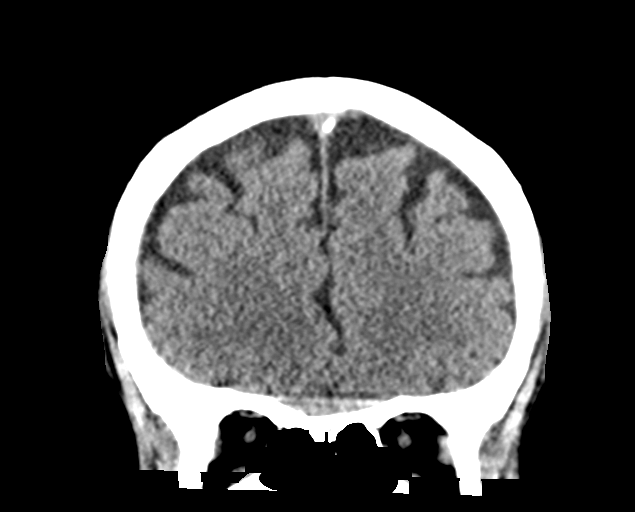
[im 43/62  bone]
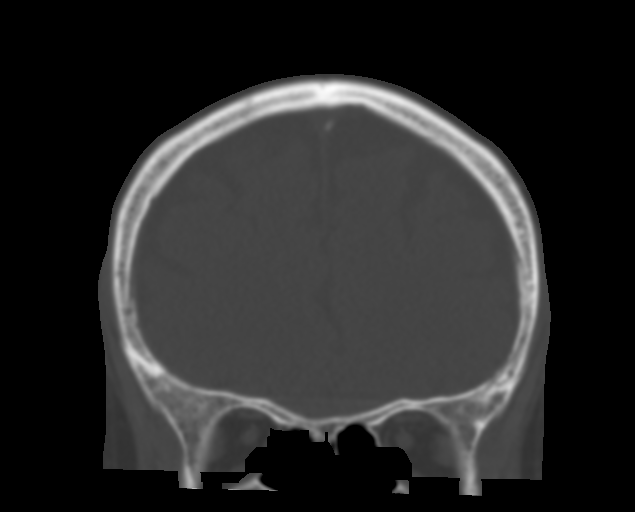
[im 48/62  brain]
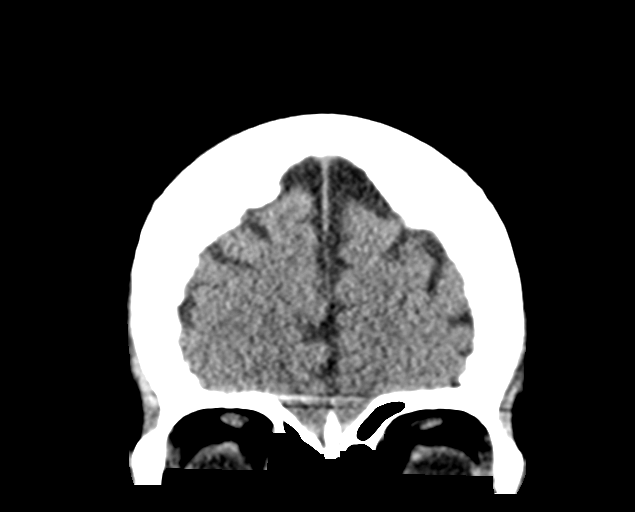
[im 53/62  brain]
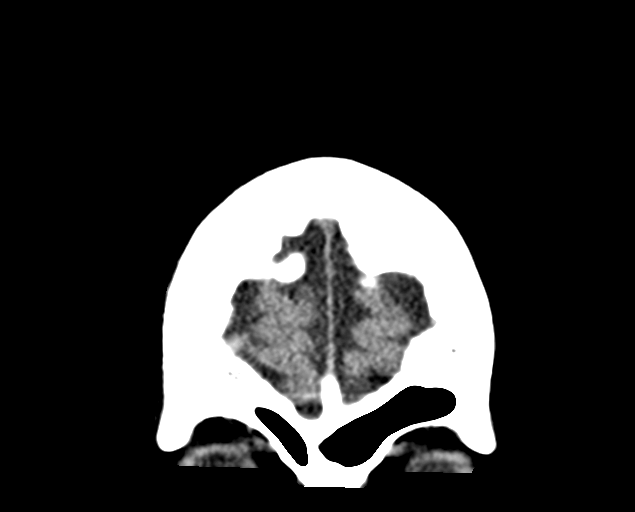
[im 57/62  brain]
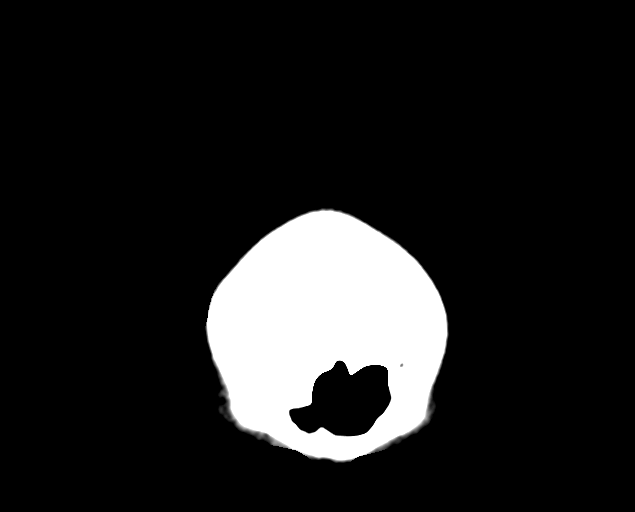

[12 of 34 positions shown; findings below may reference images not displayed]

FINDINGS: Brain: There is mild cerebral atrophy with widening of the
extra-axial spaces and ventricular dilatation.
There are areas of decreased attenuation within the white matter
tracts of the supratentorial brain, consistent with microvascular
disease changes.

Vascular: No hyperdense vessel or unexpected calcification.

Skull: Normal. Negative for fracture or focal lesion.

Sinuses/Orbits: No acute finding.

Other: None.
IMPRESSION: No acute intracranial pathology.

## 2021-08-13 DIAGNOSIS — I700 Atherosclerosis of aorta: Secondary | ICD-10-CM | POA: Insufficient documentation

## 2021-08-17 ENCOUNTER — Ambulatory Visit: Payer: 59 | Admitting: Family Medicine

## 2021-08-23 ENCOUNTER — Other Ambulatory Visit: Payer: Self-pay | Admitting: Family Medicine

## 2021-08-23 DIAGNOSIS — I10 Essential (primary) hypertension: Secondary | ICD-10-CM

## 2021-08-31 ENCOUNTER — Other Ambulatory Visit: Payer: Self-pay | Admitting: Internal Medicine

## 2021-08-31 DIAGNOSIS — K7689 Other specified diseases of liver: Secondary | ICD-10-CM

## 2021-09-01 ENCOUNTER — Other Ambulatory Visit: Payer: Self-pay | Admitting: Internal Medicine

## 2021-09-01 ENCOUNTER — Ambulatory Visit
Admission: RE | Admit: 2021-09-01 | Discharge: 2021-09-01 | Disposition: A | Discharge status: Home / Self Care | Payer: 59 | Source: Ambulatory Visit | Attending: Internal Medicine | Admitting: Internal Medicine

## 2021-09-01 DIAGNOSIS — K7689 Other specified diseases of liver: Secondary | ICD-10-CM

## 2021-09-02 ENCOUNTER — Other Ambulatory Visit: Payer: 59

## 2021-09-03 ENCOUNTER — Ambulatory Visit
Admission: RE | Admit: 2021-09-03 | Discharge: 2021-09-03 | Disposition: A | Discharge status: Home / Self Care | Payer: 59 | Source: Ambulatory Visit | Attending: Internal Medicine | Admitting: Internal Medicine

## 2021-09-03 DIAGNOSIS — K7689 Other specified diseases of liver: Secondary | ICD-10-CM

## 2021-09-10 ENCOUNTER — Other Ambulatory Visit: Payer: Self-pay | Admitting: Family Medicine

## 2021-09-10 DIAGNOSIS — K7689 Other specified diseases of liver: Secondary | ICD-10-CM

## 2021-09-17 ENCOUNTER — Other Ambulatory Visit: Payer: 59

## 2021-11-03 ENCOUNTER — Ambulatory Visit (INDEPENDENT_AMBULATORY_CARE_PROVIDER_SITE_OTHER): Payer: 59

## 2021-11-03 DIAGNOSIS — Z0000 Encounter for general adult medical examination without abnormal findings: Secondary | ICD-10-CM

## 2021-11-03 NOTE — Progress Notes (Signed)
Subjective:   Karen [name] is a 81 y.o. female who presents for Medicare Annual (Subsequent) preventive examination.   I connected with Loletha Carrow  today by telephone and verified that I am speaking with the correct person using two identifiers. Location patient: home Location provider: work Persons participating in the virtual visit: patient, provider.   I discussed the limitations, risks, security and privacy concerns of performing an evaluation and management service by telephone and the availability of in person appointments. I also discussed with the patient that there may be a patient responsible charge related to this service. The patient expressed understanding and verbally consented to this telephonic visit.    Interactive audio and video telecommunications were attempted between this provider and patient, however failed, due to patient having technical difficulties OR patient did not have access to video capability.  We continued and completed visit with audio only.    Review of Systems     Cardiac Risk Factors include: advanced age (>72men, >68 women)     Objective:    Today's Vitals   There is no height or weight on file to calculate BMI.     11/03/2021   12:47 PM 10/28/2020    1:00 PM 09/10/2019    2:11 PM 08/22/2019    2:17 PM 07/19/2016    2:38 PM  Advanced Directives  Does Patient Have a Medical Advance Directive? Yes No No No No  Type of Estate agent of [place];Living will      Copy of Healthcare Power of Attorney in Chart? No - copy requested      Would patient like information on creating a medical advance directive?  No - Patient declined No - Patient declined No - Patient declined     Current Medications (verified) Outpatient Encounter Medications as of 11/03/2021  Medication Sig   alendronate (FOSAMAX) 70 MG tablet Take 1 tablet (70 mg total) by mouth every 7 (seven) days. Take with a full glass of water on an empty stomach.    Cholecalciferol 50 MCG (2000 UT) TABS Take 2,000 Units by mouth daily.   ibuprofen (ADVIL) 800 MG tablet Take 1 tablet (800 mg total) by mouth every 8 (eight) hours as needed for moderate pain.   losartan-hydrochlorothiazide (HYZAAR) 100-25 MG tablet TAKE 1 TABLET BY MOUTH DAILY   meclizine (ANTIVERT) 12.5 MG tablet Take 1 tablet (12.5 mg total) by mouth 3 (three) times daily as needed for dizziness.   tobramycin (TOBREX) 0.3 % ophthalmic solution Place 1 drop into the left eye every 4 (four) hours.   vitamin C (ASCORBIC ACID) 250 MG tablet Take 250 mg by mouth daily.   naproxen (NAPROSYN) 500 MG tablet Take 1 tablet (500 mg total) by mouth 2 (two) times daily with a meal. (Patient not taking: Reported on 11/03/2021)   No facility-administered encounter medications on file as of 11/03/2021.    Allergies (verified) Patient has no known allergies.   History: Past Medical History:  Diagnosis Date   Hypertension    History reviewed. No pertinent surgical history. Family History  Problem Relation Age of Onset   Hypertension Mother    Hyperlipidemia Mother    Hypertension Father    Social History   Socioeconomic History   Marital status: Widowed    Spouse name: Not on file   Number of children: 6   Years of education: Not on file   Highest education level: Not on file  Occupational History   Not on file  Tobacco Use   Smoking status: Never   Smokeless tobacco: Never  Vaping Use   Vaping Use: Never used  Substance and Sexual Activity   Alcohol use: No   Drug use: No   Sexual activity: Not Currently    Birth control/protection: None  Other Topics Concern   Not on file  Social History Narrative   Not on file   Social Determinants of Health   Financial Resource Strain: Low Risk  (11/03/2021)   Overall Financial Resource Strain (CARDIA)    Difficulty of Paying Living Expenses: Not hard at all  Food Insecurity: No Food Insecurity (11/03/2021)   Hunger Vital Sign    Worried  About Running Out of Food in the Last Year: Never true    Ran Out of Food in the Last Year: Never true  Transportation Needs: No Transportation Needs (11/03/2021)   PRAPARE - Administrator, Civil Service (Medical): No    Lack of Transportation (Non-Medical): No  Physical Activity: Sufficiently Active (11/03/2021)   Exercise Vital Sign    Days of Exercise per Week: 5 days    Minutes of Exercise per Session: 30 min  Stress: No Stress Concern Present (11/03/2021)   [employer] of Occupational Health - Occupational Stress Questionnaire    Feeling of Stress : Not at all  Social Connections: Moderately Integrated (11/03/2021)   Social Connection and Isolation Panel [NHANES]    Frequency of Communication with Friends and Family: Three times a week    Frequency of Social Gatherings with Friends and Family: Three times a week    Attends Religious Services: More than 4 times per year    Active Member of Clubs or Organizations: Yes    Attends Banker Meetings: 1 to 4 times per year    Marital Status: Widowed    Tobacco Counseling Counseling given: Not Answered   Clinical Intake:  Pre-visit preparation completed: Yes  Pain : No/denies pain     Nutritional Risks: None Diabetes: No  How often do you need to have someone help you when you read instructions, pamphlets, or other written materials from your doctor or pharmacy?: 1 - Never What is the last grade level you completed in school?: High School  Diabetic?no   Interpreter Needed?: No  Information entered by :: L.Valyncia Wiens,LPN   Activities of Daily Living    11/03/2021   12:50 PM  In your present state of health, do you have any difficulty performing the following activities:  Hearing? 0  Vision? 0  Difficulty concentrating or making decisions? 0  Walking or climbing stairs? 0  Dressing or bathing? 0  Doing errands, shopping? 0  Preparing Food and eating ? N  Using the Toilet? N  In the past six  months, have you accidently leaked urine? N  Do you have problems with loss of bowel control? N  Managing your Medications? N  Managing your Finances? N  Housekeeping or managing your Housekeeping? N    Patient Care Team: Loyola Mast, MD as PCP - General (Family Medicine)  Indicate any recent Medical Services you may have received from other than [facility] providers in the past year (date may be approximate).     Assessment:   This is a routine wellness examination for [place].  Hearing/Vision screen Vision Screening - Comments:: Annual eye exams   Dietary issues and exercise activities discussed: Current Exercise Habits: Home exercise routine, Type of exercise: walking, Time (Minutes): 30, Frequency (Times/Week): 5, Weekly Exercise (  Minutes/Week): 150, Intensity: Mild, Exercise limited by: None identified   Goals Addressed   None    Depression Screen    11/03/2021   12:47 PM 11/03/2021   12:46 PM 10/28/2020   12:53 PM 04/08/2020   11:31 AM 08/22/2019    2:26 PM 12/06/2015    2:48 PM  PHQ 2/9 Scores  PHQ - 2 Score 0 0 0 0 0 0    Fall Risk    11/03/2021   12:47 PM 10/28/2020   12:52 PM 04/08/2020   11:31 AM 08/22/2019    2:21 PM 05/10/2019    3:12 PM  Fall Risk   Falls in the past year? 0 0 0 0 0  Number falls in past yr: 0 0  0   Injury with Fall? 0 0  0   Risk for fall due to : No Fall Risks      Follow up Falls evaluation completed;Education provided Falls evaluation completed;Falls prevention discussed  Education provided;Falls prevention discussed     FALL RISK PREVENTION PERTAINING TO THE HOME:  Any stairs in or around the home? No  If so, are there any without handrails? No  Home free of loose throw rugs in walkways, pet beds, electrical cords, etc? Yes  Adequate lighting in your home to reduce risk of falls? Yes   ASSISTIVE DEVICES UTILIZED TO PREVENT FALLS:  Life alert? No  Use of a cane, walker or w/c? No  Grab bars in the bathroom? Yes  Shower chair or  bench in shower? Yes  Elevated toilet seat or a handicapped toilet? No     Cognitive Function:    Normal cognitive status assessed by telephone conversation by this Nurse Health Advisor. No abnormalities found.      Immunizations Immunization History  Administered Date(s) Administered   PFIZER(Purple Top)SARS-COV-2 Vaccination 07/27/2019, 08/24/2019   Pneumococcal Conjugate-13 12/15/2015   Pneumococcal Polysaccharide-23 05/22/2009   Tdap 05/22/2009    TDAP status: Due, Education has been provided regarding the importance of this vaccine. Advised may receive this vaccine at local pharmacy or Health Dept. Aware to provide a copy of the vaccination record if obtained from local pharmacy or Health Dept. Verbalized acceptance and understanding.  Flu Vaccine status: Declined, Education has been provided regarding the importance of this vaccine but patient still declined. Advised may receive this vaccine at local pharmacy or Health Dept. Aware to provide a copy of the vaccination record if obtained from local pharmacy or Health Dept. Verbalized acceptance and understanding.  Pneumococcal vaccine status: Up to date  Covid-19 vaccine status: Completed vaccines  Qualifies for Shingles Vaccine? Yes   Zostavax completed No   Shingrix Completed?: No.    Education has been provided regarding the importance of this vaccine. Patient has been advised to call insurance company to determine out of pocket expense if they have not yet received this vaccine. Advised may also receive vaccine at local pharmacy or Health Dept. Verbalized acceptance and understanding.  Screening Tests Health Maintenance  Topic Date Due   Zoster Vaccines- Shingrix (1 of 2) Never done   TETANUS/TDAP  05/23/2019   COVID-19 Vaccine (3 - Pfizer risk series) 09/21/2019   INFLUENZA VACCINE  11/17/2021   Pneumonia Vaccine 47+ Years old  Completed   DEXA SCAN  Completed   HPV VACCINES  Aged Out    Health  Maintenance  Health Maintenance Due  Topic Date Due   Zoster Vaccines- Shingrix (1 of 2) Never done   TETANUS/TDAP  05/23/2019  COVID-19 Vaccine (3 - Pfizer risk series) 09/21/2019    Colorectal cancer screening: No longer required.   Mammogram status: No longer required due to age.  Bone Density status: Completed 05/12/2021. Results reflect: Bone density results: OSTEOPENIA. Repeat every 5 years.  Lung Cancer Screening: (Low Dose CT Chest recommended if Age 17-80 years, 30 pack-year currently smoking OR have quit w/in 15years.) does not qualify.   Lung Cancer Screening Referral: n/a  Additional Screening:  Hepatitis C Screening: does not qualify;   Vision Screening: Recommended annual ophthalmology exams for early detection of glaucoma and other disorders of the eye. Is the patient up to date with their annual eye exam?  Yes  Who is the provider or what is the name of the office in which the patient attends annual eye exams? Dr.Hecker  If pt is not established with a provider, would they like to be referred to a provider to establish care? No .   Dental Screening: Recommended annual dental exams for proper oral hygiene  Community Resource Referral / Chronic Care Management: CRR required this visit?  No   CCM required this visit?  No      Plan:     I have personally reviewed and noted the following in the patient's chart:   Medical and social history Use of alcohol, tobacco or illicit drugs  Current medications and supplements including opioid prescriptions.  Functional ability and status Nutritional status Physical activity Advanced directives List of other physicians Hospitalizations, surgeries, and ER visits in previous 12 months Vitals Screenings to include cognitive, depression, and falls Referrals and appointments  In addition, I have reviewed and discussed with patient certain preventive protocols, quality metrics, and best practice recommendations. A  written personalized care plan for preventive services as well as general preventive health recommendations were provided to patient.     March Rummage, LPN   5/57/3220   Nurse Notes: none

## 2021-11-03 NOTE — Patient Instructions (Signed)
Karen [name] , Thank you for taking time to come for your Medicare Wellness Visit. I appreciate your ongoing commitment to your health goals. Please review the following plan we discussed and let me know if I can assist you in the future.   Screening recommendations/referrals: Colonoscopy: no longer required Mammogram: no longer required  Bone Density: 05/12/2021 Recommended yearly ophthalmology/optometry visit for glaucoma screening and checkup Recommended yearly dental visit for hygiene and checkup  Vaccinations: Influenza vaccine: completed  Pneumococcal vaccine: completed  Tdap vaccine: due  Shingles vaccine: will consider     Advanced directives: yes   Conditions/risks identified: none   Next appointment: none    Preventive Care 65 Years and Older, Female Preventive care refers to lifestyle choices and visits with your health care provider that can promote health and wellness. What does preventive care include? A yearly physical exam. This is also called an annual well check. Dental exams once or twice a year. Routine eye exams. Ask your health care provider how often you should have your eyes checked. Personal lifestyle choices, including: Daily care of your teeth and gums. Regular physical activity. Eating a healthy diet. Avoiding tobacco and drug use. Limiting alcohol use. Practicing safe sex. Taking low-dose aspirin every day. Taking vitamin and mineral supplements as recommended by your health care provider. What happens during an annual well check? The services and screenings done by your health care provider during your annual well check will depend on your age, overall health, lifestyle risk factors, and family history of disease. Counseling  Your health care provider may ask you questions about your: Alcohol use. Tobacco use. Drug use. Emotional well-being. Home and relationship well-being. Sexual activity. Eating habits. History of falls. Memory and  ability to understand (cognition). Work and work Astronomer. Reproductive health. Screening  You may have the following tests or measurements: Height, weight, and BMI. Blood pressure. Lipid and cholesterol levels. These may be checked every 5 years, or more frequently if you are over 83 years old. Skin check. Lung cancer screening. You may have this screening every year starting at age 14 if you have a 30-pack-year history of smoking and currently smoke or have quit within the past 15 years. Fecal occult blood test (FOBT) of the stool. You may have this test every year starting at age 70. Flexible sigmoidoscopy or colonoscopy. You may have a sigmoidoscopy every 5 years or a colonoscopy every 10 years starting at age 29. Hepatitis C blood test. Hepatitis B blood test. Sexually transmitted disease (STD) testing. Diabetes screening. This is done by checking your blood sugar (glucose) after you have not eaten for a while (fasting). You may have this done every 1-3 years. Bone density scan. This is done to screen for osteoporosis. You may have this done starting at age 28. Mammogram. This may be done every 1-2 years. Talk to your health care provider about how often you should have regular mammograms. Talk with your health care provider about your test results, treatment options, and if necessary, the need for more tests. Vaccines  Your health care provider may recommend certain vaccines, such as: Influenza vaccine. This is recommended every year. Tetanus, diphtheria, and acellular pertussis (Tdap, Td) vaccine. You may need a Td booster every 10 years. Zoster vaccine. You may need this after age 82. Pneumococcal 13-valent conjugate (PCV13) vaccine. One dose is recommended after age 27. Pneumococcal polysaccharide (PPSV23) vaccine. One dose is recommended after age 87. Talk to your health care provider about which screenings and  vaccines you need and how often you need them. This information is  not intended to replace advice given to you by your health care provider. Make sure you discuss any questions you have with your health care provider. Document Released: 05/02/2015 Document Revised: 12/24/2015 Document Reviewed: 02/04/2015 Elsevier Interactive Patient Education  2017 [place] Prevention in the Home Falls can cause injuries. They can happen to people of all ages. There are many things you can do to make your home safe and to help prevent falls. What can I do on the outside of my home? Regularly fix the edges of walkways and driveways and fix any cracks. Remove anything that might make you trip as you walk through a door, such as a raised step or threshold. Trim any bushes or trees on the path to your home. Use bright outdoor lighting. Clear any walking paths of anything that might make someone trip, such as rocks or tools. Regularly check to see if handrails are loose or broken. Make sure that both sides of any steps have handrails. Any raised decks and porches should have guardrails on the edges. Have any leaves, snow, or ice cleared regularly. Use sand or salt on walking paths during winter. Clean up any spills in your garage right away. This includes oil or grease spills. What can I do in the bathroom? Use night lights. Install grab bars by the toilet and in the tub and shower. Do not use towel bars as grab bars. Use non-skid mats or decals in the tub or shower. If you need to sit down in the shower, use a plastic, non-slip stool. Keep the floor dry. Clean up any water that spills on the floor as soon as it happens. Remove soap buildup in the tub or shower regularly. Attach bath mats securely with double-sided non-slip rug tape. Do not have throw rugs and other things on the floor that can make you trip. What can I do in the bedroom? Use night lights. Make sure that you have a light by your bed that is easy to reach. Do not use any sheets or blankets that  are too big for your bed. They should not hang down onto the floor. Have a firm chair that has side arms. You can use this for support while you get dressed. Do not have throw rugs and other things on the floor that can make you trip. What can I do in the kitchen? Clean up any spills right away. Avoid walking on wet floors. Keep items that you use a lot in easy-to-reach places. If you need to reach something above you, use a strong step stool that has a grab bar. Keep electrical cords out of the way. Do not use floor polish or wax that makes floors slippery. If you must use wax, use non-skid floor wax. Do not have throw rugs and other things on the floor that can make you trip. What can I do with my stairs? Do not leave any items on the stairs. Make sure that there are handrails on both sides of the stairs and use them. Fix handrails that are broken or loose. Make sure that handrails are as long as the stairways. Check any carpeting to make sure that it is firmly attached to the stairs. Fix any carpet that is loose or worn. Avoid having throw rugs at the top or bottom of the stairs. If you do have throw rugs, attach them to the floor with carpet tape. Make  sure that you have a light switch at the top of the stairs and the bottom of the stairs. If you do not have them, ask someone to add them for you. What else can I do to help prevent falls? Wear shoes that: Do not have high heels. Have rubber bottoms. Are comfortable and fit you well. Are closed at the toe. Do not wear sandals. If you use a stepladder: Make sure that it is fully opened. Do not climb a closed stepladder. Make sure that both sides of the stepladder are locked into place. Ask someone to hold it for you, if possible. Clearly mark and make sure that you can see: Any grab bars or handrails. First and last steps. Where the edge of each step is. Use tools that help you move around (mobility aids) if they are needed. These  include: Canes. Walkers. Scooters. Crutches. Turn on the lights when you go into a dark area. Replace any light bulbs as soon as they burn out. Set up your furniture so you have a clear path. Avoid moving your furniture around. If any of your floors are uneven, fix them. If there are any pets around you, be aware of where they are. Review your medicines with your doctor. Some medicines can make you feel dizzy. This can increase your chance of falling. Ask your doctor what other things that you can do to help prevent falls. This information is not intended to replace advice given to you by your health care provider. Make sure you discuss any questions you have with your health care provider. Document Released: 01/30/2009 Document Revised: 09/11/2015 Document Reviewed: 05/10/2014 Elsevier Interactive Patient Education  2017 [employer].

## 2021-11-11 ENCOUNTER — Emergency Department (HOSPITAL_COMMUNITY)
Admission: EM | Admit: 2021-11-11 | Discharge: 2021-11-11 | Disposition: A | Discharge status: Home / Self Care | Payer: 59 | Attending: Emergency Medicine | Admitting: Emergency Medicine

## 2021-11-11 ENCOUNTER — Encounter (HOSPITAL_COMMUNITY): Payer: Self-pay

## 2021-11-11 DIAGNOSIS — R112 Nausea with vomiting, unspecified: Secondary | ICD-10-CM | POA: Diagnosis not present

## 2021-11-11 DIAGNOSIS — R55 Syncope and collapse: Secondary | ICD-10-CM | POA: Diagnosis present

## 2021-11-11 DIAGNOSIS — R61 Generalized hyperhidrosis: Secondary | ICD-10-CM | POA: Insufficient documentation

## 2021-11-11 LAB — BASIC METABOLIC PANEL
Anion gap: 9 (ref 5–15)
BUN: 22 mg/dL (ref 8–23)
CO2: 28 mmol/L (ref 22–32)
Calcium: 10.2 mg/dL (ref 8.9–10.3)
Chloride: 105 mmol/L (ref 98–111)
Creatinine, Ser: 1.27 mg/dL — ABNORMAL HIGH (ref 0.44–1.00)
GFR, Estimated: 42 mL/min — ABNORMAL LOW (ref >60)
Glucose, Bld: 114 mg/dL — ABNORMAL HIGH (ref 70–99)
Potassium: 4.2 mmol/L (ref 3.5–5.1)
Sodium: 142 mmol/L (ref 135–145)

## 2021-11-11 LAB — CBC
HCT: 38.9 % (ref 36.0–46.0)
Hemoglobin: 12.6 g/dL (ref 12.0–15.0)
MCH: 28 pg (ref 26.0–34.0)
MCHC: 32.4 g/dL (ref 30.0–36.0)
MCV: 86.4 fL (ref 80.0–100.0)
Platelets: 189 10*3/uL (ref 150–400)
RBC: 4.5 MIL/uL (ref 3.87–5.11)
RDW: 14.6 % (ref 11.5–15.5)
WBC: 10.5 10*3/uL (ref 4.0–10.5)
nRBC: 0 % (ref 0.0–0.2)

## 2021-11-11 LAB — CBG MONITORING, ED: Glucose-Capillary: 130 mg/dL — ABNORMAL HIGH (ref 70–99)

## 2021-11-11 MED ORDER — SODIUM CHLORIDE 0.9 % IV BOLUS
1000.0000 mL | Freq: Once | INTRAVENOUS | Status: AC
  Administered 2021-11-11: 1000 mL via INTRAVENOUS

## 2021-11-11 NOTE — ED Triage Notes (Signed)
Family states patient was outside walking with a friend when she suddenly threw up and passed out. Pt is cool, pale, and diaphoretic during triage. BG is 130. PT is alert and oriented x 4. Pt states she is weak all over and lightheaded. Pt denies cp or sob

## 2021-11-11 NOTE — ED Provider Triage Note (Signed)
Emergency Medicine Provider Triage Evaluation Note  Karen [name] , a 81 y.o. female  was evaluated in triage.  Pt complains of syncopal episode just prior to arrival.  Patient is present with family.  This was a witnessed episode.  Lasted a few seconds.  Patient has been out in the heat since 10 AM.  Patient became diaphoretic, and voided on herself.  Denies chest pain, shortness of breath, palpitations.  Review of Systems  Positive: As above Negative: As above  Physical Exam  BP 128/72 (BP Location: Left Arm)   Pulse 80   Temp 98 F (36.7 C) (Oral)   Resp 16   Ht 5\' 6"  (1.676 m)   Wt 72.6 kg   SpO2 92%   BMI 25.82 kg/m  Gen:   Awake, no distress   Resp:  Normal effort  MSK:   Moves extremities without difficulty Other:    Medical Decision Making  Medically screening exam initiated at 2:38 PM.  Appropriate orders placed.  Karen [name] was informed that the remainder of the evaluation will be completed by another provider, this initial triage assessment does not replace that evaluation, and the importance of remaining in the ED until their evaluation is complete.     Karen Carrow, PA-C 11/11/21 1439

## 2021-11-11 NOTE — ED Provider Notes (Signed)
[facility]-EMERGENCY DEPT Provider Note   CSN: [id number] Arrival date & time: 11/11/21  1406     History  Chief Complaint  Patient presents with   Loss of Consciousness    Karen [name] is a 81 y.o. female.  81 yo F with a chief complaints of a syncopal event.  She actually had 1 of these recently.  Was reportedly seen here.  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  She suddenly got nauseated and sweaty and pale had an episode of emesis and then lost consciousness.  Had another episode of emesis upon awakening.  She feels a bit better now but feels a bit weak.  Tells me that it was very hot outside.  Did not have lunch today.  She denies any chest pain denies difficulty breathing denies headache or neck pain.  Denies abdominal pain.   Loss of Consciousness      Home Medications Prior to Admission medications   Medication Sig Start Date End Date Taking? Authorizing Provider  alendronate (FOSAMAX) 70 MG tablet Take 1 tablet (70 mg total) by mouth every 7 (seven) days. Take with a full glass of water on an empty stomach. 05/19/21   Loyola Mast, MD  Cholecalciferol 50 MCG (2000 UT) TABS Take 2,000 Units by mouth daily. 09/05/18   [provider]  ibuprofen (ADVIL) 800 MG tablet Take 1 tablet (800 mg total) by mouth every 8 (eight) hours as needed for moderate pain. 06/13/19   Moshe Cipro, NP  losartan-hydrochlorothiazide [facility]) 100-25 MG tablet TAKE 1 TABLET BY MOUTH DAILY 08/24/21   Loyola Mast, MD  meclizine (ANTIVERT) 12.5 MG tablet Take 1 tablet (12.5 mg total) by mouth 3 (three) times daily as needed for dizziness. 09/18/20   Cirigliano, Jearld Lesch, DO  naproxen (NAPROSYN) 500 MG tablet Take 1 tablet (500 mg total) by mouth 2 (two) times daily with a meal. Patient not taking: Reported on 11/03/2021 05/29/21   Loyola Mast, MD  tobramycin (TOBREX) 0.3 % ophthalmic solution Place  1 drop into the left eye every 4 (four) hours. 03/14/21   Wallis Bamberg, PA-C  vitamin C (ASCORBIC ACID) 250 MG tablet Take 250 mg by mouth daily.    [provider]      Allergies    Patient has no known allergies.    Review of Systems   Review of Systems  Cardiovascular:  Positive for syncope.    Physical Exam Updated Vital Signs BP 140/69   Pulse 61   Temp 97.7 F (36.5 C) (Oral)   Resp 17   Ht 5\' 6"  (1.676 m)   Wt 72.6 kg   SpO2 98%   BMI 25.82 kg/m  Physical Exam Vitals and nursing note reviewed.  Constitutional:      General: She is not in acute distress.    Appearance: She is well-developed. She is not diaphoretic.  HENT:     Head: Normocephalic and atraumatic.  Eyes:     Pupils: Pupils are equal, round, and reactive to light.  Cardiovascular:     Rate and Rhythm: Normal rate and regular rhythm.     Heart sounds: No murmur heard.    No friction rub. No gallop.  Pulmonary:     Effort: Pulmonary effort is normal.     Breath sounds: No wheezing or rales.  Abdominal:     General: There is no distension.  Palpations: Abdomen is soft.     Tenderness: There is no abdominal tenderness.  Musculoskeletal:        General: No tenderness.     Cervical back: Normal range of motion and neck supple.  Skin:    General: Skin is warm and dry.  Neurological:     Mental Status: She is alert and oriented to person, place, and time.  Psychiatric:        Behavior: Behavior normal.     ED Results / Procedures / Treatments   Labs (all labs ordered are listed, but only abnormal results are displayed) Labs Reviewed  BASIC METABOLIC PANEL - Abnormal; Notable for the following components:      Result Value   Glucose, Bld 114 (*)    Creatinine, Ser 1.27 (*)    GFR, Estimated 42 (*)    All other components within normal limits  CBG MONITORING, ED - Abnormal; Notable for the following components:   Glucose-Capillary 130 (*)    All other components within normal  limits  CBC    EKG EKG Interpretation  Date/Time:  Wednesday November 11 2021 14:35:53 EDT Ventricular Rate:  75 PR Interval:  166 QRS Duration: 94 QT Interval:  393 QTC Calculation: 439 R Axis:   61 Text Interpretation: Sinus rhythm Abnormal R-wave progression, early transition no wpw, prolonged qt or brugada No significant change since last tracing Confirmed by Melene Plan [id number]) on 11/11/2021 5:27:42 PM  Radiology No results found.  Procedures .1-3 Lead EKG Interpretation  Performed by: Melene Plan, DO Authorized by: Melene Plan, DO     Interpretation: normal     ECG rate:  65   ECG rate assessment: normal     Rhythm: sinus rhythm     Ectopy: none     Conduction: normal       Medications Ordered in ED Medications  sodium chloride 0.9 % bolus 1,000 mL (1,000 mLs Intravenous New Bag/Given 11/11/21 1629)    ED Course/ Medical Decision Making/ A&P                           Medical Decision Making Amount and/or Complexity of Data Reviewed ECG/medicine tests: ordered.   81 yo F with a chief complaint of a syncopal event.  This occurred just prior to arrival.  It is 95 degrees outside and the patient was standing outside in the sun and was trying to preach to passerby's.  She also did not have lunch and then she syncopized.  She denies any chest pain difficulty breathing headache neck pain.  She feels quite a bit better now.  We will give a bolus of IV fluids.  Check blood work.  Reassess.  No significant electrolyte abnormality no significant anemia.3  EKG without ischemia or concerning signs.  No concerning finding on telemetry monitoring.  5:29 PM:  I have discussed the diagnosis/risks/treatment options with the patient.  Evaluation and diagnostic testing in the emergency department does not suggest an emergent condition requiring admission or immediate intervention beyond what has been performed at this time.  They will follow up with  PCP. We also discussed returning to  the ED immediately if new or worsening sx occur. We discussed the sx which are most concerning (e.g., sudden worsening pain, fever, inability to tolerate by mouth) that necessitate immediate return. Medications administered to the patient during their visit and any new prescriptions provided to the patient are listed below.  Medications given  during this visit Medications  sodium chloride 0.9 % bolus 1,000 mL (1,000 mLs Intravenous New Bag/Given 11/11/21 1629)     The patient appears reasonably screen and/or stabilized for discharge and I doubt any other medical condition or other [facility] requiring further screening, evaluation, or treatment in the ED at this time prior to discharge.          Final Clinical Impression(s) / ED Diagnoses Final diagnoses:  Syncope and collapse    Rx / DC Orders ED Discharge Orders     None         Melene Plan, DO 11/11/21 1729

## 2021-11-11 NOTE — Discharge Instructions (Signed)
Eat and drink as well as you can for the next 48 hours.  Please follow-up with your family doctor in the office.  As this is reoccurred on you they may want to refer you to cardiology and/or neurology for further work-up.  They may also consider putting you on something called a Holter monitor.  Please return for recurrent symptoms or if you develop headache neck pain chest pain shortness of breath or abdominal discomfort.  Coma y beba lo mejor que pueda durante las prximas 48 horas. Por favor, haga un seguimiento con su mdico de cabecera en el consultorio. A medida que esto vuelva a ocurrir en usted, es posible que deseen derivarlo a cardiologa y/o Software engineer para un estudio adicional. Tambin pueden considerar ponerlo en algo llamado monitor Holter. Regrese por sntomas recurrentes o si presenta dolor de cabeza, dolor de cuello, dolor de pecho, dificultad para respirar o malestar abdominal.

## 2021-11-16 ENCOUNTER — Other Ambulatory Visit: Payer: Self-pay | Admitting: Family Medicine

## 2021-11-16 DIAGNOSIS — I10 Essential (primary) hypertension: Secondary | ICD-10-CM

## 2021-12-25 ENCOUNTER — Other Ambulatory Visit: Payer: Self-pay | Admitting: Internal Medicine

## 2021-12-25 DIAGNOSIS — Z1231 Encounter for screening mammogram for malignant neoplasm of breast: Secondary | ICD-10-CM

## 2022-02-09 ENCOUNTER — Other Ambulatory Visit: Payer: Self-pay | Admitting: Family Medicine

## 2022-02-09 DIAGNOSIS — I10 Essential (primary) hypertension: Secondary | ICD-10-CM

## 2022-04-14 ENCOUNTER — Other Ambulatory Visit: Payer: Self-pay | Admitting: Family Medicine

## 2022-04-14 DIAGNOSIS — I10 Essential (primary) hypertension: Secondary | ICD-10-CM

## 2022-05-13 ENCOUNTER — Ambulatory Visit: Payer: 59

## 2022-05-14 ENCOUNTER — Encounter: Payer: Self-pay | Admitting: Family Medicine

## 2022-05-14 DIAGNOSIS — H353190 Nonexudative age-related macular degeneration, unspecified eye, stage unspecified: Secondary | ICD-10-CM | POA: Insufficient documentation

## 2022-05-14 DIAGNOSIS — H43811 Vitreous degeneration, right eye: Secondary | ICD-10-CM | POA: Insufficient documentation

## 2022-05-14 LAB — HM DIABETES EYE EXAM

## 2022-05-26 ENCOUNTER — Ambulatory Visit
Admission: RE | Admit: 2022-05-26 | Discharge: 2022-05-26 | Disposition: A | Discharge status: Home / Self Care | Payer: 59 | Source: Ambulatory Visit | Attending: Internal Medicine | Admitting: Internal Medicine

## 2022-05-26 DIAGNOSIS — Z1231 Encounter for screening mammogram for malignant neoplasm of breast: Secondary | ICD-10-CM

## 2022-09-05 ENCOUNTER — Ambulatory Visit
Admission: EM | Admit: 2022-09-05 | Discharge: 2022-09-05 | Disposition: A | Discharge status: Home / Self Care | Payer: 59 | Attending: Nurse Practitioner | Admitting: Nurse Practitioner

## 2022-09-05 DIAGNOSIS — M62838 Other muscle spasm: Secondary | ICD-10-CM

## 2022-09-05 MED ORDER — MELOXICAM 7.5 MG PO TABS: 7.5000 mg | ORAL_TABLET | Freq: Every day | ORAL | 0 refills | Status: AC

## 2022-09-05 MED ORDER — CYCLOBENZAPRINE HCL 5 MG PO TABS: 5.0000 mg | ORAL_TABLET | Freq: Two times a day (BID) | ORAL | 0 refills | Status: DC | PRN

## 2022-09-05 NOTE — Discharge Instructions (Signed)
Your symptoms are consistent with muscle spasm of your neck You may take Flexeril twice daily as needed.  Please note this medication can make you drowsy.  Do not drink alcohol or drive while on this medication Meloxicam once a day for 5 days.  Do not take any over-the-counter ibuprofen, Advil, Aleve, naproxen while you are on this medication.  You may take Tylenol as needed Heat to the neck as you needed Rest Please follow-up with your PCP if your symptoms do not improve Please go to the emergency room for any worsening symptoms

## 2022-09-05 NOTE — ED Provider Notes (Signed)
[facility]    CSN: [id number] Arrival date & time: 09/05/22  1208      History   Chief Complaint Chief Complaint  Patient presents with   Neck Pain    HPI Karen [name] is a 82 y.o. female presents for evaluation of neck pain.  Patient states around 2 AM she awoke with left-sided neck pain/muscle spasms that occur with movement.  Denies any known injury or inciting event.  Denies any headache, or back pain.  No numbness/tingling/weakness of her upper extremities.  Denies having a history of muscle spasms in the past.  She has not taken any OTC medications for her symptoms.  No other concerns at this time.   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 No pertinent surgical history.  OB History   No obstetric history on file.      Home Medications    Prior to Admission medications   Medication Sig Start Date End Date Taking? Authorizing Provider  cyclobenzaprine (FLEXERIL) 5 MG tablet Take 1 tablet (5 mg total) by mouth 2 (two) times daily as needed for muscle spasms. 09/05/22  Yes Radford Pax, NP  meloxicam (MOBIC) 7.5 MG tablet Take 1 tablet (7.5 mg total) by mouth daily for 5 days. 09/05/22 09/10/22 Yes Radford Pax, NP  alendronate (FOSAMAX) 70 MG tablet Take 1 tablet (70 mg total) by mouth every 7 (seven) days. Take with a full glass of water on an empty stomach. 05/19/21   Loyola Mast, MD  Cholecalciferol 50 MCG (2000 UT) TABS Take 2,000 Units by mouth daily. 09/05/18   [provider]  ibuprofen (ADVIL) 800 MG tablet Take 1 tablet (800 mg total) by mouth every 8 (eight) hours as needed for moderate pain. 06/13/19   Moshe Cipro, NP  losartan-hydrochlorothiazide [facility]) 100-25 MG tablet TAKE 1 TABLET BY MOUTH DAILY 02/10/22   Loyola Mast, MD  meclizine (ANTIVERT) 12.5 MG tablet Take 1 tablet (12.5 mg total) by mouth 3 (three) times daily as needed for dizziness. 09/18/20   Cirigliano, Jearld Lesch, DO  tobramycin (TOBREX) 0.3 % ophthalmic solution Place 1 drop into the left eye every 4 (four) hours. 03/14/21   Wallis Bamberg, PA-C  vitamin C (ASCORBIC ACID) 250 MG tablet Take 250 mg by mouth daily.    [provider]    Family History Family History  Problem Relation Age of Onset   Hypertension Mother    Hyperlipidemia Mother    Hypertension Father     Social History Social History   Tobacco Use   Smoking status: Never   Smokeless tobacco: Never  Vaping Use   Vaping Use: Never used  Substance Use Topics   Alcohol use: No   Drug use: No     Allergies   Patient has no known allergies.   Review of Systems Review of Systems  Musculoskeletal:  Positive for neck pain.     Physical Exam Triage Vital Signs ED Triage Vitals  Enc Vitals Group  BP 09/05/22 1233 (!) 162/87     Pulse Rate 09/05/22 1233 74     Resp 09/05/22 1233 18     Temp 09/05/22 1233 98 F (36.7 C)     Temp Source 09/05/22 1233 Oral     SpO2 09/05/22 1233 95 %     Weight --      Height --      Head Circumference --      Peak Flow --      Pain Score 09/05/22 1236 9     Pain Loc --      Pain Edu? --      Excl. in GC? --    No data found.  Updated Vital Signs BP (!) 162/87 (BP Location: Left Arm)   Pulse 74   Temp 98 F (36.7 C) (Oral)   Resp 18   SpO2 95%   Visual Acuity Right Eye Distance:   Left Eye Distance:   Bilateral Distance:    Right Eye Near:   Left Eye Near:    Bilateral Near:     Physical Exam Vitals and nursing note reviewed.   Constitutional:      General: She is not in acute distress.    Appearance: Normal appearance. She is not ill-appearing, toxic-appearing or diaphoretic.  HENT:     Head: Normocephalic and atraumatic.  Eyes:     Pupils: Pupils are equal, round, and reactive to light.  Neck:      Comments: Tender to palpation to left-sided paracervical spinal muscles that extends to left trapezius.  Full range of motion of neck with pain on active flexion and left rotation.  Strength is 5 out of 5 bilateral upper extremities Cardiovascular:     Rate and Rhythm: Normal rate.  Pulmonary:     Effort: Pulmonary effort is normal.  Musculoskeletal:     Cervical back: Normal range of motion and neck supple. No edema, erythema, signs of trauma, rigidity, torticollis or crepitus. Pain with movement and muscular tenderness present. No spinous process tenderness. Normal range of motion.  Skin:    General: Skin is warm and dry.  Neurological:     General: No focal deficit present.     Mental Status: She is alert and oriented to person, place, and time.  Psychiatric:        Mood and Affect: Mood normal.        Behavior: Behavior normal.      UC Treatments / Results  Labs (all labs ordered are listed, but only abnormal results are displayed) Labs Reviewed - No data to display  Basic metabolic panel Order: [id number] Status: Final result     Visible to patient: Yes (not seen)     Next appt: None   0 Result Notes          Component Ref Range & Units 9 mo ago (11/11/21) 1 yr ago (09/10/20) 2 yr ago (09/10/19) 6 yr ago (07/19/16) 6 yr ago (07/19/16) 14 yr ago (10/26/07) 15 yr ago (02/24/07)  Sodium 135 - 145 mmol/L 142 139 R 141 139 139 141 R 146 High  R  Potassium 3.5 - 5.1 mmol/L 4.2 3.8 R 3.5 3.4 Low  3.3 Low  4.1 R 4.8 R  Chloride 98 - 111 mmol/L 105 102 R 102 98 Low  R 99 Low  R 105 R 108 R  CO2 22 - 32 mmol/L 28 30 R 29  32 25 R 22 R  Glucose, Bld 70 - 99  mg/dL 119 High  89 147 High  CM 95 R 95 R  83 75  Comment: Glucose reference range applies only to samples taken after fasting for at least 8 hours.  BUN 8 - 23 mg/dL 22 18 R 20 19 R 16 R 20 R 16 R  Creatinine, Ser 0.44 - 1.00 mg/dL 8.29 High  5.62 R 1.30 1.20 High  1.06 High  0.73 R 0.76 R  Calcium 8.9 - 10.3 mg/dL 86.5 9.6 R 9.3  9.1 9.1 R 8.8 R  GFR, Estimated >60 mL/min 42 Low         Comment: (NOTE) Calculated using the CKD-EPI Creatinine Equation (2021)  Anion gap 5 - 15 9  10  CM  8    Comment: Performed at [facility], [address], [place], [place] [address]  Resulting Agency [facility] [place] [facility]          EKG   Radiology No results found.  Procedures Procedures (including critical care time)  Medications Ordered in UC Medications - No data to display  Initial Impression / Assessment and Plan / UC Course  I have reviewed the triage vital signs and the nursing notes.  Pertinent labs & imaging results that were available during my care of the patient were reviewed by me and considered in my medical decision making (see chart for details).     Reviewed exam and symptoms with patient.  Discussed exam consistent with muscle spasm Trial of Flexeril.  Side effect profile reviewed Mobic daily for 5 days.  Recent labs reviewed Heat to the neck as needed Rest PCP follow-up if symptoms do not improve ER precautions reviewed and patient verbalized understanding Final Clinical Impressions(s) / UC Diagnoses   Final diagnoses:  Muscle spasms of neck     Discharge Instructions      Your symptoms are consistent with muscle spasm of your neck You may take Flexeril twice daily as needed.  Please note this medication can make you drowsy.  Do not drink alcohol or drive while on this medication Meloxicam once a day for 5 days.  Do not take any over-the-counter ibuprofen, Advil, Aleve, naproxen while you are on this medication.  You  may take Tylenol as needed Heat to the neck as you needed Rest Please follow-up with your PCP if your symptoms do not improve Please go to the emergency room for any worsening symptoms   ED Prescriptions     Medication Sig Dispense Auth. Provider   cyclobenzaprine (FLEXERIL) 5 MG tablet Take 1 tablet (5 mg total) by mouth 2 (two) times daily as needed for muscle spasms. 10 tablet Radford Pax, NP   meloxicam (MOBIC) 7.5 MG tablet Take 1 tablet (7.5 mg total) by mouth daily for 5 days. 5 tablet Radford Pax, NP      PDMP not reviewed this encounter.   Radford Pax, NP 09/05/22 1302

## 2022-09-05 NOTE — ED Triage Notes (Signed)
Pt presents to UC w/ c/o left sided neck pain since 2am this morning.

## 2022-09-13 ENCOUNTER — Other Ambulatory Visit: Payer: Self-pay

## 2022-09-13 ENCOUNTER — Encounter (HOSPITAL_COMMUNITY): Payer: Self-pay

## 2022-09-13 ENCOUNTER — Emergency Department (HOSPITAL_COMMUNITY)
Admission: EM | Admit: 2022-09-13 | Discharge: 2022-09-13 | Disposition: A | Discharge status: Home / Self Care | Payer: 59 | Attending: Emergency Medicine | Admitting: Emergency Medicine

## 2022-09-13 DIAGNOSIS — B029 Zoster without complications: Secondary | ICD-10-CM | POA: Diagnosis not present

## 2022-09-13 DIAGNOSIS — R21 Rash and other nonspecific skin eruption: Secondary | ICD-10-CM | POA: Diagnosis present

## 2022-09-13 MED ORDER — VALACYCLOVIR HCL 1 G PO TABS: 1000.0000 mg | ORAL_TABLET | Freq: Three times a day (TID) | ORAL | 0 refills | Status: AC

## 2022-09-13 MED ORDER — PREDNISONE 10 MG PO TABS: 20.0000 mg | ORAL_TABLET | Freq: Every day | ORAL | 0 refills | Status: DC

## 2022-09-13 NOTE — ED Triage Notes (Signed)
Per EMS  Rash Started last week  Went to UC and dx with reaction to medication Stopped medication yesterday Rash is painful Burning Itching  BP 192/98 (took BP med about 1 hour ago) He 94 CBG 101

## 2022-09-13 NOTE — ED Provider Notes (Signed)
[place] EMERGENCY DEPARTMENT AT [facility] Provider Note   CSN: [id number] Arrival date & time: 09/13/22  1306     History  Chief Complaint  Patient presents with   Rash    Christean Rezek is a 82 y.o. female presented for rash that began last week.  Patient went to urgent care and was diagnosed with muscle strain and given Flexeril.  Patient began noticing rash on left upper back and left side of her neck and went back to the urgent care and given tizanidine and endorses continuation of rash.  Patient states that the rash is burning and itching and has tried Kenalog cream to no relief.  Patient states she has been able to eat and drink without issue and denies any fevers.  Patient denies any lesions in her mouth.  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  Patient was been referred to cosmetic dermatology for extraction of comedones.  Patient denies new lotions, soaps, clothes, detergent, foods, recent travel, new animals, shortness of breath, wheezing, new onset weakness, change sensation/motor skills, neck pain.    Home Medications Prior to Admission medications   Medication Sig Start Date End Date Taking? Authorizing Provider  predniSONE (DELTASONE) 10 MG tablet Take 2 tablets (20 mg total) by mouth daily. 09/13/22  Yes Milt Coye, Beverly Gust, PA-C  valACYclovir (VALTREX) 1000 MG tablet Take 1 tablet (1,000 mg total) by mouth 3 (three) times daily for 7 days. 09/13/22 09/20/22 Yes Jaki Steptoe, Beverly Gust, PA-C  alendronate (FOSAMAX) 70 MG tablet Take 1 tablet (70 mg total) by mouth every 7 (seven) days. Take with a full glass of water on an empty stomach. 05/19/21   Loyola Mast, MD  Cholecalciferol 50 MCG (2000 UT) TABS Take 2,000 Units by mouth daily. 09/05/18   [provider]  cyclobenzaprine (FLEXERIL) 5 MG tablet Take 1 tablet (5 mg total) by mouth 2 (two) times daily as needed for muscle spasms. 09/05/22    Radford Pax, NP  ibuprofen (ADVIL) 800 MG tablet Take 1 tablet (800 mg total) by mouth every 8 (eight) hours as needed for moderate pain. 06/13/19   Moshe Cipro, NP  losartan-hydrochlorothiazide [facility]) 100-25 MG tablet TAKE 1 TABLET BY MOUTH DAILY 02/10/22   Loyola Mast, MD  meclizine (ANTIVERT) 12.5 MG tablet Take 1 tablet (12.5 mg total) by mouth 3 (three) times daily as needed for dizziness. 09/18/20   Cirigliano, Jearld Lesch, DO  tobramycin (TOBREX) 0.3 % ophthalmic solution Place 1 drop into the left eye every 4 (four) hours. 03/14/21   Wallis Bamberg, PA-C  vitamin C (ASCORBIC ACID) 250 MG tablet Take 250 mg by mouth daily.    [provider]      Allergies    Patient has no known allergies.    Review of Systems   Review of Systems  Skin:  Positive for rash.    Physical Exam Updated Vital Signs BP (!) 189/94 (BP Location: Right Arm)   Pulse 75   Temp 98.1 F (36.7 C) (Oral)   Resp 16   Ht 5\' 3"  (1.6 m)   Wt 72.6 kg   SpO2 96%   BMI 28.34 kg/m  Physical Exam Skin:    Comments: Vesicular nature with erythematous base, nontender to palpation No mucosal lesions        ED Results / Procedures / Treatments   Labs (all labs ordered are listed, but only abnormal results  are displayed) Labs Reviewed - No data to display  EKG None  Radiology No results found.  Procedures Procedures    Medications Ordered in ED Medications - No data to display  ED Course/ Medical Decision Making/ A&P                             Medical Decision Making  Taralynn Cobbins 82 y.o. presented today for rash. Working DDx that I considered at this time includes, but not limited to, zoster, closed follicle comedones, SJS/TN, dress syndrome, contact dermatitis, urticaria.  R/o DDx: closed follicle comedones, SJS/TN, dress syndrome, contact dermatitis, urticaria: These are considered less likely due to history of present illness and physical exam findings  Review of  prior external notes: 05/31/2019 dermatology  Unique Tests and My Interpretation: None  Discussion with Independent Historian: None  Discussion of Management of Tests: None  Risk: Medium: prescription drug management  Risk Stratification Score: None  Staffed with Freida Busman, MD  Plan: Patient presented for rash. On exam patient was no acute distress and stable vitals.  Patient blood pressure was elevated however she did not take her blood pressure medications today.  Patient's exam showed that she had vesicular rash on left upper back and left side of her neck with erythematous base.  Patient describes rash as burning.  Rash appears to be zoster as noted along dermatome.  Patient will be given prednisone and Valtrex with primary care follow-up.  Patient was given return precautions. Patient stable for discharge at this time.  Patient verbalized understanding of plan.         Final Clinical Impression(s) / ED Diagnoses Final diagnoses:  Herpes zoster without complication    Rx / DC Orders ED Discharge Orders          Ordered    predniSONE (DELTASONE) 10 MG tablet  Daily        09/13/22 1358    valACYclovir (VALTREX) 1000 MG tablet  3 times daily        09/13/22 1358              Remi Deter 09/13/22 1414    Lorre Nick, MD 09/15/22 0900

## 2022-09-13 NOTE — Discharge Instructions (Signed)
Please follow-up with your primary care provider regarding recent symptoms and ER visit.  Today your rash appears to be shingles and will be prescribed prednisone along with antiviral called Valtrex to take.  Please return to ER if symptoms worsen.

## 2022-09-16 DIAGNOSIS — B029 Zoster without complications: Secondary | ICD-10-CM

## 2022-09-16 HISTORY — DX: Zoster without complications: B02.9

## 2023-03-30 IMAGING — MG MM DIGITAL SCREENING BILAT W/ TOMO AND CAD
8 of 14 series · 8 of 40 positions shown · non-contrast
Comparison: Previous exam(s).

CLINICAL DATA: Screening.

EXAM:
DIGITAL SCREENING BILATERAL MAMMOGRAM WITH TOMOSYNTHESIS AND CAD
TECHNIQUE: Bilateral screening digital craniocaudal and mediolateral oblique
mammograms were obtained. Bilateral screening digital breast
tomosynthesis was performed. The images were evaluated with
computer-aided detection.

[R MLO synth-2D (1 of 2)]
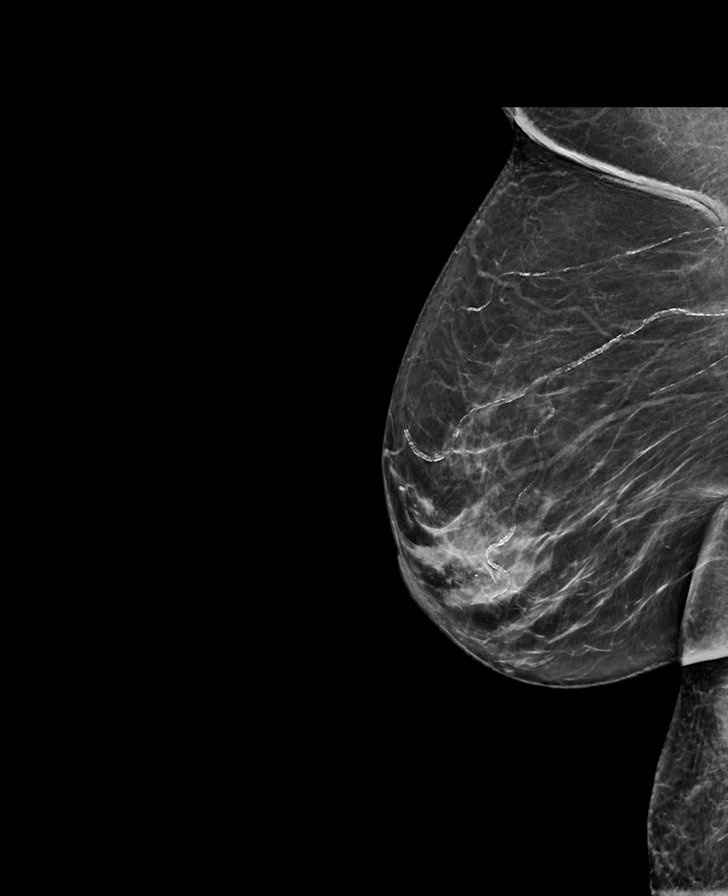

[L CC synth-2D (1 of 2)]
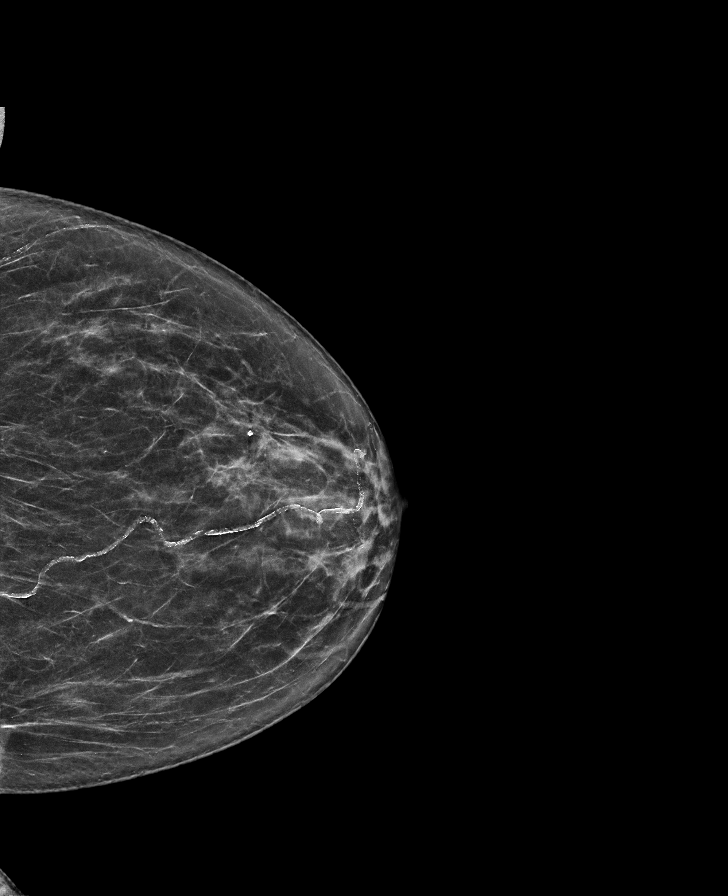

[L MLO synth-2D (1 of 2)]
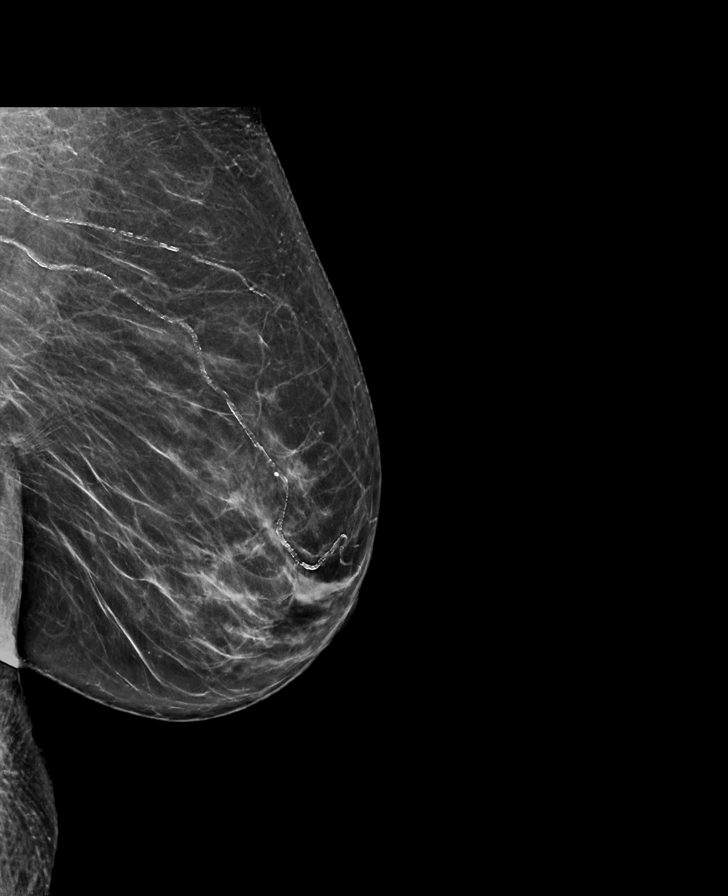

[L MLO synth-2D (2 of 2)]
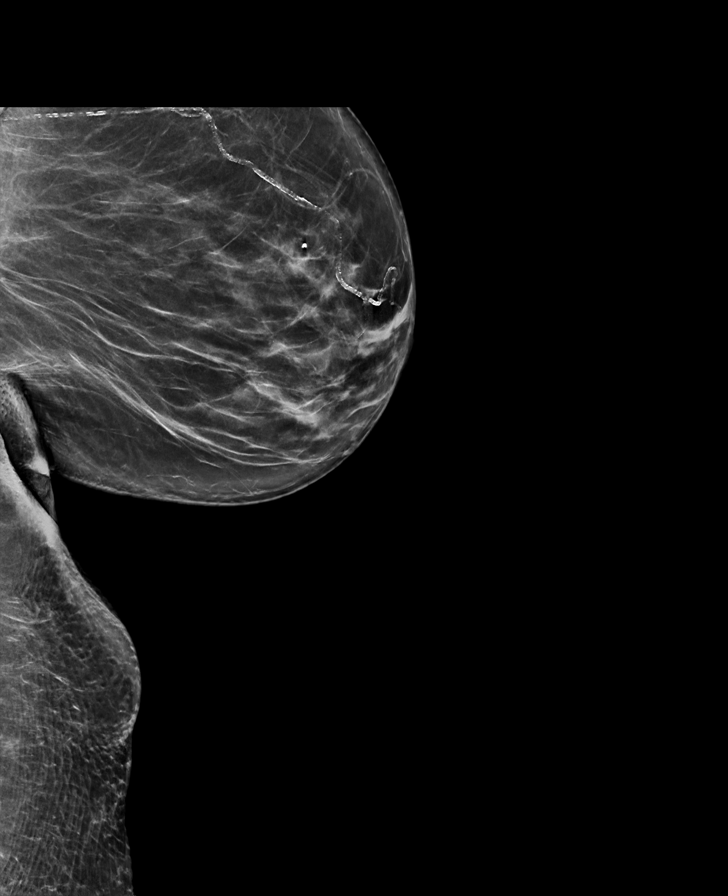

[R CC synth-2D]
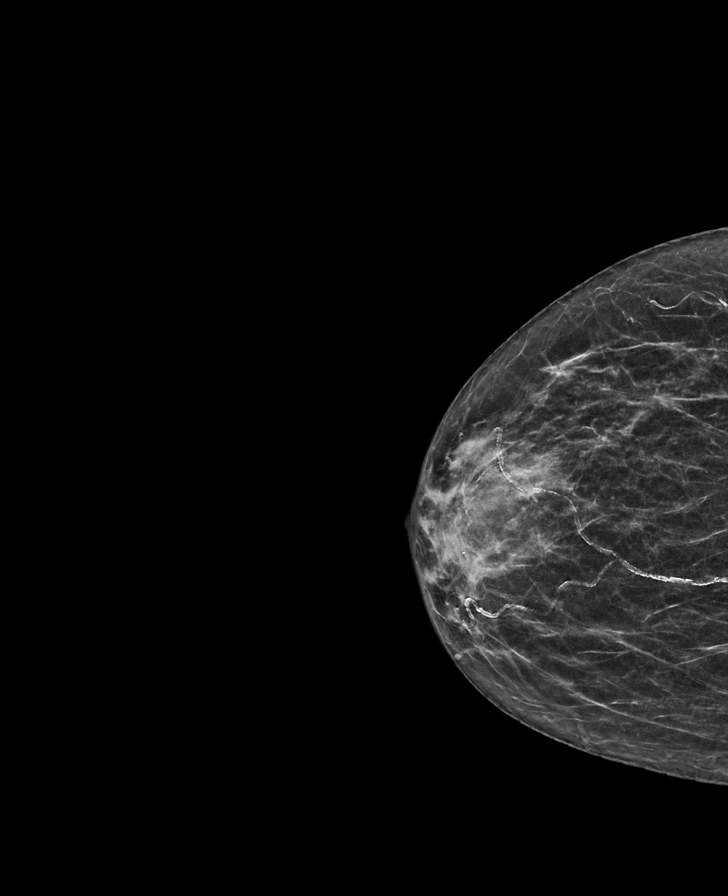

[L CC synth-2D (2 of 2)]
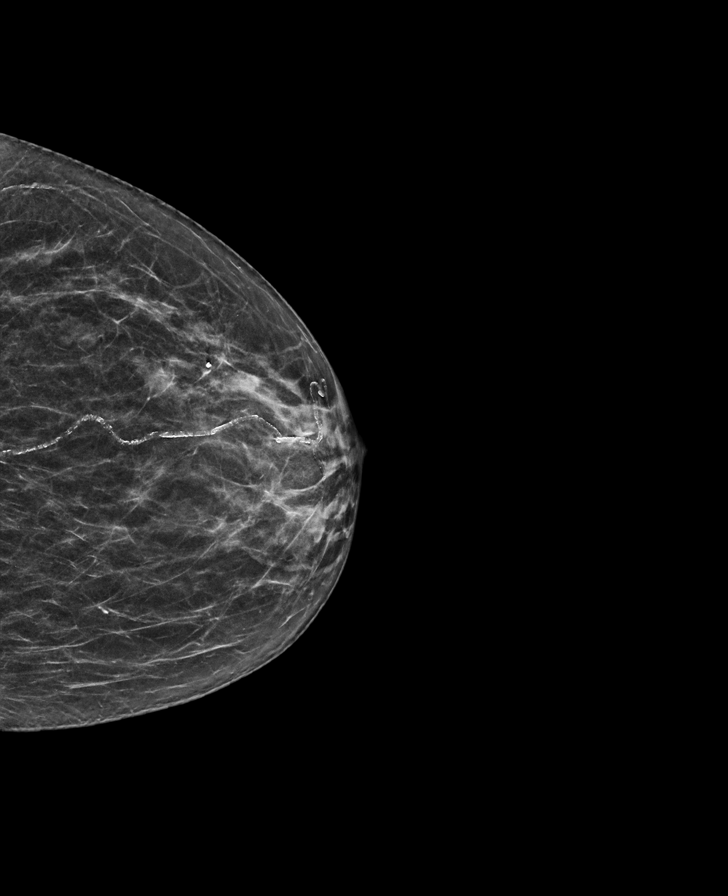

[R MLO synth-2D (2 of 2)]
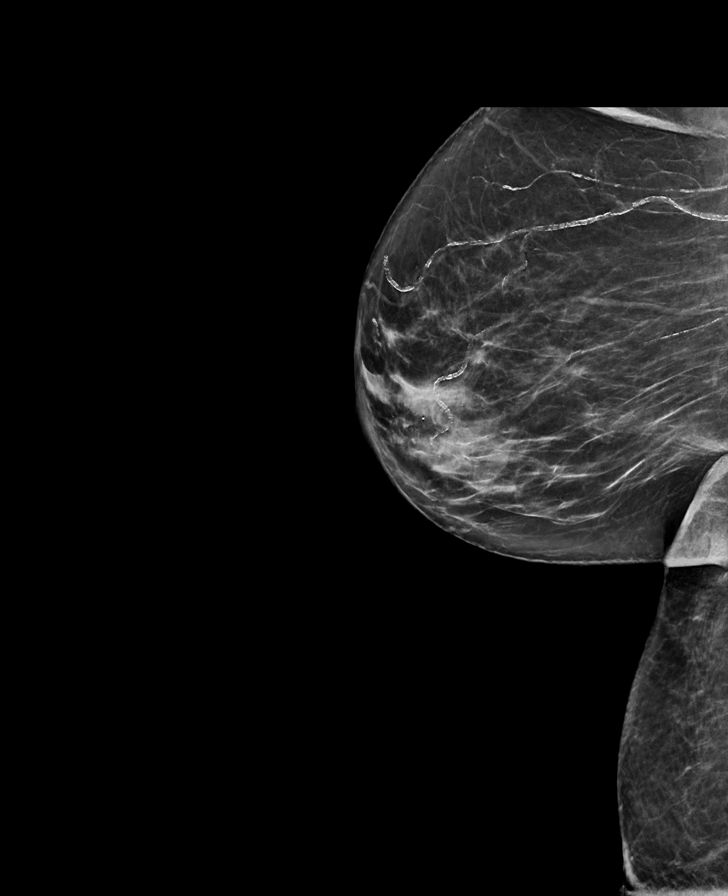

[L MLO tomo · tomo slice 37/73.0]
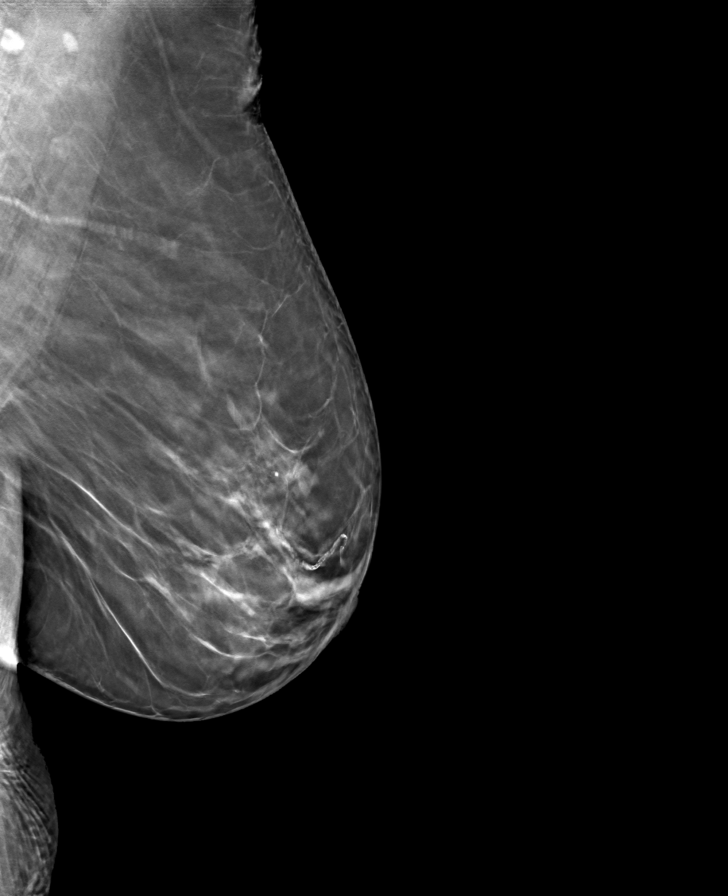

[8 of 40 positions shown; findings below may reference images not displayed]

ACR Breast Density Category c: The breast tissue is heterogeneously
dense, which may obscure small masses.
FINDINGS: There are no findings suspicious for malignancy.
IMPRESSION: No mammographic evidence of malignancy. A result letter of this
screening mammogram will be mailed directly to the patient.

RECOMMENDATION:
Screening mammogram in one year. (Code:Q3-W-BC3)

BI-RADS CATEGORY  1: Negative.

## 2023-07-22 IMAGING — US US ABDOMEN COMPLETE
1 series · 14 of 25 positions shown · non-contrast
Comparison: None Available.

CLINICAL DATA: Hepatic cyst.

EXAM:
ABDOMEN ULTRASOUND COMPLETE

[Series 1: us abdomen complete · 0.14mm/px · 14 of 108 slices shown]
[im 1/108]
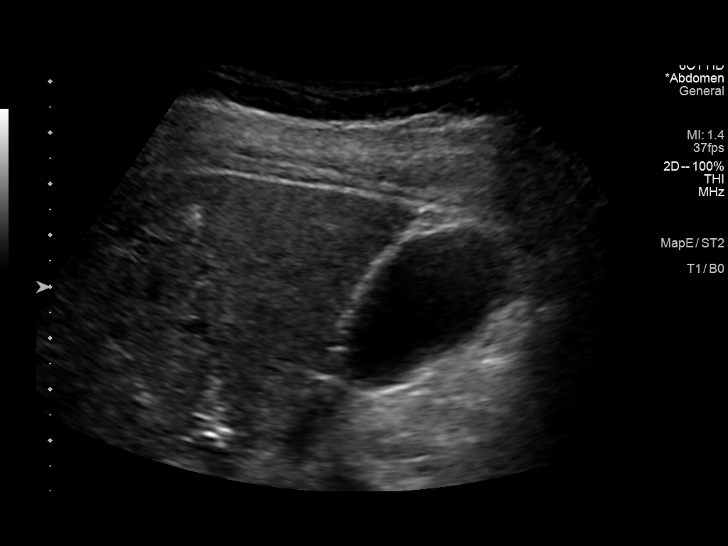
[im 9/108]
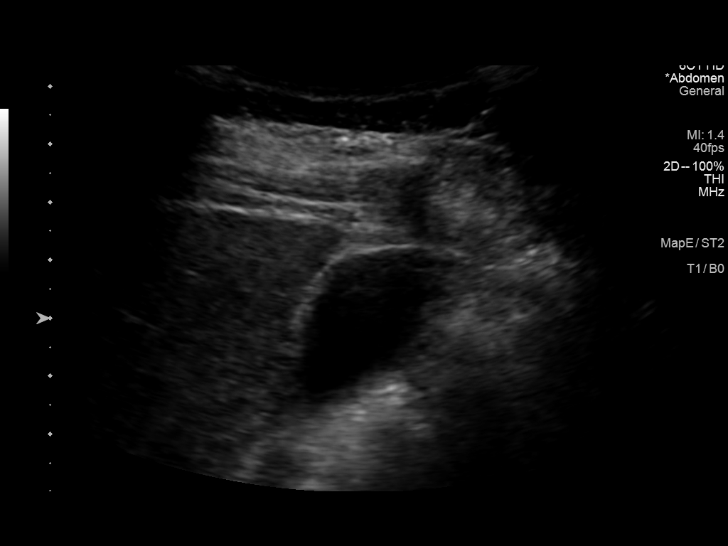
[im 18/108]
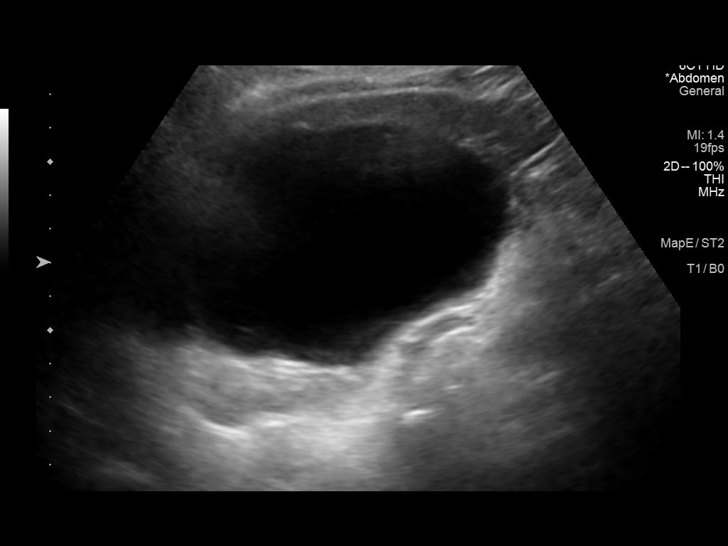
[im 27/108]
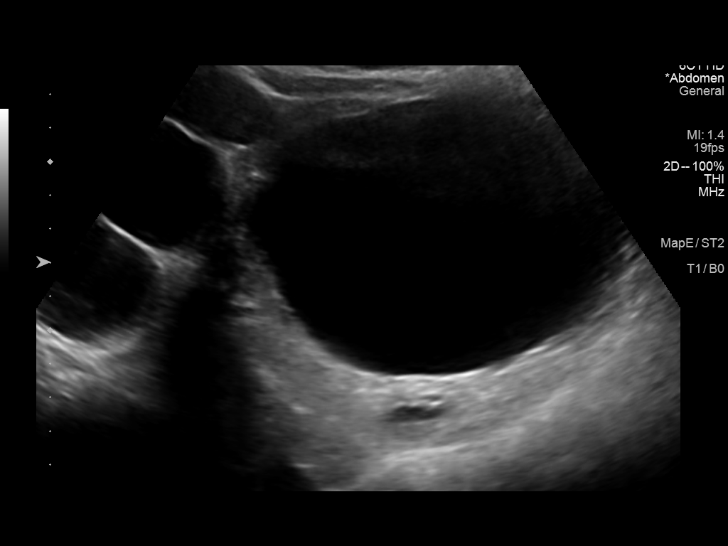
[im 36/108]
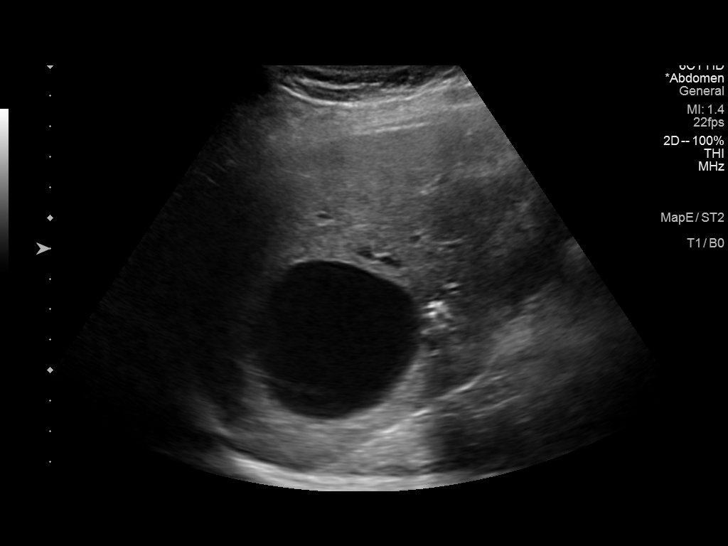
[im 41/108]
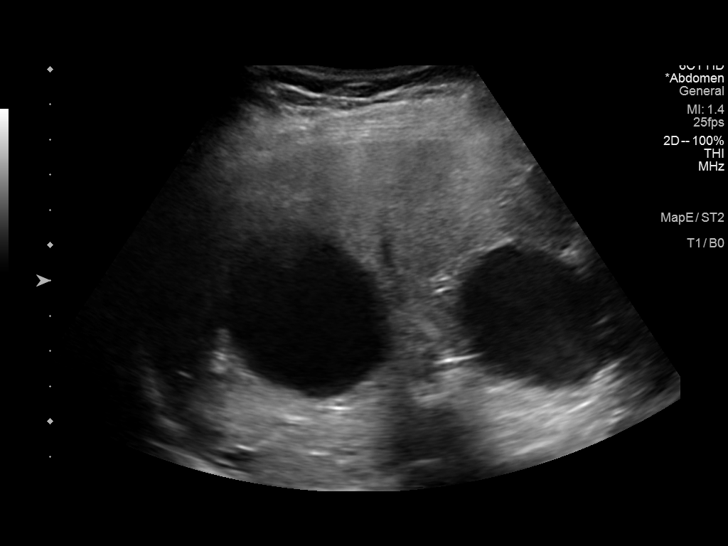
[im 50/108]
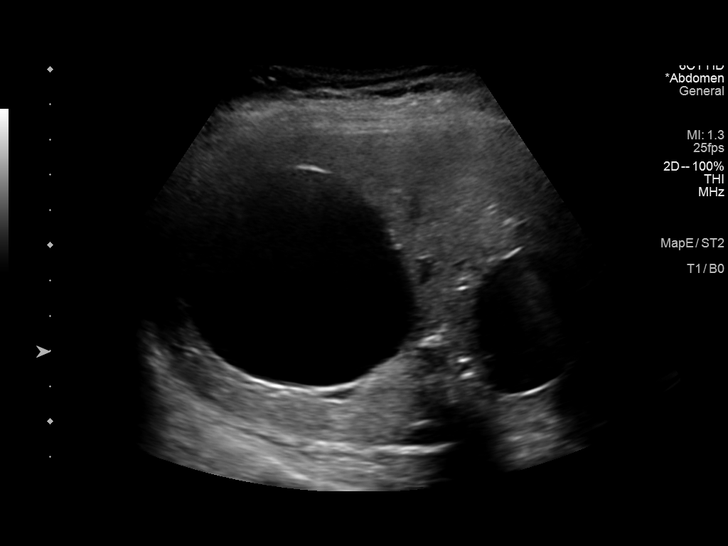
[im 58/108]
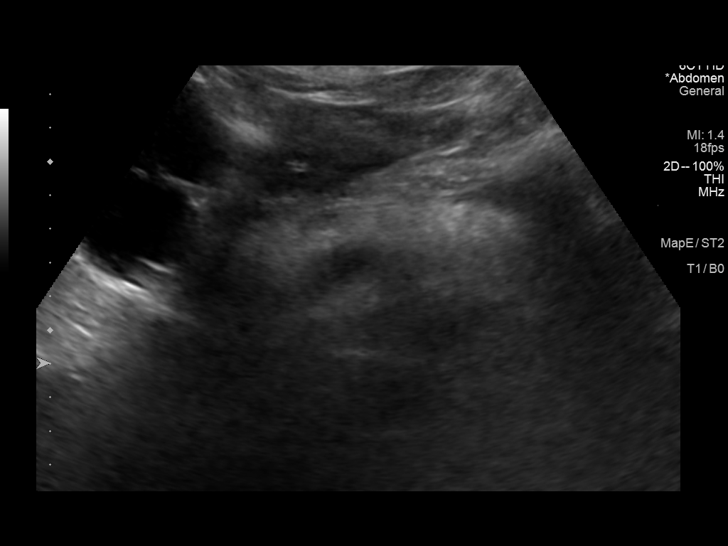
[im 67/108]
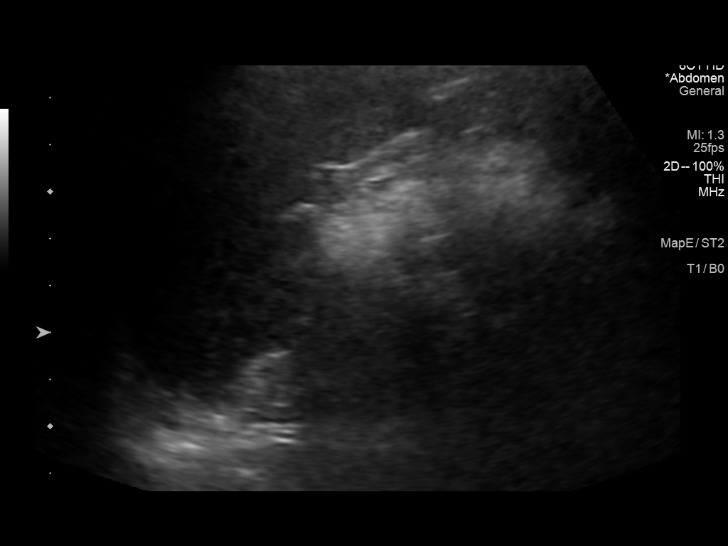
[im 72/108]
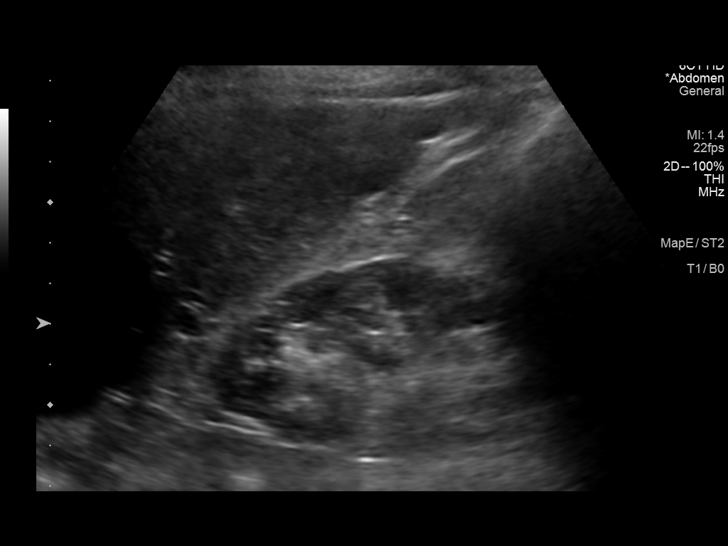
[im 81/108]
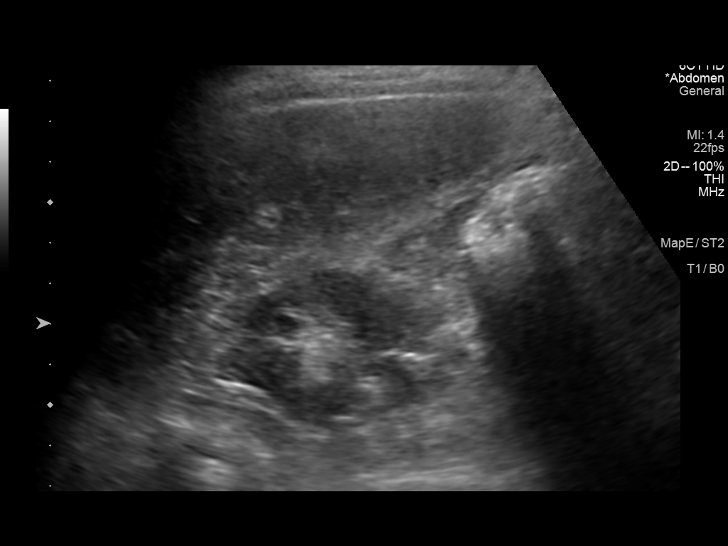
[im 90/108]
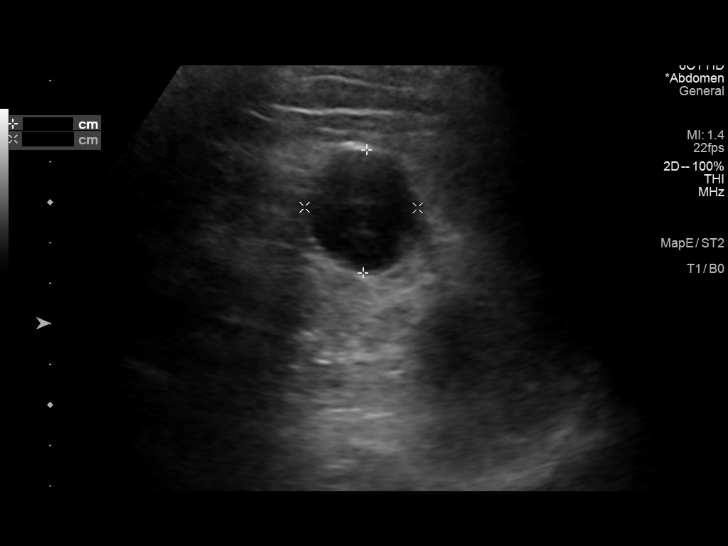
[im 99/108]
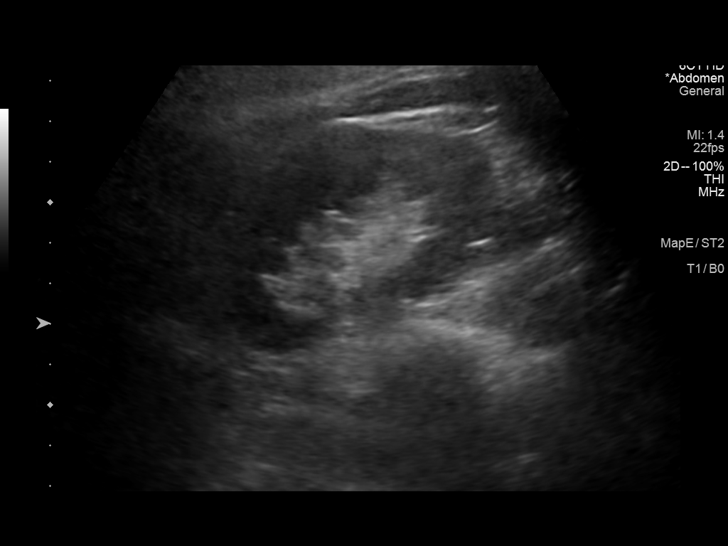
[im 108/108]
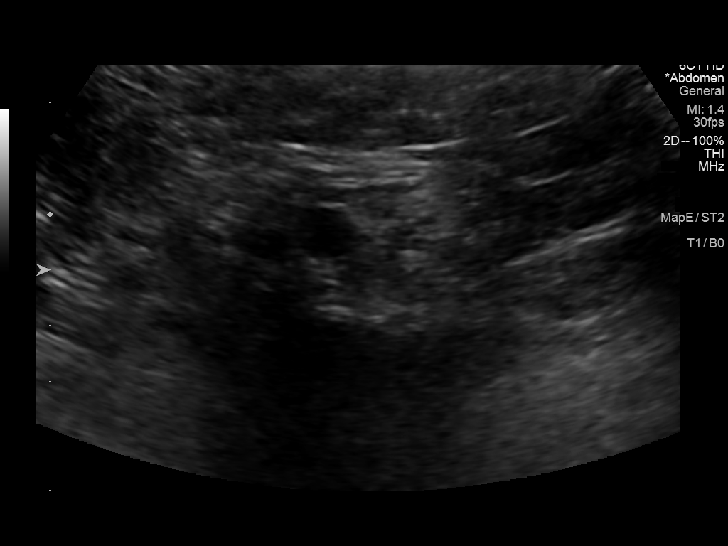

[14 of 25 positions shown; findings below may reference images not displayed]

FINDINGS: Gallbladder: No gallstones or wall thickening visualized. No
sonographic Murphy sign noted by sonographer.

Common bile duct: Diameter: 4 mm

Liver: Multiple cysts are demonstrated throughout the liver largest
measuring up to 10.7 cm. Portal vein is patent on color Doppler
imaging with normal direction of blood flow towards the liver.

IVC: No abnormality visualized.

Pancreas: Visualized portion unremarkable.

Spleen: Size and appearance within normal limits.

Right Kidney: Length: 9.6 cm. Echogenicity within normal limits. No
mass or hydronephrosis visualized.

Left Kidney: Length: 9.9 cm. Echogenicity within normal limits. No
mass or hydronephrosis visualized. Within the superior pole there is
a 3.0 x 3.0 x 2.8 cm cyst.

Abdominal aorta: No aneurysm visualized.

Other findings: None.
IMPRESSION: Multiple cysts within the liver.

No acute process.

## 2023-12-15 ENCOUNTER — Other Ambulatory Visit: Payer: Self-pay

## 2023-12-15 ENCOUNTER — Emergency Department (HOSPITAL_BASED_OUTPATIENT_CLINIC_OR_DEPARTMENT_OTHER)
Admission: EM | Admit: 2023-12-15 | Discharge: 2023-12-15 | Disposition: A | Discharge status: Home / Self Care | Source: Ambulatory Visit | Attending: Emergency Medicine | Admitting: Emergency Medicine

## 2023-12-15 ENCOUNTER — Emergency Department (HOSPITAL_BASED_OUTPATIENT_CLINIC_OR_DEPARTMENT_OTHER)

## 2023-12-15 DIAGNOSIS — K859 Acute pancreatitis without necrosis or infection, unspecified: Secondary | ICD-10-CM

## 2023-12-15 DIAGNOSIS — R1012 Left upper quadrant pain: Secondary | ICD-10-CM | POA: Insufficient documentation

## 2023-12-15 DIAGNOSIS — Z79899 Other long term (current) drug therapy: Secondary | ICD-10-CM | POA: Diagnosis not present

## 2023-12-15 DIAGNOSIS — I10 Essential (primary) hypertension: Secondary | ICD-10-CM | POA: Diagnosis not present

## 2023-12-15 LAB — URINALYSIS, ROUTINE W REFLEX MICROSCOPIC
Bilirubin Urine: NEGATIVE
Glucose, UA: NEGATIVE mg/dL
Ketones, ur: NEGATIVE mg/dL
Leukocytes,Ua: NEGATIVE
Nitrite: NEGATIVE
Protein, ur: NEGATIVE mg/dL
Specific Gravity, Urine: 1.025 (ref 1.005–1.030)
pH: 5.5 (ref 5.0–8.0)

## 2023-12-15 LAB — URINALYSIS, MICROSCOPIC (REFLEX)

## 2023-12-15 LAB — CBC
HCT: 35.5 % — ABNORMAL LOW (ref 36.0–46.0)
Hemoglobin: 11.8 g/dL — ABNORMAL LOW (ref 12.0–15.0)
MCH: 28.8 pg (ref 26.0–34.0)
MCHC: 33.2 g/dL (ref 30.0–36.0)
MCV: 86.6 fL (ref 80.0–100.0)
Platelets: 209 K/uL (ref 150–400)
RBC: 4.1 MIL/uL (ref 3.87–5.11)
RDW: 13.9 % (ref 11.5–15.5)
WBC: 7.8 K/uL (ref 4.0–10.5)
nRBC: 0 % (ref 0.0–0.2)

## 2023-12-15 LAB — COMPREHENSIVE METABOLIC PANEL WITH GFR
ALT: 10 U/L (ref 0–44)
AST: 20 U/L (ref 15–41)
Albumin: 4 g/dL (ref 3.5–5.0)
Alkaline Phosphatase: 69 U/L (ref 38–126)
Anion gap: 12 (ref 5–15)
BUN: 22 mg/dL (ref 8–23)
CO2: 27 mmol/L (ref 22–32)
Calcium: 9.7 mg/dL (ref 8.9–10.3)
Chloride: 102 mmol/L (ref 98–111)
Creatinine, Ser: 0.99 mg/dL (ref 0.44–1.00)
GFR, Estimated: 56 mL/min — ABNORMAL LOW (ref >60)
Glucose, Bld: 93 mg/dL (ref 70–99)
Potassium: 4.1 mmol/L (ref 3.5–5.1)
Sodium: 140 mmol/L (ref 135–145)
Total Bilirubin: 0.2 mg/dL (ref 0.0–1.2)
Total Protein: 7.8 g/dL (ref 6.5–8.1)

## 2023-12-15 LAB — LIPASE, BLOOD: Lipase: 68 U/L — ABNORMAL HIGH (ref 11–51)

## 2023-12-15 MED ORDER — HYDRALAZINE HCL 20 MG/ML IJ SOLN
10.0000 mg | Freq: Once | INTRAMUSCULAR | Status: AC
  Administered 2023-12-15: 10 mg via INTRAVENOUS
  Filled 2023-12-15: qty 1

## 2023-12-15 MED ORDER — IOHEXOL 350 MG/ML SOLN
100.0000 mL | Freq: Once | INTRAVENOUS | Status: AC | PRN
  Administered 2023-12-15: 100 mL via INTRAVENOUS

## 2023-12-15 MED ORDER — ONDANSETRON 4 MG PO TBDP: 4.0000 mg | ORAL_TABLET | Freq: Three times a day (TID) | ORAL | 0 refills | Status: DC | PRN

## 2023-12-15 MED ORDER — OXYCODONE-ACETAMINOPHEN 5-325 MG PO TABS: 1.0000 | ORAL_TABLET | Freq: Four times a day (QID) | ORAL | 0 refills | Status: DC | PRN

## 2023-12-15 MED ORDER — MORPHINE SULFATE (PF) 4 MG/ML IV SOLN
4.0000 mg | Freq: Once | INTRAVENOUS | Status: AC
Start: 2023-12-15 — End: 2023-12-15
  Administered 2023-12-15: 4 mg via INTRAVENOUS
  Filled 2023-12-15: qty 1

## 2023-12-15 NOTE — ED Notes (Signed)
 Pt given DC instructions.  Pt was understanding.  Vitals obtained.  Pt will dress and exit room.

## 2023-12-15 NOTE — Discharge Instructions (Signed)
 Your history, exam, and workup today seem consistent with acute pancreatitis causing the left upper abdomen pain.  The CT scan did not show an acute surgical problem or problem with your aorta such as dissection or aneurysm.  Your blood pressure improved with medications and we feel you are safe for discharge home now.  Please use the medicines to help with symptoms and please rest and stay hydrated.  Please follow-up with your primary doctor.  If any symptoms change or worsen acutely, please return to the nearest emergency department.

## 2023-12-15 NOTE — ED Notes (Signed)
 IV access was lost at CT.  [facility] capable employee handed to CT to manage same.

## 2023-12-15 NOTE — ED Triage Notes (Signed)
 Teleinterpreter Leita206 [phone]  Pt c/o LUQ abd pain x5 days.  Denies n/v/d/fever.   Good po intake.

## 2023-12-15 NOTE — ED Notes (Signed)
 ED Provider at bedside.

## 2023-12-15 NOTE — ED Notes (Signed)
PT returned from CT at this time

## 2023-12-15 NOTE — ED Provider Notes (Signed)
 [place] [facility] Provider Note   CSN: [id number] Arrival date & time: 12/15/23  1845     Patient presents with: Abdominal Pain   Karen [name] is a 83 y.o. female.   The history is provided by medical records and the patient. No language interpreter was used.  Abdominal Pain Pain location:  LUQ Pain quality: aching and dull   Pain radiates to:  Does not radiate Pain severity:  Severe Onset quality:  Gradual Duration:  5 days Timing:  Constant Progression:  Waxing and waning Chronicity:  New Context: not alcohol use, not previous surgeries, not recent illness, not sick contacts, not suspicious food intake and not trauma   Relieved by:  Nothing Worsened by:  Palpation and movement Ineffective treatments:  None tried Associated symptoms: no chest pain, no chills, no constipation, no cough, no diarrhea, no dysuria, no fatigue, no fever, no nausea, no shortness of breath and no vomiting        Prior to Admission medications   Medication Sig Start Date End Date Taking? Authorizing Provider  ondansetron  (ZOFRAN -ODT) 4 MG disintegrating tablet Take 1 tablet (4 mg total) by mouth every 8 (eight) hours as needed for nausea or vomiting. 12/15/23  Yes Shyne Lehrke, Lonni PARAS, MD  oxyCODONE -acetaminophen  (PERCOCET/ROXICET) 5-325 MG tablet Take 1 tablet by mouth every 6 (six) hours as needed for severe pain (pain score 7-10). 12/15/23  Yes Marialena Wollen, Lonni PARAS, MD  alendronate  (FOSAMAX ) 70 MG tablet Take 1 tablet (70 mg total) by mouth every 7 (seven) days. Take with a full glass of water on an empty stomach. 05/19/21   Thedora Garnette HERO, MD  Cholecalciferol 50 MCG (2000 UT) TABS Take 2,000 Units by mouth daily. 09/05/18   [provider]  cyclobenzaprine  (FLEXERIL ) 5 MG tablet Take 1 tablet (5 mg total) by mouth 2 (two) times daily as needed for muscle spasms. 09/05/22   Mayer, Jodi R, NP  ibuprofen  (ADVIL ) 800 MG tablet Take 1 tablet (800 mg  total) by mouth every 8 (eight) hours as needed for moderate pain. 06/13/19   Alvia Corean CROME, FNP  losartan -hydrochlorothiazide (HYZAAR) 100-25 MG tablet TAKE 1 TABLET BY MOUTH DAILY 02/10/22   Thedora Garnette HERO, MD  meclizine  (ANTIVERT ) 12.5 MG tablet Take 1 tablet (12.5 mg total) by mouth 3 (three) times daily as needed for dizziness. 09/18/20   Cirigliano, Ronal POUR, DO  predniSONE  (DELTASONE ) 10 MG tablet Take 2 tablets (20 mg total) by mouth daily. 09/13/22   Victor Lynwood DASEN, PA-C  tobramycin  (TOBREX ) 0.3 % ophthalmic solution Place 1 drop into the left eye every 4 (four) hours. 03/14/21   Jalesha Plotz Savannah, PA-C  vitamin C (ASCORBIC ACID) 250 MG tablet Take 250 mg by mouth daily.    [provider]    Allergies: Patient has no known allergies.    Review of Systems  Constitutional:  Negative for chills, fatigue and fever.  HENT:  Negative for congestion.   Respiratory:  Negative for cough, chest tightness, shortness of breath and wheezing.   Cardiovascular:  Negative for chest pain, palpitations and leg swelling.  Gastrointestinal:  Positive for abdominal pain. Negative for abdominal distention, constipation, diarrhea, nausea and vomiting.  Genitourinary:  Negative for dysuria and flank pain.  Musculoskeletal:  Negative for back pain, neck pain and neck stiffness.  Skin:  Negative for rash and wound.  Neurological:  Negative for light-headedness and headaches.  Psychiatric/Behavioral:  Negative for agitation and confusion.   All other  systems reviewed and are negative.   Updated Vital Signs BP (!) 184/97   Pulse 65   Temp 98.1 F (36.7 C)   Resp 16   Ht 5' 3 (1.6 m)   Wt 68 kg   SpO2 100%   BMI 26.57 kg/m   Physical Exam Vitals and nursing note reviewed.  Constitutional:      General: She is not in acute distress.    Appearance: She is well-developed. She is not ill-appearing, toxic-appearing or diaphoretic.  HENT:     Head: Normocephalic and atraumatic.  Eyes:      Conjunctiva/sclera: Conjunctivae normal.  Cardiovascular:     Rate and Rhythm: Normal rate and regular rhythm.     Heart sounds: No murmur heard. Pulmonary:     Effort: Pulmonary effort is normal. No respiratory distress.     Breath sounds: Normal breath sounds. No wheezing, rhonchi or rales.  Chest:     Chest wall: No tenderness.  Abdominal:     General: Abdomen is flat. Bowel sounds are normal.     Palpations: Abdomen is soft.     Tenderness: There is abdominal tenderness in the left upper quadrant. There is no right CVA tenderness or left CVA tenderness.  Musculoskeletal:        General: No swelling.     Cervical back: Neck supple.  Skin:    General: Skin is warm and dry.     Capillary Refill: Capillary refill takes less than 2 seconds.     Coloration: Skin is not pale.     Findings: No rash.  Neurological:     General: No focal deficit present.     Mental Status: She is alert.  Psychiatric:        Mood and Affect: Mood normal.     (all labs ordered are listed, but only abnormal results are displayed) Labs Reviewed  LIPASE, BLOOD - Abnormal; Notable for the following components:      Result Value   Lipase 68 (*)    All other components within normal limits  COMPREHENSIVE METABOLIC PANEL WITH GFR - Abnormal; Notable for the following components:   GFR, Estimated 56 (*)    All other components within normal limits  CBC - Abnormal; Notable for the following components:   Hemoglobin 11.8 (*)    HCT 35.5 (*)    All other components within normal limits  URINALYSIS, ROUTINE W REFLEX MICROSCOPIC - Abnormal; Notable for the following components:   Hgb urine dipstick TRACE (*)    All other components within normal limits  URINALYSIS, MICROSCOPIC (REFLEX) - Abnormal; Notable for the following components:   Bacteria, UA RARE (*)    All other components within normal limits    EKG: EKG Interpretation Date/Time:  Thursday December 15 2023 18:54:02 EDT Ventricular Rate:   57 PR Interval:  168 QRS Duration:  87 QT Interval:  439 QTC Calculation: 428 R Axis:   62  Text Interpretation: Sinus rhythm Abnormal R-wave progression, early transition when compared top rior, similar appearance with more wandering baseline and artifact No STEMI Confirmed by Ginger Barefoot ([id number]) on 12/15/2023 6:58:30 PM  Radiology: CT Angio Chest/Abd/Pel for Dissection W and/or Wo Contrast Result Date: 12/15/2023 CLINICAL DATA:  Hypertension left upper quadrant pain EXAM: CT ANGIOGRAPHY CHEST, ABDOMEN AND PELVIS TECHNIQUE: Non-contrast CT of the chest was initially obtained. Multidetector CT imaging through the chest, abdomen and pelvis was performed using the standard protocol during bolus administration of intravenous contrast. Multiplanar reconstructed images  and MIPs were obtained and reviewed to evaluate the vascular anatomy. RADIATION DOSE REDUCTION: This exam was performed according to the departmental dose-optimization program which includes automated exposure control, adjustment of the mA and/or kV according to patient size and/or use of iterative reconstruction technique. CONTRAST:  OMNIPAQUE  IOHEXOL  350 MG/ML SOLN COMPARISON:  Ultrasound 09/03/2021, CT report 03/20/2019 FINDINGS: CTA CHEST FINDINGS Cardiovascular: Non contrasted images of the chest demonstrate no acute intramural hematoma. Left-sided aortic arch with normal three-vessel origin. Mild aortic atherosclerosis. Negative for aneurysm or dissection. Borderline to mild cardiomegaly. No pericardial effusion. Mediastinum/Nodes: Patent trachea. No thyroid mass. No suspicious lymph nodes. Esophagus within normal limits except for small hiatal hernia. Lungs/Pleura: No acute airspace disease, pleural effusion or pneumothorax. Musculoskeletal: Acute or suspicious osseous abnormality. Review of the MIP images confirms the above findings. CTA ABDOMEN AND PELVIS FINDINGS VASCULAR Aorta: Normal caliber aorta without aneurysm,  dissection, vasculitis or significant stenosis. Moderate aortic atherosclerosis Celiac: Patent without evidence of aneurysm, dissection, vasculitis or significant stenosis. SMA: Patent without evidence of aneurysm, dissection, vasculitis or significant stenosis. Renals: Both renal arteries are patent without evidence of aneurysm, dissection, vasculitis, fibromuscular dysplasia or significant stenosis. IMA: Patent without evidence of aneurysm, dissection, vasculitis or significant stenosis. Inflow: Patent without evidence of aneurysm, dissection, vasculitis or significant stenosis. Veins: Suboptimally assessed Review of the MIP images confirms the above findings. NON-VASCULAR Hepatobiliary: No calcified gallstone or biliary dilatation. Multiple large hepatic cysts. Left hepatic lobe cyst measures up to 11.3 cm. Pancreas: Unremarkable. No pancreatic ductal dilatation or surrounding inflammatory changes. Spleen: Normal in size without focal abnormality. Adrenals/Urinary Tract: Adrenal glands are within normal limits. Kidneys show no hydronephrosis. 3 cm left renal cyst, no imaging follow-up is recommended. The bladder is unremarkable. Stomach/Bowel: The stomach is nonenlarged. Air-filled mildly distended small bowel loops within the anterior abdomen but no wall thickening or obstructive features. Negative appendix. Diverticular disease of the left colon. Lymphatic: No suspicious lymph node Reproductive: Uterus and bilateral adnexa are unremarkable. Other: Negative for pelvic effusion or free air. Small fat containing umbilical hernia Musculoskeletal: No acute or suspicious osseous abnormality Review of the MIP images confirms the above findings. IMPRESSION: 1. Negative for acute aortic dissection or aneurysm. 2. No CT evidence for acute intra-abdominal or pelvic abnormality. 3. 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 5. Aortic atherosclerosis. Aortic  Atherosclerosis (ICD10-I70.0). Electronically Signed   By: Luke Bun M.D.   On: 12/15/2023 23:15     Procedures   Medications Ordered in the ED  morphine  (PF) 4 MG/ML injection 4 mg (4 mg Intravenous Given 12/15/23 2114)  hydrALAZINE  (APRESOLINE ) injection 10 mg (10 mg Intravenous Given 12/15/23 2114)  iohexol  (OMNIPAQUE ) 350 MG/ML injection 100 mL (100 mLs Intravenous Contrast Given 12/15/23 2246)                                    Medical Decision Making Amount and/or Complexity of Data Reviewed Labs: ordered. Radiology: ordered.  Risk Prescription drug management.    Karen [name] is a 83 y.o. female with a past medical history significant for hypertension, GERD, and osteoporosis who presents with 6 days of worsening left upper quadrant abdominal pain.  According to patient, she has felt ill for the last 5 or 6 days with pain in her left upper abdomen.  She reports no nausea, vomiting, constipation, diarrhea, or urinary changes.  Denies  fevers or chills peer denies rashes or shingles and denies any trauma.  She denies any history of this pain.  No history of previous pancreatitis and she does not drink alcohol she reports.  Denies any new medications denies greasy or spicy foods.  Denies any chest pain or shortness of breath but it does hurt her abdomen when she breathes.  She denies other complaints but reports the pain is up to 10 out of 10 in severity and has not improved with the last several days.  On exam, lungs clear.  Chest nontender.  There was not a murmur.  Abdomen was quite tender in the left upper abdomen but rest of abdomen nontender.  Good bowel sounds.  Flanks and back nontender.  Patient does have slightly dry mucous membranes.  Legs nontender nonedematous and she has good pulses.  Blood pressure found to be over 200 systolic.  She is also slightly bradycardic with a rate in the 50s.  Patient had some workup in triage and she does have a slightly elevated lipase  which could reflect some early pancreatitis given the left upper quadrant abdominal pain however is only in the 60s and not significantly elevated.  More concerning is this blood pressure over 200 with the severe 10 out of 10 pain in this 83 year old.  With the blood pressure elevation, will order a dissection study to rule out any more pressing surgical problem and we will give her some medicine to help with the pain.  Anticipate follow-up on labs, imaging, and reassessment..  Patient could just have mild pancreatitis and the pain is driving her blood pressure up but anticipate reassessment after workup to determine disposition.  11:18 PM CT scan just returned without evidence of acute surgical problem.  It shows hepatic cysts but no acute intra-abdominal pathology at this time.  No aortic dissection or aneurysm and no acute diverticulitis.  Suspect mild pancreatitis given the left upper quadrant abdominal discomfort and the lipase elevation.  Will reassess patient and if she is feeling better and vital signs have improved, anticipate discharge home for outpatient follow-up.      Final diagnoses:  Left upper quadrant abdominal pain    Clinical Impression: 1. Left upper quadrant abdominal pain     Disposition: Admit  This note was prepared with assistance of Dragon voice recognition software. Occasional wrong-word or sound-a-like substitutions may have occurred due to the inherent limitations of voice recognition software.      Kaybree Williams, Lonni PARAS, MD 12/15/23 [phone]

## 2023-12-15 NOTE — ED Notes (Signed)
 During [facility] procedure pt was beinging to brady down.  Staff at bedside preformed EKG, given to MD. pt notes feeling really nervous.  This medic observed the pt to be pale and diaphoretic.  Vitals obtained and stable.  Pt was placed in a semi recovery position and coached through this event.  CT tech arrived at bedside and pt noted that she was feeling better.  CT tech took pt to scan in stretcher. Will continue to monitor.

## 2023-12-20 ENCOUNTER — Encounter: Payer: Self-pay | Admitting: Gastroenterology

## 2023-12-27 ENCOUNTER — Ambulatory Visit (INDEPENDENT_AMBULATORY_CARE_PROVIDER_SITE_OTHER): Admitting: Gastroenterology

## 2023-12-27 ENCOUNTER — Other Ambulatory Visit: Payer: Self-pay | Admitting: Gastroenterology

## 2023-12-27 ENCOUNTER — Other Ambulatory Visit (INDEPENDENT_AMBULATORY_CARE_PROVIDER_SITE_OTHER)

## 2023-12-27 ENCOUNTER — Encounter: Payer: Self-pay | Admitting: Gastroenterology

## 2023-12-27 VITALS — BP 130/78 | HR 69 | Ht 65.0 in | Wt 155.0 lb

## 2023-12-27 DIAGNOSIS — R1012 Left upper quadrant pain: Secondary | ICD-10-CM | POA: Diagnosis not present

## 2023-12-27 DIAGNOSIS — Q446 Cystic disease of liver: Secondary | ICD-10-CM

## 2023-12-27 DIAGNOSIS — K7689 Other specified diseases of liver: Secondary | ICD-10-CM

## 2023-12-27 DIAGNOSIS — R1010 Upper abdominal pain, unspecified: Secondary | ICD-10-CM

## 2023-12-27 DIAGNOSIS — Z1211 Encounter for screening for malignant neoplasm of colon: Secondary | ICD-10-CM

## 2023-12-27 LAB — LIPASE: Lipase: 42 U/L (ref 11.0–59.0)

## 2023-12-27 MED ORDER — PANTOPRAZOLE SODIUM 40 MG PO TBEC: 40.0000 mg | DELAYED_RELEASE_TABLET | Freq: Every day | ORAL | 1 refills | Status: DC

## 2023-12-27 MED ORDER — SUTAB 1479-225-188 MG PO TABS: 24.0000 | ORAL_TABLET | Freq: Once | ORAL | 0 refills | Status: AC

## 2023-12-27 MED ORDER — NA SULFATE-K SULFATE-MG SULF 17.5-3.13-1.6 GM/177ML PO SOLN
1.0000 | Freq: Once | ORAL | 0 refills | Status: DC
Start: 2023-12-27 — End: 2023-12-27

## 2023-12-27 NOTE — Patient Instructions (Signed)
 Your provider has requested that you go to the basement level for lab work before leaving today. Press B on the elevator. The lab is located at the first door on the left as you exit the elevator.  We have sent the following medications to your pharmacy for you to pick up at your convenience:  Pantoprazole .  You have been scheduled for an endoscopy. Please follow written instructions given to you at your visit today.  If you use inhalers (even only as needed), please bring them with you on the day of your procedure.  If you take any of the following medications, they will need to be adjusted prior to your procedure:   DO NOT TAKE 7 DAYS PRIOR TO TEST- Trulicity (dulaglutide) Ozempic, Wegovy (semaglutide) Mounjaro (tirzepatide) Bydureon Bcise (exanatide extended release)  DO NOT TAKE 1 DAY PRIOR TO YOUR TEST Rybelsus (semaglutide) Adlyxin (lixisenatide) Victoza (liraglutide) Byetta (exanatide) ___________________________________________________________________________  You have been scheduled for a colonoscopy. Please follow written instructions given to you at your visit today.   If you use inhalers (even only as needed), please bring them with you on the day of your procedure.  DO NOT TAKE 7 DAYS PRIOR TO TEST- Trulicity (dulaglutide) Ozempic, Wegovy (semaglutide) Mounjaro (tirzepatide) Bydureon Bcise (exanatide extended release)  DO NOT TAKE 1 DAY PRIOR TO YOUR TEST Rybelsus (semaglutide) Adlyxin (lixisenatide) Victoza (liraglutide) Byetta (exanatide) ___________________________________________________________________________  _______________________________________________________  If your blood pressure at your visit was 140/90 or greater, please contact your primary care physician to follow up on this.  _______________________________________________________  If you are age 84 or older, your body mass index should be between 23-30. Your Body mass index is 25.79  kg/m. If this is out of the aforementioned range listed, please consider follow up with your Primary Care Provider.  If you are age 10 or younger, your body mass index should be between 19-25. Your Body mass index is 25.79 kg/m. If this is out of the aformentioned range listed, please consider follow up with your Primary Care Provider.   ________________________________________________________  The [facility] providers would like to encourage you to use MYCHART to communicate with providers for non-urgent requests or questions.  Due to long hold times on the telephone, sending your provider a message by [facility] may be a faster and more efficient way to get a response.  Please allow 48 business hours for a response.  Please remember that this is for non-urgent requests.  _______________________________________________________  [facility] is using a team-based approach to care.  Your team is made up of your doctor and two to three APPS. Our APPS (Nurse Practitioners and Physician Assistants) work with your physician to ensure care continuity for you. They are fully qualified to address your health concerns and develop a treatment plan. They communicate directly with your gastroenterologist to care for you. Seeing the Advanced Practice Practitioners on your physician's team can help you by facilitating care more promptly, often allowing for earlier appointments, access to diagnostic testing, procedures, and other specialty referrals.

## 2023-12-27 NOTE — Progress Notes (Signed)
 Discussed the use of AI scribe software for clinical note transcription with the patient, who gave verbal consent to proceed.  HPI : Karen [name] is a 83 y.o. female who presents to our clinic for further evaluation of severe abdominal pain. She presented to the emergency department August 28 with 6 days of unrelenting left upper quadrant abdominal pain.  CT scan was performed which showed numerous large hepatic cysts, with the largest measuring 11 cm, but no other abnormalities to explain severe abdominal pain.  Her lipase was slightly elevated at 68, and her ER physician thought that her pain may be from pancreatitis.  CBC and CMP otherwise unremarkable.  She had an ultrasound in 2023 which showed multiple cysts, but no other intra-abdominal imaging for comparison.  Today, she is accompanied by her daughter in the office today, and reports ongoing significant abdominal pain.  It is only slightly improved from what it was a week ago.  It is absent when she is sitting or lying still but intensifies with movement, such as standing, walking, coughing, sneezing, or deep breathing. The pain is localized to the upper abdomen, primarily on the left side.  She has adjusted her diet to include lighter foods like cream, fruits, and vegetables due to concern about aggravating the pain, although eating does not seem to exacerbate it. She has experienced a weight loss of five pounds over the past couple of weeks. No nausea, vomiting, diarrhea, or fever.   Bowel movements are regular, occurring once daily, with no diarrhea or watery stools. She experienced constipation for three to four days after taking pain medication prescribed in the emergency room.  She has not undergone a colonoscopy or upper endoscopy before.  She is currently taking oxycodone  for pain relief, primarily at night to avoid daytime drowsiness, and she does not take any other pain medications like Tylenol  regularly. She has not been  taking any acid-reducing medications or NSAIDs.       CT December 15, 2023 IMPRESSION: 1. Negative for acute aortic dissection or aneurysm. 2. No CT evidence for acute intra-abdominal or pelvic abnormality. 3. 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 5. Aortic atherosclerosis.   Aortic Atherosclerosis (ICD10-I70.0).   Lipase level 68 in the ER.   Ultrasound abdomen 2023 IMPRESSION: Multiple cysts within the liver.   No acute process.  Past Medical History:  Diagnosis Date   Hypertension      No past surgical history on file. Family History  Problem Relation Age of Onset   Hypertension Mother    Hyperlipidemia Mother    Hypertension Father    Colon cancer Neg Hx    Esophageal cancer Neg Hx    Social History   Tobacco Use   Smoking status: Never   Smokeless tobacco: Never  Vaping Use   Vaping status: Never Used  Substance Use Topics   Alcohol use: No   Drug use: No   Current Outpatient Medications  Medication Sig Dispense Refill   cyclobenzaprine  (FLEXERIL ) 5 MG tablet Take 1 tablet (5 mg total) by mouth 2 (two) times daily as needed for muscle spasms. 10 tablet 0   ibuprofen  (ADVIL ) 800 MG tablet Take 1 tablet (800 mg total) by mouth every 8 (eight) hours as needed for moderate pain. 21 tablet 0   losartan -hydrochlorothiazide (HYZAAR) 100-25 MG tablet TAKE 1 TABLET BY MOUTH DAILY 90 tablet 0   meclizine  (ANTIVERT ) 12.5 MG tablet Take 1 tablet (12.5 mg  total) by mouth 3 (three) times daily as needed for dizziness. 60 tablet 0   ondansetron  (ZOFRAN -ODT) 4 MG disintegrating tablet Take 1 tablet (4 mg total) by mouth every 8 (eight) hours as needed for nausea or vomiting. 20 tablet 0   oxyCODONE -acetaminophen  (PERCOCET/ROXICET) 5-325 MG tablet Take 1 tablet by mouth every 6 (six) hours as needed for severe pain (pain score 7-10). 15 tablet 0   tobramycin  (TOBREX ) 0.3 % ophthalmic solution Place 1  drop into the left eye every 4 (four) hours. (Patient taking differently: Place 1 drop into the left eye every 4 (four) hours. As needed) 5 mL 0   alendronate  (FOSAMAX ) 70 MG tablet Take 1 tablet (70 mg total) by mouth every 7 (seven) days. Take with a full glass of water on an empty stomach. (Patient not taking: Reported on 12/27/2023) 4 tablet 11   Cholecalciferol 50 MCG (2000 UT) TABS Take 2,000 Units by mouth daily. (Patient not taking: Reported on 12/27/2023)     predniSONE  (DELTASONE ) 10 MG tablet Take 2 tablets (20 mg total) by mouth daily. 15 tablet 0   vitamin C (ASCORBIC ACID) 250 MG tablet Take 250 mg by mouth daily. (Patient not taking: Reported on 12/27/2023)     No current facility-administered medications for this visit.   No Known Allergies   Review of Systems: All systems reviewed and negative except where noted in HPI.    CT Angio Chest/Abd/Pel for Dissection W and/or Wo Contrast Result Date: 12/15/2023 CLINICAL DATA:  Hypertension left upper quadrant pain EXAM: CT ANGIOGRAPHY CHEST, ABDOMEN AND PELVIS TECHNIQUE: Non-contrast CT of the chest was initially obtained. Multidetector CT imaging through the chest, abdomen and pelvis was performed using the standard protocol during bolus administration of intravenous contrast. Multiplanar reconstructed images and MIPs were obtained and reviewed to evaluate the vascular anatomy. RADIATION DOSE REDUCTION: This exam was performed according to the departmental dose-optimization program which includes automated exposure control, adjustment of the mA and/or kV according to patient size and/or use of iterative reconstruction technique. CONTRAST:  OMNIPAQUE  IOHEXOL  350 MG/ML SOLN COMPARISON:  Ultrasound 09/03/2021, CT report 03/20/2019 FINDINGS: CTA CHEST FINDINGS Cardiovascular: Non contrasted images of the chest demonstrate no acute intramural hematoma. Left-sided aortic arch with normal three-vessel origin. Mild aortic atherosclerosis. Negative  for aneurysm or dissection. Borderline to mild cardiomegaly. No pericardial effusion. Mediastinum/Nodes: Patent trachea. No thyroid mass. No suspicious lymph nodes. Esophagus within normal limits except for small hiatal hernia. Lungs/Pleura: No acute airspace disease, pleural effusion or pneumothorax. Musculoskeletal: Acute or suspicious osseous abnormality. Review of the MIP images confirms the above findings. CTA ABDOMEN AND PELVIS FINDINGS VASCULAR Aorta: Normal caliber aorta without aneurysm, dissection, vasculitis or significant stenosis. Moderate aortic atherosclerosis Celiac: Patent without evidence of aneurysm, dissection, vasculitis or significant stenosis. SMA: Patent without evidence of aneurysm, dissection, vasculitis or significant stenosis. Renals: Both renal arteries are patent without evidence of aneurysm, dissection, vasculitis, fibromuscular dysplasia or significant stenosis. IMA: Patent without evidence of aneurysm, dissection, vasculitis or significant stenosis. Inflow: Patent without evidence of aneurysm, dissection, vasculitis or significant stenosis. Veins: Suboptimally assessed Review of the MIP images confirms the above findings. NON-VASCULAR Hepatobiliary: No calcified gallstone or biliary dilatation. Multiple large hepatic cysts. Left hepatic lobe cyst measures up to 11.3 cm. Pancreas: Unremarkable. No pancreatic ductal dilatation or surrounding inflammatory changes. Spleen: Normal in size without focal abnormality. Adrenals/Urinary Tract: Adrenal glands are within normal limits. Kidneys show no hydronephrosis. 3 cm left renal cyst, no imaging follow-up is recommended. The  bladder is unremarkable. Stomach/Bowel: The stomach is nonenlarged. Air-filled mildly distended small bowel loops within the anterior abdomen but no wall thickening or obstructive features. Negative appendix. Diverticular disease of the left colon. Lymphatic: No suspicious lymph node Reproductive: Uterus and bilateral  adnexa are unremarkable. Other: Negative for pelvic effusion or free air. Small fat containing umbilical hernia Musculoskeletal: No acute or suspicious osseous abnormality Review of the MIP images confirms the above findings. IMPRESSION: 1. Negative for acute aortic dissection or aneurysm. 2. No CT evidence for acute intra-abdominal or pelvic abnormality. 3. 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 5. Aortic atherosclerosis. Aortic Atherosclerosis (ICD10-I70.0). Electronically Signed   By: Luke Bun M.D.   On: 12/15/2023 23:15    Physical Exam: BP 130/78   Pulse 69   Ht 5' 5 (1.651 m)   Wt 155 lb (70.3 kg)   BMI 25.79 kg/m  Constitutional: Pleasant,well-developed, Hispanic female in no acute distress.  She understands English well, but sometimes needs help translating what she wants to say.  Her daughter is there and provides translation. HEENT: Normocephalic and atraumatic. Conjunctivae are normal. No scleral icterus. Neck supple.  Cardiovascular: Normal rate, regular rhythm.  Pulmonary/chest: Effort normal and breath sounds normal. No wheezing, rales or rhonchi. Abdominal: Soft, nondistended, tenderness to palpation in the epigastrium and left upper quadrant.  Carnett's sign equivocal.  No tenderness to palpation over the costal margin.  Bowel sounds active throughout. There are no masses palpable. No hepatomegaly. Extremities: no edema Neurological: Alert and oriented to person place and time. Skin: Skin is warm and dry. No rashes noted. Psychiatric: Normal mood and affect. Behavior is normal.  CBC    Component Value Date/Time   WBC 7.8 12/15/2023 1856   RBC 4.10 12/15/2023 1856   HGB 11.8 (L) 12/15/2023 1856   HCT 35.5 (L) 12/15/2023 1856   PLT 209 12/15/2023 1856   MCV 86.6 12/15/2023 1856   MCV 88.2 10/19/2012 1110   MCH 28.8 12/15/2023 1856   MCHC 33.2 12/15/2023 1856   RDW 13.9 12/15/2023 1856    LYMPHSABS 2.3 07/19/2016 1515   MONOABS 0.6 07/19/2016 1515   EOSABS 0.1 07/19/2016 1515   BASOSABS 0.0 07/19/2016 1515    CMP     Component Value Date/Time   NA 140 12/15/2023 1856   K 4.1 12/15/2023 1856   CL 102 12/15/2023 1856   CO2 27 12/15/2023 1856   GLUCOSE 93 12/15/2023 1856   BUN 22 12/15/2023 1856   CREATININE 0.99 12/15/2023 1856   CALCIUM 9.7 12/15/2023 1856   PROT 7.8 12/15/2023 1856   ALBUMIN 4.0 12/15/2023 1856   AST 20 12/15/2023 1856   ALT 10 12/15/2023 1856   ALKPHOS 69 12/15/2023 1856   BILITOT 0.2 12/15/2023 1856   GFRNONAA 56 (L) 12/15/2023 1856   GFRAA >60 09/10/2019 1428       Latest Ref Rng & Units 12/15/2023    6:56 PM 11/11/2021    4:28 PM 09/10/2020    1:32 PM  CBC EXTENDED  WBC 4.0 - 10.5 K/uL 7.8  10.5  6.5   RBC 3.87 - 5.11 MIL/uL 4.10  4.50  4.23   Hemoglobin 12.0 - 15.0 g/dL 88.1  87.3  87.8   HCT 36.0 - 46.0 % 35.5  38.9  36.0   Platelets 150 - 400 K/uL 209  189  195.0       ASSESSMENT AND PLAN:   83 year old female with abrupt onset  upper abdominal pain which started over 3 weeks ago and has not significantly improved.  Pain is mostly exacerbated by movement, activity, coughing and deep breaths.  No associated nausea or vomiting.  Pain not worsened with eating.  No changes in bowel habits. CT scan showed normal-appearing pancreas, multiple large hepatic cysts and no other abnormalities to explain her pain.  Lab evaluation notable for slightly elevated lipase. Low suspicion the patient has pancreatitis based on the very mild lipase elevation, normal-appearing pancreas on CT, and absence of other symptoms such as nausea/vomiting, anorexia. Given the size of her hepatic cysts, it is possible that these are symptomatic, however the presentation seems less typical for symptomatic liver cysts.  The largest cyst does involve the left lobe of the liver.  I do not have any prior CTs for comparison to know if the cysts have increased in size but  an ultrasound from 2023 showed multiple large cysts, with the largest estimated at 10.7 cm. Given the significant activity/positional component of her pain, it is possible this is more musculoskeletal.  A definitive Carnett's sign was not appreciated. For now, I recommend we exclude gastric ulcer with an upper endoscopy.  Will plan to start empiric PPI therapy.  If EGD normal and pain not improving, will refer to surgery to get their opinion on role of hepatic cysts and her symptoms.  In the meantime, recommended patient use Tylenol  during the day and the oxycodone  at night for pain. - Schedule expedited upper endoscopy (EGD) to rule out gastric ulcer or other stomach pathology. - Prescribe pantoprazole  40 mg once daily. - Advise acetaminophen  for pain management as needed. - Repeat lipase level. - Consider surgical consultation for large hepatic cysts if EGD is normal and pain persists.  Large hepatic cysts Large simple hepatic cysts noted on CT and ultrasound, not concerning for malignancy but may cause pain through mass effect. - Consider surgical consultation if pain persists after EGD and is not explained by other findings.  Colon cancer screening Patient's daughter requests screening colonoscopy, as the patient has never had one.  The patient was agreeable, if recommended.  If her symptoms resolved, we may want to consider a stool based test instead of a colonoscopy. -Tentatively schedule colonoscopy for October 6th for colon cancer screening.  If pain improves, we may change to a stool based testing for colon cancer.  Recording duration: 32 minutes      The details, risks (including bleeding, perforation, infection, missed lesions, medication reactions and possible hospitalization or surgery if complications occur), benefits, and alternatives to EGD with possible biopsy and possible dilation were discussed with the patient and she consents to proceed.    Leland Shellhammer E. Stacia, MD [facility]    No ref. provider found

## 2023-12-30 ENCOUNTER — Ambulatory Visit (AMBULATORY_SURGERY_CENTER): Admitting: Gastroenterology

## 2023-12-30 ENCOUNTER — Encounter: Payer: Self-pay | Admitting: Gastroenterology

## 2023-12-30 VITALS — BP 112/68 | HR 70 | Temp 98.1°F | Resp 19 | Ht 65.0 in | Wt 155.0 lb

## 2023-12-30 DIAGNOSIS — K2970 Gastritis, unspecified, without bleeding: Secondary | ICD-10-CM

## 2023-12-30 DIAGNOSIS — R1012 Left upper quadrant pain: Secondary | ICD-10-CM

## 2023-12-30 DIAGNOSIS — B9681 Helicobacter pylori [H. pylori] as the cause of diseases classified elsewhere: Secondary | ICD-10-CM

## 2023-12-30 DIAGNOSIS — K31A11 Gastric intestinal metaplasia without dysplasia, involving the antrum: Secondary | ICD-10-CM | POA: Diagnosis not present

## 2023-12-30 DIAGNOSIS — K449 Diaphragmatic hernia without obstruction or gangrene: Secondary | ICD-10-CM

## 2023-12-30 DIAGNOSIS — K2950 Unspecified chronic gastritis without bleeding: Secondary | ICD-10-CM | POA: Diagnosis not present

## 2023-12-30 DIAGNOSIS — K3189 Other diseases of stomach and duodenum: Secondary | ICD-10-CM

## 2023-12-30 DIAGNOSIS — K2941 Chronic atrophic gastritis with bleeding: Secondary | ICD-10-CM | POA: Diagnosis not present

## 2023-12-30 MED ORDER — SODIUM CHLORIDE 0.9 % IV SOLN: 500.0000 mL | Freq: Once | INTRAVENOUS | Status: DC

## 2023-12-30 NOTE — Op Note (Signed)
 [facility] Patient Name: Karen [name] Procedure Date: 12/30/2023 10:53 AM MRN: [id number] Endoscopist: Glendia E. Stacia , MD, [id number] Age: 83 Referring MD:  Date of Birth: 1940-09-12 Gender: Female Account #: [id number] Procedure:                Upper GI endoscopy Indications:              Abdominal pain in the left upper quadrant Medicines:                Monitored Anesthesia Care Procedure:                Pre-Anesthesia Assessment:                           - Prior to the procedure, a History and Physical                            was performed, and patient medications and                            allergies were reviewed. The patient's tolerance of                            previous anesthesia was also reviewed. The risks                            and benefits of the procedure and the sedation                            options and risks were discussed with the patient.                            All questions were answered, and informed consent                            was obtained. Prior Anticoagulants: The patient has                            taken no anticoagulant or antiplatelet agents. ASA                            Grade Assessment: II - A patient with mild systemic                            disease. After reviewing the risks and benefits,                            the patient was deemed in satisfactory condition to                            undergo the procedure.                           After obtaining informed consent, the endoscope was  passed under direct vision. Throughout the                            procedure, the patient's blood pressure, pulse, and                            oxygen saturations were monitored continuously. The                            GIF [id number] #[id number] was introduced through the                            mouth, and advanced to the second part of duodenum.                            The  upper GI endoscopy was accomplished without                            difficulty. The patient tolerated the procedure                            well. Scope In: Scope Out: Findings:                 The examined esophagus was normal.                           Diffuse mild inflammation characterized by                            erythema, friability and granularity was found in                            the gastric body. Biopsies were taken with a cold                            forceps for Helicobacter pylori testing. Estimated                            blood loss was minimal.                           Extrinsic compression on the stomach was found on                            the anterior wall of the gastric body.                           A 2 cm hiatal hernia was present.                           The exam of the stomach was otherwise normal.                            Biopsies were taken with a cold forceps in  the                            gastric antrum for Helicobacter pylori testing.                            Estimated blood loss was minimal.                           The examined duodenum was normal. Complications:            No immediate complications. Estimated Blood Loss:     Estimated blood loss was minimal. Impression:               - Normal esophagus.                           - Gastritis. Biopsied.                           - Mild extrinsic compression in the gastric body                            (anterior wall) likely related to large hepatic                            cyst .                           - 2 cm hiatal hernia.                           - Normal examined duodenum.                           - No obvious cause of upper abdominal pain Recommendation:           - Patient has a contact number available for                            emergencies. The signs and symptoms of potential                            delayed complications were discussed with the                             patient. Return to normal activities tomorrow.                            Written discharge instructions were provided to the                            patient.                           - Resume previous diet.                           -  Continue present medications.                           - Await pathology results. Tatsuo Musial E. Stacia, MD 12/30/2023 11:26:43 AM This report has been signed electronically.

## 2023-12-30 NOTE — Progress Notes (Signed)
 Pt's states no medical or surgical changes since previsit or office visit.

## 2023-12-30 NOTE — Progress Notes (Signed)
 Sedate, gd SR, tolerated procedure well, VSS, report to RN

## 2023-12-30 NOTE — Progress Notes (Signed)
 Called to room to assist during endoscopic procedure.  Patient ID and intended procedure confirmed with present staff. Received instructions for my participation in the procedure from the performing physician.

## 2023-12-30 NOTE — Patient Instructions (Signed)

## 2023-12-30 NOTE — Progress Notes (Signed)
 History and Physical Interval Note:  12/30/2023 10:58 AM  Karen [name]  has presented today for endoscopic procedure(s), with the diagnosis of  Encounter Diagnosis  Name Primary?   LUQ abdominal pain Yes  .  The various methods of evaluation and treatment have been discussed with the patient and/or family. After consideration of risks, benefits and other options for treatment, the patient has consented to  the endoscopic procedure(s).   The patient's history has been reviewed, patient examined, no change in status, stable for endoscopic procedure(s).  I have reviewed the patient's chart and labs.  Questions were answered to the patient's satisfaction.     Rodrigo Mcgranahan E. Stacia, MD [facility]

## 2024-01-02 ENCOUNTER — Telehealth: Payer: Self-pay

## 2024-01-02 NOTE — Telephone Encounter (Signed)
 Left message

## 2024-01-04 LAB — SURGICAL PATHOLOGY

## 2024-01-08 ENCOUNTER — Ambulatory Visit: Payer: Self-pay | Admitting: Gastroenterology

## 2024-01-08 DIAGNOSIS — K2970 Gastritis, unspecified, without bleeding: Secondary | ICD-10-CM

## 2024-01-08 NOTE — Progress Notes (Signed)
 Stasia,  The biopsies from your recent upper GI Endoscopy were notable for H. Pylori gastritis.  H. pylori is a common bacteria which can cause chronic symptoms of abdominal pain, nausea and bloating.  It can also cause iron deficiency anemia as well as increase the risk of stomach ulcers and stomach cancer.  We need to eradicate the bacteria.  Sometimes the bacteria is very difficult to eradicate, so it is  important to take the medications as directed. Please take the medications below:  1) Omeprazole  20 mg 2 times a day x 14 d 2) Pepto Bismol 2 tabs (262 mg each) 4 times a day x 14 d 3) Metronidazole  250 mg 4 times a day x 14 d 4) doxycycline  100 mg 2 times a day x 14 d  You can stop the pantoprazole   4 weeks after treatment completed, check H. Pylori stool antigen to confirm eradication (must be off acid suppression therapy for 2 weeks prior to specimen submission)  Dx: H. Pylori gastritis

## 2024-01-09 MED ORDER — BISMUTH SUBSALICYLATE 262 MG PO CHEW: 524.0000 mg | CHEWABLE_TABLET | Freq: Four times a day (QID) | ORAL | 0 refills | Status: AC

## 2024-01-09 MED ORDER — DOXYCYCLINE HYCLATE 100 MG PO TABS: 100.0000 mg | ORAL_TABLET | Freq: Two times a day (BID) | ORAL | 0 refills | Status: AC

## 2024-01-09 MED ORDER — METRONIDAZOLE 250 MG PO TABS: 250.0000 mg | ORAL_TABLET | Freq: Four times a day (QID) | ORAL | 0 refills | Status: AC

## 2024-01-09 MED ORDER — OMEPRAZOLE 20 MG PO CPDR: 20.0000 mg | DELAYED_RELEASE_CAPSULE | Freq: Two times a day (BID) | ORAL | 0 refills | Status: DC

## 2024-01-10 ENCOUNTER — Encounter: Payer: Self-pay | Admitting: Gastroenterology

## 2024-01-17 ENCOUNTER — Encounter: Admitting: Gastroenterology

## 2024-02-03 ENCOUNTER — Telehealth: Payer: Self-pay | Admitting: *Deleted

## 2024-02-03 NOTE — Telephone Encounter (Signed)
 Spoke to patient's daughter, Harlene. She states that her mother is still having reflux and LUQ abdominal pain despite having taken the H pylori antibiotic regimen given a few weeks back. She is wanting to know if Dr Stacia thinks surgical referral for large hepatic cysts would be beneficial now that she has had EGD and has taken H pylori medication without relief.   Please advise.

## 2024-02-06 NOTE — Telephone Encounter (Signed)
 Patient's daughter advised of Dr London response/recommendation.

## 2024-02-08 ENCOUNTER — Other Ambulatory Visit

## 2024-02-08 DIAGNOSIS — K2970 Gastritis, unspecified, without bleeding: Secondary | ICD-10-CM

## 2024-02-08 DIAGNOSIS — B9681 Helicobacter pylori [H. pylori] as the cause of diseases classified elsewhere: Secondary | ICD-10-CM

## 2024-02-12 ENCOUNTER — Other Ambulatory Visit: Payer: Self-pay

## 2024-02-12 ENCOUNTER — Emergency Department (HOSPITAL_COMMUNITY)

## 2024-02-12 ENCOUNTER — Inpatient Hospital Stay (HOSPITAL_COMMUNITY)
Admission: EM | Admit: 2024-02-12 | Discharge: 2024-02-17 | DRG: 442 | Disposition: A | Discharge status: Home / Self Care | Attending: Internal Medicine | Admitting: Internal Medicine

## 2024-02-12 ENCOUNTER — Encounter (HOSPITAL_COMMUNITY): Payer: Self-pay | Admitting: Internal Medicine

## 2024-02-12 DIAGNOSIS — Q446 Cystic disease of liver: Secondary | ICD-10-CM

## 2024-02-12 DIAGNOSIS — R1013 Epigastric pain: Secondary | ICD-10-CM | POA: Diagnosis not present

## 2024-02-12 DIAGNOSIS — G8929 Other chronic pain: Secondary | ICD-10-CM | POA: Diagnosis present

## 2024-02-12 DIAGNOSIS — Z9842 Cataract extraction status, left eye: Secondary | ICD-10-CM

## 2024-02-12 DIAGNOSIS — R1085 Abdominal pain of multiple sites: Secondary | ICD-10-CM | POA: Diagnosis present

## 2024-02-12 DIAGNOSIS — Z8249 Family history of ischemic heart disease and other diseases of the circulatory system: Secondary | ICD-10-CM | POA: Diagnosis not present

## 2024-02-12 DIAGNOSIS — Z83438 Family history of other disorder of lipoprotein metabolism and other lipidemia: Secondary | ICD-10-CM

## 2024-02-12 DIAGNOSIS — R1012 Left upper quadrant pain: Secondary | ICD-10-CM | POA: Diagnosis not present

## 2024-02-12 DIAGNOSIS — D509 Iron deficiency anemia, unspecified: Secondary | ICD-10-CM | POA: Diagnosis present

## 2024-02-12 DIAGNOSIS — Z66 Do not resuscitate: Secondary | ICD-10-CM | POA: Diagnosis present

## 2024-02-12 DIAGNOSIS — L659 Nonscarring hair loss, unspecified: Secondary | ICD-10-CM | POA: Diagnosis present

## 2024-02-12 DIAGNOSIS — M810 Age-related osteoporosis without current pathological fracture: Secondary | ICD-10-CM | POA: Diagnosis present

## 2024-02-12 DIAGNOSIS — F419 Anxiety disorder, unspecified: Secondary | ICD-10-CM | POA: Diagnosis present

## 2024-02-12 DIAGNOSIS — I5032 Chronic diastolic (congestive) heart failure: Secondary | ICD-10-CM | POA: Insufficient documentation

## 2024-02-12 DIAGNOSIS — Z9841 Cataract extraction status, right eye: Secondary | ICD-10-CM

## 2024-02-12 DIAGNOSIS — I10 Essential (primary) hypertension: Secondary | ICD-10-CM | POA: Diagnosis present

## 2024-02-12 DIAGNOSIS — B370 Candidal stomatitis: Secondary | ICD-10-CM | POA: Diagnosis present

## 2024-02-12 DIAGNOSIS — E876 Hypokalemia: Secondary | ICD-10-CM | POA: Diagnosis present

## 2024-02-12 DIAGNOSIS — Z888 Allergy status to other drugs, medicaments and biological substances status: Secondary | ICD-10-CM

## 2024-02-12 DIAGNOSIS — I110 Hypertensive heart disease with heart failure: Secondary | ICD-10-CM | POA: Insufficient documentation

## 2024-02-12 DIAGNOSIS — K7689 Other specified diseases of liver: Principal | ICD-10-CM | POA: Diagnosis present

## 2024-02-12 DIAGNOSIS — A048 Other specified bacterial intestinal infections: Secondary | ICD-10-CM | POA: Diagnosis present

## 2024-02-12 DIAGNOSIS — R109 Unspecified abdominal pain: Secondary | ICD-10-CM | POA: Diagnosis present

## 2024-02-12 HISTORY — DX: Chronic diastolic (congestive) heart failure: I50.32

## 2024-02-12 HISTORY — DX: Cystic disease of liver: Q44.6

## 2024-02-12 LAB — URINALYSIS, ROUTINE W REFLEX MICROSCOPIC
Bacteria, UA: NONE SEEN
Bilirubin Urine: NEGATIVE
Glucose, UA: NEGATIVE mg/dL
Hgb urine dipstick: NEGATIVE
Ketones, ur: NEGATIVE mg/dL
Leukocytes,Ua: NEGATIVE
Nitrite: NEGATIVE
Protein, ur: 30 mg/dL — AB
Specific Gravity, Urine: 1.014 (ref 1.005–1.030)
pH: 6 (ref 5.0–8.0)

## 2024-02-12 LAB — COMPREHENSIVE METABOLIC PANEL WITH GFR
ALT: 18 U/L (ref 0–44)
AST: 25 U/L (ref 15–41)
Albumin: 3.9 g/dL (ref 3.5–5.0)
Alkaline Phosphatase: 89 U/L (ref 38–126)
Anion gap: 11 (ref 5–15)
BUN: 18 mg/dL (ref 8–23)
CO2: 28 mmol/L (ref 22–32)
Calcium: 10 mg/dL (ref 8.9–10.3)
Chloride: 99 mmol/L (ref 98–111)
Creatinine, Ser: 0.98 mg/dL (ref 0.44–1.00)
GFR, Estimated: 56 mL/min — ABNORMAL LOW (ref >60)
Glucose, Bld: 110 mg/dL — ABNORMAL HIGH (ref 70–99)
Potassium: 3.2 mmol/L — ABNORMAL LOW (ref 3.5–5.1)
Sodium: 138 mmol/L (ref 135–145)
Total Bilirubin: 0.6 mg/dL (ref 0.0–1.2)
Total Protein: 7.8 g/dL (ref 6.5–8.1)

## 2024-02-12 LAB — CBC
HCT: 37.2 % (ref 36.0–46.0)
Hemoglobin: 11.7 g/dL — ABNORMAL LOW (ref 12.0–15.0)
MCH: 27.5 pg (ref 26.0–34.0)
MCHC: 31.5 g/dL (ref 30.0–36.0)
MCV: 87.5 fL (ref 80.0–100.0)
Platelets: 200 K/uL (ref 150–400)
RBC: 4.25 MIL/uL (ref 3.87–5.11)
RDW: 13.8 % (ref 11.5–15.5)
WBC: 7.8 K/uL (ref 4.0–10.5)
nRBC: 0 % (ref 0.0–0.2)

## 2024-02-12 LAB — LIPASE, BLOOD: Lipase: 49 U/L (ref 11–51)

## 2024-02-12 MED ORDER — POTASSIUM CHLORIDE CRYS ER 20 MEQ PO TBCR
40.0000 meq | EXTENDED_RELEASE_TABLET | Freq: Once | ORAL | Status: AC
  Administered 2024-02-12: 40 meq via ORAL
  Filled 2024-02-12: qty 2

## 2024-02-12 MED ORDER — HYALURONIDASE HUMAN 150 UNIT/ML IJ SOLN
150.0000 [IU] | Freq: Once | INTRAMUSCULAR | Status: DC
Start: 2024-02-12 — End: 2024-02-17
  Filled 2024-02-12: qty 1

## 2024-02-12 MED ORDER — ONDANSETRON HCL 4 MG/2ML IJ SOLN: 4.0000 mg | Freq: Four times a day (QID) | INTRAMUSCULAR | Status: DC | PRN

## 2024-02-12 MED ORDER — HYDROMORPHONE HCL 1 MG/ML IJ SOLN
0.5000 mg | Freq: Once | INTRAMUSCULAR | Status: AC
  Administered 2024-02-12: 0.5 mg via INTRAVENOUS
  Filled 2024-02-12: qty 1

## 2024-02-12 MED ORDER — ALUM & MAG HYDROXIDE-SIMETH 200-200-20 MG/5ML PO SUSP
30.0000 mL | Freq: Once | ORAL | Status: AC
  Administered 2024-02-12: 30 mL via ORAL
  Filled 2024-02-12: qty 30

## 2024-02-12 MED ORDER — BISACODYL 5 MG PO TBEC: 5.0000 mg | DELAYED_RELEASE_TABLET | Freq: Every day | ORAL | Status: DC | PRN

## 2024-02-12 MED ORDER — LIDOCAINE VISCOUS HCL 2 % MT SOLN
15.0000 mL | Freq: Once | OROMUCOSAL | Status: AC
  Administered 2024-02-12: 15 mL via ORAL
  Filled 2024-02-12: qty 15

## 2024-02-12 MED ORDER — POTASSIUM CHLORIDE 10 MEQ/100ML IV SOLN
10.0000 meq | INTRAVENOUS | Status: AC
  Administered 2024-02-12: 10 meq via INTRAVENOUS
  Filled 2024-02-12 (×2): qty 100

## 2024-02-12 MED ORDER — SODIUM CHLORIDE 0.9% FLUSH
3.0000 mL | Freq: Two times a day (BID) | INTRAVENOUS | Status: DC
Start: 2024-02-12 — End: 2024-02-17
  Administered 2024-02-13 – 2024-02-17 (×9): 3 mL via INTRAVENOUS

## 2024-02-12 MED ORDER — ONDANSETRON HCL 4 MG PO TABS: 4.0000 mg | ORAL_TABLET | Freq: Four times a day (QID) | ORAL | Status: DC | PRN

## 2024-02-12 MED ORDER — HYDROMORPHONE HCL 1 MG/ML IJ SOLN
1.0000 mg | INTRAMUSCULAR | Status: DC | PRN
  Administered 2024-02-13 – 2024-02-14 (×4): 1 mg via INTRAVENOUS
  Filled 2024-02-12 (×5): qty 1

## 2024-02-12 MED ORDER — IOHEXOL 300 MG/ML  SOLN
100.0000 mL | Freq: Once | INTRAMUSCULAR | Status: AC | PRN
  Administered 2024-02-12: 100 mL via INTRAVENOUS

## 2024-02-12 NOTE — ED Notes (Signed)
 Ham sandwich and sprite given to pt.

## 2024-02-12 NOTE — ED Notes (Signed)
 Pt went to CT-Went to give due meds

## 2024-02-12 NOTE — ED Provider Notes (Signed)
 [place] EMERGENCY DEPARTMENT AT [facility] Provider Note   CSN: [id number] Arrival date & time: 02/12/24  1113     Patient presents with: Abdominal Pain   Karen [name] is a 83 y.o. female with past medical history of H. pylori, GERD who presents the emergency department for evaluation of left-sided abdominal pain.  Patient reports her pain initially began approximately 2 months ago when she was first diagnosed with H. pylori.  Patient states she has been compliant with the antibiotic regimen to get rid of the infection.  However, she feels that the abdominal pain is becoming worse and is now spreading into her epigastrium.  She denies nausea, vomiting, diarrhea.  She does not have any chest pain or shortness of breath.  She did report some mild dizziness upon waking up this morning, but it is improved.  She denies any recent falls or LOC.    Abdominal Pain      Prior to Admission medications   Medication Sig Start Date End Date Taking? Authorizing Provider  cyclobenzaprine  (FLEXERIL ) 5 MG tablet Take 1 tablet (5 mg total) by mouth 2 (two) times daily as needed for muscle spasms. 09/05/22   Mayer, Jodi R, NP  ibuprofen  (ADVIL ) 800 MG tablet Take 1 tablet (800 mg total) by mouth every 8 (eight) hours as needed for moderate pain. 06/13/19   Alvia Corean CROME, FNP  losartan -hydrochlorothiazide (HYZAAR) 100-25 MG tablet TAKE 1 TABLET BY MOUTH DAILY 02/10/22   Thedora Garnette HERO, MD  meclizine  (ANTIVERT ) 12.5 MG tablet Take 1 tablet (12.5 mg total) by mouth 3 (three) times daily as needed for dizziness. 09/18/20   Cirigliano, Mary K, DO  Na Sulfate-K Sulfate-Mg Sulfate concentrate (SUPREP) 17.5-3.13-1.6 GM/177ML SOLN Take 1 kit by mouth once. 12/27/23   [provider]  omeprazole  (PRILOSEC) 20 MG capsule Take 1 capsule (20 mg total) by mouth 2 (two) times daily for 14 days. 01/09/24 01/23/24  Stacia Glendia BRAVO, MD  ondansetron  (ZOFRAN -ODT) 4 MG disintegrating tablet Take  1 tablet (4 mg total) by mouth every 8 (eight) hours as needed for nausea or vomiting. 12/15/23   Tegeler, Lonni PARAS, MD  oxyCODONE -acetaminophen  (PERCOCET/ROXICET) 5-325 MG tablet Take 1 tablet by mouth every 6 (six) hours as needed for severe pain (pain score 7-10). 12/15/23   Tegeler, Lonni PARAS, MD  SUTAB  [phone] MG TABS Take 24 tablets by mouth once. 12/28/23   [provider]    Allergies: Patient has no known allergies.    Review of Systems  Gastrointestinal:  Positive for abdominal pain.    Updated Vital Signs BP (!) 154/86   Pulse 68   Temp 98 F (36.7 C) (Oral)   Resp 16   Ht 5' 5 (1.651 m)   Wt 70.3 kg   SpO2 99%   BMI 25.79 kg/m   Physical Exam Vitals and nursing note reviewed.  Constitutional:      Appearance: Normal appearance.  HENT:     Head: Normocephalic and atraumatic.     Mouth/Throat:     Mouth: Mucous membranes are moist.  Eyes:     General: No scleral icterus.       Right eye: No discharge.        Left eye: No discharge.     Conjunctiva/sclera: Conjunctivae normal.  Cardiovascular:     Rate and Rhythm: Normal rate and regular rhythm.     Pulses: Normal pulses.  Pulmonary:     Effort: Pulmonary effort is normal.  Breath sounds: Normal breath sounds.  Abdominal:     General: There is no distension.     Tenderness: There is abdominal tenderness in the epigastric area, left upper quadrant and left lower quadrant.     Comments: Left-sided abdominal pain to palpation.  No distention.  Musculoskeletal:        General: No deformity.     Cervical back: Normal range of motion.  Skin:    General: Skin is warm and dry.     Capillary Refill: Capillary refill takes less than 2 seconds.  Neurological:     Mental Status: She is alert.     Motor: No weakness.  Psychiatric:        Mood and Affect: Mood normal.     (all labs ordered are listed, but only abnormal results are displayed) Labs Reviewed  COMPREHENSIVE METABOLIC PANEL  WITH GFR - Abnormal; Notable for the following components:      Result Value   Potassium 3.2 (*)    Glucose, Bld 110 (*)    GFR, Estimated 56 (*)    All other components within normal limits  CBC - Abnormal; Notable for the following components:   Hemoglobin 11.7 (*)    All other components within normal limits  URINALYSIS, ROUTINE W REFLEX MICROSCOPIC - Abnormal; Notable for the following components:   APPearance HAZY (*)    Protein, ur 30 (*)    All other components within normal limits  LIPASE, BLOOD    EKG: None  Radiology: No results found.   Procedures   Medications Ordered in the ED  HYDROmorphone (DILAUDID) injection 0.5 mg (0.5 mg Intravenous Given 02/12/24 1158)  alum & mag hydroxide-simeth (MAALOX/MYLANTA) 200-200-20 MG/5ML suspension 30 mL (30 mLs Oral Given 02/12/24 1341)    And  lidocaine (XYLOCAINE) 2 % viscous mouth solution 15 mL (15 mLs Oral Given 02/12/24 1343)  potassium chloride  SA (KLOR-CON  M) CR tablet 40 mEq (40 mEq Oral Given 02/12/24 1456)  iohexol  (OMNIPAQUE ) 300 MG/ML solution 100 mL (100 mLs Intravenous Contrast Given 02/12/24 1452)                                Medical Decision Making Amount and/or Complexity of Data Reviewed Labs: ordered. Radiology: ordered.  Risk OTC drugs. Prescription drug management.   This patient presents to the ED for concern of aminal pain, this involves an extensive number of treatment options, and is a complaint that carries with it a high risk of complications and morbidity.   Differential diagnosis includes: Mesenteric ischemia, pancreatitis, diverticulitis, UTI, IBS, gastritis, PUD, liver malignancy, liver cysts  Co morbidities:  Struve H. pylori  Social Determinants of Health:   none  Additional history:  Patient is seen by [facility] gastroenterology and had an endoscopy on 9/21.  She was diagnosed with H. pylori at that time.  Lab Tests:  I Ordered, and personally interpreted labs.  The  pertinent results include:    - Potassium: 3.2   Imaging Studies:  I ordered imaging studies including CT abdomen pelvis I independently visualized and interpreted imaging which showed multiple large liver cysts Radiologist interpretation pending at this time.  Cardiac Monitoring/ECG:  The patient was maintained on a cardiac monitor.  I personally viewed and interpreted the cardiac monitored which showed an underlying rhythm of: Normal sinus  Medicines ordered and prescription drug management:  I ordered medication including  Medications  HYDROmorphone (DILAUDID) injection 0.5 mg (  0.5 mg Intravenous Given 02/12/24 1158)  alum & mag hydroxide-simeth (MAALOX/MYLANTA) 200-200-20 MG/5ML suspension 30 mL (30 mLs Oral Given 02/12/24 1341)    And  lidocaine (XYLOCAINE) 2 % viscous mouth solution 15 mL (15 mLs Oral Given 02/12/24 1343)  potassium chloride  SA (KLOR-CON  M) CR tablet 40 mEq (40 mEq Oral Given 02/12/24 1456)  iohexol  (OMNIPAQUE ) 300 MG/ML solution 100 mL (100 mLs Intravenous Contrast Given 02/12/24 1452)   for pain control Reevaluation of the patient after these medicines showed that the patient improved I have reviewed the patients home medicines and have made adjustments as needed  Test Considered:   none  Critical Interventions:   none  Consultations Obtained: -None  Problem List / ED Course:     ICD-10-CM   1. Abdominal pain of multiple sites  R10.85       MDM: 83 year old female who presents emergency department for evaluation of abdominal pain.  Patient states abdominal pain has been present since mid September, when she was diagnosed with H. pylori.  She reports compliance with the H. pylori eradication treatment.  However, she states her pain has gotten worse, prompting her visit today.  She is describing left-sided abdominal pain going into the epigastrium.  Of note, patient has known multiple liver cysts that were present on her CT 2 months ago as well  as her endoscopy at the time of her H. pylori diagnosis.  Largest cyst measures about 12 cm.  Her CBC and CMP are unremarkable.  Urine is normal.  I ordered a CT abdomen pelvis to evaluate for additional pathology that could be causing her pain.  I also ordered IV pain medicine as well as a GI cocktail to see if that provides her with any relief.  On reassessment, patient continues to report abdominal pain.  I offered her an additional dose of pain medicine, and she declined.  At this time, CT scan has not yet been read by the radiologist.  However, there are obvious large liver cysts.  These were present on her CT scan approximately 2 months ago the largest measuring almost 12 cm.  I have given handoff to Ali, PA-C who is awaiting formal radiology read.  At that time, a decision will be made whether or not to consult GI or IR or send patient home for pain control.   Dispostion:  After consideration of the diagnostic results and the patients response to treatment, I feel that the patient would benefit from further information obtained from radiology.   Final diagnoses:  Abdominal pain of multiple sites    ED Discharge Orders     None          Torrence Marry GORMAN DEVONNA 02/12/24 1510    Francesca Elsie CROME, MD 02/12/24 1555

## 2024-02-12 NOTE — Consult Note (Signed)
 Reason for Consult: Abdominal pain Referring Physician: Dr. Franklyn Soundra [name] is an 83 y.o. female.  HPI: The patient is an 83 year old female who presents to the emergency department with upper abdominal pain that has been going on for at least 2 months.  The pain is fairly constant in nature.  She denies any fevers or chills.  She denies any nausea or vomiting.  Her appetite has been good.  She has had some loose bowel movements.  She is otherwise in good health for 83 years old.  She does not smoke.  She had a CT scan that does show some large simple cysts involving the liver  Past Medical History:  Diagnosis Date   Hypertension     Past Surgical History:  Procedure Laterality Date   CATARACT EXTRACTION, BILATERAL     EYE SURGERY     on her retina    Family History  Problem Relation Age of Onset   Hypertension Mother    Hyperlipidemia Mother    Hypertension Father    Colon cancer Neg Hx    Esophageal cancer Neg Hx    Rectal cancer Neg Hx    Stomach cancer Neg Hx     Social History:  reports that she has never smoked. She has never used smokeless tobacco. She reports that she does not drink alcohol and does not use drugs.  Allergies: No Known Allergies  Medications: I have reviewed the patient's current medications.  Results for orders placed or performed during the hospital encounter of 02/12/24 (from the past 48 hours)  Lipase, blood     Status: None   Collection Time: 02/12/24 12:00 PM  Result Value Ref Range   Lipase 49 11 - 51 U/L    Comment: Performed at [facility], [address] [place], [place] [address]  Comprehensive metabolic panel     Status: Abnormal   Collection Time: 02/12/24 12:00 PM  Result Value Ref Range   Sodium 138 135 - 145 mmol/L   Potassium 3.2 (L) 3.5 - 5.1 mmol/L   Chloride 99 98 - 111 mmol/L   CO2 28 22 - 32 mmol/L   Glucose, Bld 110 (H) 70 - 99 mg/dL    Comment: Glucose reference range applies only to samples  taken after fasting for at least 8 hours.   BUN 18 8 - 23 mg/dL   Creatinine, Ser 9.01 0.44 - 1.00 mg/dL   Calcium 89.9 8.9 - 89.6 mg/dL   Total Protein 7.8 6.5 - 8.1 g/dL   Albumin 3.9 3.5 - 5.0 g/dL   AST 25 15 - 41 U/L   ALT 18 0 - 44 U/L   Alkaline Phosphatase 89 38 - 126 U/L   Total Bilirubin 0.6 0.0 - 1.2 mg/dL   GFR, Estimated 56 (L) >60 mL/min    Comment: (NOTE) Calculated using the CKD-EPI Creatinine Equation (2021)    Anion gap 11 5 - 15    Comment: Performed at [facility], [address] [place], [place] [address]  CBC     Status: Abnormal   Collection Time: 02/12/24 12:00 PM  Result Value Ref Range   WBC 7.8 4.0 - 10.5 K/uL   RBC 4.25 3.87 - 5.11 MIL/uL   Hemoglobin 11.7 (L) 12.0 - 15.0 g/dL   HCT 62.7 63.9 - 53.9 %   MCV 87.5 80.0 - 100.0 fL   MCH 27.5 26.0 - 34.0 pg   MCHC 31.5 30.0 - 36.0 g/dL  RDW 13.8 11.5 - 15.5 %   Platelets 200 150 - 400 K/uL   nRBC 0.0 0.0 - 0.2 %    Comment: Performed at [facility], [address] [place], [place] [address]  Urinalysis, Routine w reflex microscopic -Urine, Clean Catch     Status: Abnormal   Collection Time: 02/12/24  2:14 PM  Result Value Ref Range   Color, Urine YELLOW YELLOW   APPearance HAZY (A) CLEAR   Specific Gravity, Urine 1.014 1.005 - 1.030   pH 6.0 5.0 - 8.0   Glucose, UA NEGATIVE NEGATIVE mg/dL   Hgb urine dipstick NEGATIVE NEGATIVE   Bilirubin Urine NEGATIVE NEGATIVE   Ketones, ur NEGATIVE NEGATIVE mg/dL   Protein, ur 30 (A) NEGATIVE mg/dL   Nitrite NEGATIVE NEGATIVE   Leukocytes,Ua NEGATIVE NEGATIVE   RBC / HPF 0-5 0 - 5 RBC/hpf   WBC, UA 0-5 0 - 5 WBC/hpf   Bacteria, UA NONE SEEN NONE SEEN   Squamous Epithelial / HPF 0-5 0 - 5 /HPF   Mucus PRESENT    Hyaline Casts, UA PRESENT     Comment: Performed at [facility], [address] [place], [place] [address]    CT ABDOMEN PELVIS W CONTRAST Result Date: 02/12/2024 CLINICAL DATA:   Acute abdominal pain. Patient reports continued and worsening left upper abdominal pain. EXAM: CT ABDOMEN AND PELVIS WITH CONTRAST TECHNIQUE: Multidetector CT imaging of the abdomen and pelvis was performed using the standard protocol following bolus administration of intravenous contrast. RADIATION DOSE REDUCTION: This exam was performed according to the departmental dose-optimization program which includes automated exposure control, adjustment of the mA and/or kV according to patient size and/or use of iterative reconstruction technique. CONTRAST:  OMNIPAQUE  IOHEXOL  300 MG/ML  SOLN COMPARISON:  12/15/2023 FINDINGS: Lower chest: Subsegmental atelectasis in the right lower lobe. No pleural effusion. Small hiatal hernia. Hepatobiliary: Numerous cysts in the liver of varying sizes. Largest cyst is within the left lobe of the liver and spans greater than 11 cm transverse and is mildly exophytic. No evidence of solid liver lesion. Partially distended gallbladder, no biliary dilatation. Pancreas: Fatty atrophy. No ductal dilatation or inflammation. No evidence of focal lesion. Spleen: Normal in size without focal abnormality. Adrenals/Urinary Tract: No adrenal nodule. No hydronephrosis or perinephric edema. Homogeneous renal enhancement with symmetric excretion on delayed phase imaging. Left renal cyst. No further follow-up imaging is recommended. Urinary bladder is completely decompressed. Stomach/Bowel: Small hiatal hernia. The stomach is nondistended. No bowel obstruction or inflammatory change. Sigmoid colonic diverticulosis without diverticulitis. Normal appendix. Vascular/Lymphatic: Aortic atherosclerosis. No aortic aneurysm. No acute vascular findings. No abdominopelvic adenopathy. Reproductive: Uterus and bilateral adnexa are unremarkable. Other: No free air, free fluid, or intra-abdominal fluid collection. Small fat containing umbilical hernia. Musculoskeletal: Levo scoliotic curvature of the spine.  Multilevel facet hypertrophy in the spine. There are no acute or suspicious osseous abnormalities. IMPRESSION: 1. No acute abnormality or explanation for abdominal pain. 2. Numerous cysts in the liver of varying sizes. Largest cyst is within the left lobe of the liver and spans greater than 11 cm transverse and is mildly exophytic. Capsular stretching could potentially be a source of left-sided pain. Overall, no change from prior exam. 3. Sigmoid colonic diverticulosis without diverticulitis. 4. Small hiatal hernia. Aortic Atherosclerosis (ICD10-I70.0). Electronically Signed   By: Andrea Gasman M.D.   On: 02/12/2024 15:27    Review of Systems  Constitutional: Negative.   HENT: Negative.    Eyes: Negative.  Respiratory: Negative.    Cardiovascular: Negative.   Gastrointestinal:  Positive for abdominal pain. Negative for nausea and vomiting.  Endocrine: Negative.   Genitourinary: Negative.   Musculoskeletal: Negative.   Skin: Negative.   Allergic/Immunologic: Negative.   Neurological: Negative.   Hematological: Negative.   Psychiatric/Behavioral: Negative.     Blood pressure 129/66, pulse 75, temperature 98.2 F (36.8 C), temperature source Oral, resp. rate 18, height 5' 5 (1.651 m), weight 70.3 kg, SpO2 98%. Physical Exam Vitals reviewed.  Constitutional:      General: She is not in acute distress.    Appearance: Normal appearance.  HENT:     Head: Normocephalic and atraumatic.     Right Ear: External ear normal.     Left Ear: External ear normal.     Nose: Nose normal.     Mouth/Throat:     Mouth: Mucous membranes are moist.     Pharynx: Oropharynx is clear.  Eyes:     Extraocular Movements: Extraocular movements intact.     Conjunctiva/sclera: Conjunctivae normal.     Pupils: Pupils are equal, round, and reactive to light.  Cardiovascular:     Rate and Rhythm: Normal rate and regular rhythm.     Pulses: Normal pulses.     Heart sounds: Normal heart sounds.  Pulmonary:      Effort: Pulmonary effort is normal. No respiratory distress.     Breath sounds: Normal breath sounds.  Abdominal:     General: Abdomen is flat.     Palpations: Abdomen is soft.     Comments: There is moderate upper abdominal tenderness.  There is no peritonitis.  Musculoskeletal:        General: No swelling or deformity. Normal range of motion.     Cervical back: Normal range of motion and neck supple.  Skin:    General: Skin is warm and dry.     Coloration: Skin is not jaundiced.  Neurological:     General: No focal deficit present.     Mental Status: She is alert and oriented to person, place, and time.  Psychiatric:        Mood and Affect: Mood normal.        Behavior: Behavior normal.     Assessment/Plan: The patient appears to have some large simple cysts involving the liver.  This certainly could cause her some upper abdominal discomfort.  Fortunately she does not appear to be sick.  At this point we will have her follow-up with our liver specialists in our clinic.  If she remains in the hospital we can reach out to them this week to get their opinion on management of the cyst.  She has no sign of obstruction and no sign of infection.  Karen [name] 02/12/2024, 6:05 PM

## 2024-02-12 NOTE — Progress Notes (Signed)
 Patient complaining of pain in her IV site, potassium IV is ongoing, stopped and contacted IV team for reinsertion,  warm compress applied. Pharmacist informed and Hylenex was prescribed.  After new IV inserted, IV potassium continues patient still complaining of pain, changed the rate to 70 ml//hr after 5 mins patient still complaining, changed to 67ml/hr after 5 mins patient's son called and states that her mom IV site is still hurting. warm compress given, and the patient refuses IV potassium MD informed.  Patient refused Hylenex, IV site checked with charge nurse, patient states that the previous IV site is fine and no pain at all. No redness noted.

## 2024-02-12 NOTE — ED Provider Notes (Signed)
 See previous note for full details.  Patient received in signout. Physical Exam  BP (!) 154/86   Pulse 68   Temp 98 F (36.7 C) (Oral)   Resp 16   Ht 5' 5 (1.651 m)   Wt 70.3 kg   SpO2 99%   BMI 25.79 kg/m     Procedures  Procedures  ED Course / MDM   Clinical Course as of 02/12/24 1646  Sun Feb 12, 2024  1527 Patient received in signout.  History of large hepatic cysts.  This is a known issue.  She follows with gastroenterology.  They are planning.  Finally resolved but she would get a referral to general surgeon to have these removed.  She states the pain is unbearable.  Does not want additional pain medication because pain medication does not help much. [AA]  [id number] Spoke with GI Dr. Legrand.  They recommend admission for pain control and consulting with surgery for surgery team to weigh in from their standpoint. Spoke to surgery Dr. Curvin.  They will evaluate patient and.  No acute intervention. [AA]  1644 Discussed with hospitalist.  They will evaluate patient for admission. [AA]    Clinical Course User Index [AA] Hildegard Loge, PA-C   Medical Decision Making Amount and/or Complexity of Data Reviewed Labs: ordered. Radiology: ordered.  Risk OTC drugs. Prescription drug management.   CT without any concerning findings outside of the large hepatic cyst that are known.  No significant change. Patient discussed with hospitalist.  They will evaluate patient for admission.  Also discussed with consultants.  See their recommendations above and clinical course.       Hildegard Loge, PA-C 02/12/24 1647    Franklyn Sid SAILOR, MD 02/12/24 2031

## 2024-02-12 NOTE — ED Triage Notes (Signed)
 Pt BIBA from home for left upper abd pain x6 weeks. Saw GI in sept, dx with h pylori, getting tx. Pain continuing to get worse. New dizziness today. Tenderness, some distention. No rigidity. Family concerned that liver cysts are causing problems. Denies NVD.   138/72 Hr 92 97% ra 97 temp

## 2024-02-12 NOTE — H&P (Signed)
 History and Physical    Patient: Karen [name] FMW:[id number] DOB: [birth year] DOA: 02/12/2024 DOS: the patient was seen and examined on 02/12/2024 PCP: Pcp, No  Patient coming from: Home  Chief Complaint:  Chief Complaint  Patient presents with   Abdominal Pain   HPI: Karen [name] is a 83 y.o. female with medical history significant for 2 months of severe midepigastric area abdominal pain.  The patient does see [facility] as an outpatient.  She did have an endoscopy and was found to have H. pylori for which she completed treatment 2 weeks ago.  She has had a CT scan in the past which showed a large liver cyst.  She is in the process of that being evaluated.  The family tells me she was going to have a referral to general surgery to have the cyst removed.  In the meantime the patient has 10 out of 10 pain essentially all the time.  She says it is constant.  She does not have nausea or vomiting or diarrhea.  She is able to eat and has normal bowel movements but the pain is unrelenting.  The dizziness she experienced today was new however.  The patient was home alone when she talked to the family and described her dizziness this morning.  EMS was called. In the emergency department the patient did have a CT scan of her abdomen and pelvis which revealed numerous cysts of varying sizes.  The largest is left centimeters.  The patient will be admitted to the hospitalist service for pain control, potassium replacement, and possible IV fluids.  She was found to have a low potassium and she received some IV fluids.    Review of Systems: As mentioned in the history of present illness. All other systems reviewed and are negative. Past Medical History:  Diagnosis Date   Hypertension    Past Surgical History:  Procedure Laterality Date   CATARACT EXTRACTION, BILATERAL     EYE SURGERY     on her retina   Social History:  reports that she has never smoked. She has never used smokeless tobacco. She  reports that she does not drink alcohol and does not use drugs.  No Known Allergies  Family History  Problem Relation Age of Onset   Hypertension Mother    Hyperlipidemia Mother    Hypertension Father    Colon cancer Neg Hx    Esophageal cancer Neg Hx    Rectal cancer Neg Hx    Stomach cancer Neg Hx     Prior to Admission medications   Medication Sig Start Date End Date Taking? Authorizing Provider  cyclobenzaprine  (FLEXERIL ) 5 MG tablet Take 1 tablet (5 mg total) by mouth 2 (two) times daily as needed for muscle spasms. 09/05/22   Mayer, Jodi R, NP  ibuprofen  (ADVIL ) 800 MG tablet Take 1 tablet (800 mg total) by mouth every 8 (eight) hours as needed for moderate pain. 06/13/19   Alvia Corean CROME, FNP  losartan -hydrochlorothiazide (HYZAAR) 100-25 MG tablet TAKE 1 TABLET BY MOUTH DAILY 02/10/22   Thedora Garnette HERO, MD  meclizine  (ANTIVERT ) 12.5 MG tablet Take 1 tablet (12.5 mg total) by mouth 3 (three) times daily as needed for dizziness. 09/18/20   Cirigliano, Mary K, DO  Na Sulfate-K Sulfate-Mg Sulfate concentrate (SUPREP) 17.5-3.13-1.6 GM/177ML SOLN Take 1 kit by mouth once. 12/27/23   [provider]  omeprazole  (PRILOSEC) 20 MG capsule Take 1 capsule (20 mg total) by mouth 2 (two) times daily for 14  days. 01/09/24 01/23/24  Stacia Glendia BRAVO, MD  ondansetron  (ZOFRAN -ODT) 4 MG disintegrating tablet Take 1 tablet (4 mg total) by mouth every 8 (eight) hours as needed for nausea or vomiting. 12/15/23   Tegeler, Lonni PARAS, MD  oxyCODONE -acetaminophen  (PERCOCET/ROXICET) 5-325 MG tablet Take 1 tablet by mouth every 6 (six) hours as needed for severe pain (pain score 7-10). 12/15/23   Tegeler, Lonni PARAS, MD  SUTAB  [phone] MG TABS Take 24 tablets by mouth once. 12/28/23   [provider]    Physical Exam: Vitals:   02/12/24 1400 02/12/24 1415 02/12/24 1747 02/12/24 1753  BP: (!) 145/72 (!) 154/86  129/66  Pulse: 64 68 86 75  Resp:  16 18 18   Temp:  98 F (36.7  C)  98.2 F (36.8 C)  TempSrc:  Oral  Oral  SpO2: 99% 99% 98% 98%  Weight:      Height:       Physical Exam:  General: No acute distress, well developed, well nourished HEENT: Normocephalic, atraumatic, PERRL Cardiovascular: Normal rate and rhythm. Distal pulses intact. Pulmonary: Normal pulmonary effort, normal breath sounds Gastrointestinal: Nondistended abdomen, soft, most tender in midepigastric area, it is distended there also, mild tenderness over to the left upper and lower quads also. Not tender in ruq. normoactive bowel sounds, organomegaly Musculoskeletal:Normal ROM, no lower ext edema Lymphadenopathy: No cervical LAD. Skin: Skin is warm and dry. Neuro: No focal deficits noted, AAOx3. PSYCH: Attentive and cooperative  Data Reviewed:  Results for orders placed or performed during the hospital encounter of 02/12/24 (from the past 24 hours)  Lipase, blood     Status: None   Collection Time: 02/12/24 12:00 PM  Result Value Ref Range   Lipase 49 11 - 51 U/L  Comprehensive metabolic panel     Status: Abnormal   Collection Time: 02/12/24 12:00 PM  Result Value Ref Range   Sodium 138 135 - 145 mmol/L   Potassium 3.2 (L) 3.5 - 5.1 mmol/L   Chloride 99 98 - 111 mmol/L   CO2 28 22 - 32 mmol/L   Glucose, Bld 110 (H) 70 - 99 mg/dL   BUN 18 8 - 23 mg/dL   Creatinine, Ser 9.01 0.44 - 1.00 mg/dL   Calcium 89.9 8.9 - 89.6 mg/dL   Total Protein 7.8 6.5 - 8.1 g/dL   Albumin 3.9 3.5 - 5.0 g/dL   AST 25 15 - 41 U/L   ALT 18 0 - 44 U/L   Alkaline Phosphatase 89 38 - 126 U/L   Total Bilirubin 0.6 0.0 - 1.2 mg/dL   GFR, Estimated 56 (L) >60 mL/min   Anion gap 11 5 - 15  CBC     Status: Abnormal   Collection Time: 02/12/24 12:00 PM  Result Value Ref Range   WBC 7.8 4.0 - 10.5 K/uL   RBC 4.25 3.87 - 5.11 MIL/uL   Hemoglobin 11.7 (L) 12.0 - 15.0 g/dL   HCT 62.7 63.9 - 53.9 %   MCV 87.5 80.0 - 100.0 fL   MCH 27.5 26.0 - 34.0 pg   MCHC 31.5 30.0 - 36.0 g/dL   RDW 86.1 88.4 -  84.4 %   Platelets 200 150 - 400 K/uL   nRBC 0.0 0.0 - 0.2 %  Urinalysis, Routine w reflex microscopic -Urine, Clean Catch     Status: Abnormal   Collection Time: 02/12/24  2:14 PM  Result Value Ref Range   Color, Urine YELLOW YELLOW   APPearance HAZY (A) CLEAR  Specific Gravity, Urine 1.014 1.005 - 1.030   pH 6.0 5.0 - 8.0   Glucose, UA NEGATIVE NEGATIVE mg/dL   Hgb urine dipstick NEGATIVE NEGATIVE   Bilirubin Urine NEGATIVE NEGATIVE   Ketones, ur NEGATIVE NEGATIVE mg/dL   Protein, ur 30 (A) NEGATIVE mg/dL   Nitrite NEGATIVE NEGATIVE   Leukocytes,Ua NEGATIVE NEGATIVE   RBC / HPF 0-5 0 - 5 RBC/hpf   WBC, UA 0-5 0 - 5 WBC/hpf   Bacteria, UA NONE SEEN NONE SEEN   Squamous Epithelial / HPF 0-5 0 - 5 /HPF   Mucus PRESENT    Hyaline Casts, UA PRESENT      Assessment and Plan: Abdominal pain/ 11cm liver cyst -[facility] and general surgery consulted - Pain control has been inadequate so far.  She rates her pain 9 out of 10 after receiving .5mg  of Dilaudid. - Will increase the dose. - Discontinue Percocet at discharge and switch over to oxycodone .  2. Hypokalemia - Replace  3.  Dizziness - reevaluate in a.m.  4. Htn - Hold diuretic  5. Recent H. Pylori diagnosis -he has completed her treatment and is scheduled for a repeat stool study soon.  He needs to be off PPIs.   Advance Care Planning:   Code Status: Not on file the patient names her daughter as her surrogate decision maker and she wants to be DNR.  She is Jehovah's Witness no blood products either  Consults: [facility], general surgery  Family Communication: The patient's son was at bedside  Severity of Illness: The appropriate patient status for this patient is INPATIENT. Inpatient status is judged to be reasonable and necessary in order to provide the required intensity of service to ensure the patient's safety. The patient's presenting symptoms, physical exam findings, and initial radiographic and laboratory data in the  context of their chronic comorbidities is felt to place them at high risk for further clinical deterioration. Furthermore, it is not anticipated that the patient will be medically stable for discharge from the hospital within 2 midnights of admission.   * I certify that at the point of admission it is my clinical judgment that the patient will require inpatient hospital care spanning beyond 2 midnights from the point of admission due to high intensity of service, high risk for further deterioration and high frequency of surveillance required.*  Author: ARTHEA CHILD, MD 02/12/2024 7:01 PM  For on call review [email or web address].

## 2024-02-13 DIAGNOSIS — R1013 Epigastric pain: Secondary | ICD-10-CM | POA: Diagnosis not present

## 2024-02-13 DIAGNOSIS — Q446 Cystic disease of liver: Secondary | ICD-10-CM | POA: Diagnosis not present

## 2024-02-13 LAB — COMPREHENSIVE METABOLIC PANEL WITH GFR
ALT: 16 U/L (ref 0–44)
AST: 25 U/L (ref 15–41)
Albumin: 3.5 g/dL (ref 3.5–5.0)
Alkaline Phosphatase: 88 U/L (ref 38–126)
Anion gap: 9 (ref 5–15)
BUN: 26 mg/dL — ABNORMAL HIGH (ref 8–23)
CO2: 27 mmol/L (ref 22–32)
Calcium: 9.3 mg/dL (ref 8.9–10.3)
Chloride: 103 mmol/L (ref 98–111)
Creatinine, Ser: 1.04 mg/dL — ABNORMAL HIGH (ref 0.44–1.00)
GFR, Estimated: 53 mL/min — ABNORMAL LOW (ref >60)
Glucose, Bld: 105 mg/dL — ABNORMAL HIGH (ref 70–99)
Potassium: 3.8 mmol/L (ref 3.5–5.1)
Sodium: 138 mmol/L (ref 135–145)
Total Bilirubin: 0.5 mg/dL (ref 0.0–1.2)
Total Protein: 7.1 g/dL (ref 6.5–8.1)

## 2024-02-13 LAB — CBC
HCT: 36 % (ref 36.0–46.0)
Hemoglobin: 11 g/dL — ABNORMAL LOW (ref 12.0–15.0)
MCH: 27 pg (ref 26.0–34.0)
MCHC: 30.6 g/dL (ref 30.0–36.0)
MCV: 88.2 fL (ref 80.0–100.0)
Platelets: 201 K/uL (ref 150–400)
RBC: 4.08 MIL/uL (ref 3.87–5.11)
RDW: 14.3 % (ref 11.5–15.5)
WBC: 8 K/uL (ref 4.0–10.5)
nRBC: 0 % (ref 0.0–0.2)

## 2024-02-13 LAB — PROTIME-INR
INR: 1.1 (ref 0.8–1.2)
Prothrombin Time: 14.6 s (ref 11.4–15.2)

## 2024-02-13 LAB — MAGNESIUM: Magnesium: 2.2 mg/dL (ref 1.7–2.4)

## 2024-02-13 MED ORDER — MECLIZINE HCL 25 MG PO TABS: 12.5000 mg | ORAL_TABLET | Freq: Three times a day (TID) | ORAL | Status: DC | PRN

## 2024-02-13 NOTE — Progress Notes (Signed)
 General Surgery Follow Up Note  Subjective:    Overnight Issues:   Objective:  Vital signs for last 24 hours: Temp:  [98 F (36.7 C)-98.6 F (37 C)] 98 F (36.7 C) (10/27 0631) Pulse Rate:  [64-86] 80 (10/27 0631) Resp:  [15-18] 15 (10/27 0631) BP: (114-154)/(46-86) 114/46 (10/27 0631) SpO2:  [93 %-99 %] 93 % (10/27 0631)  Hemodynamic parameters for last 24 hours:    Intake/Output from previous day: 10/26 0701 - 10/27 0700 In: 393.4 [P.O.:360; IV Piggyback:33.4] Out: -   Intake/Output this shift: No intake/output data recorded.  Vent settings for last 24 hours:    Physical Exam:  Gen: comfortable, no distress Neuro: follows commands, alert, communicative HEENT: PERRL Neck: supple CV: RRR Pulm: unlabored breathing on RA Abd: soft, NT   GU: urine clear and yellow, +spontaneous void Extr: wwp, no edema  Results for orders placed or performed during the hospital encounter of 02/12/24 (from the past 24 hours)  Urinalysis, Routine w reflex microscopic -Urine, Clean Catch     Status: Abnormal   Collection Time: 02/12/24  2:14 PM  Result Value Ref Range   Color, Urine YELLOW YELLOW   APPearance HAZY (A) CLEAR   Specific Gravity, Urine 1.014 1.005 - 1.030   pH 6.0 5.0 - 8.0   Glucose, UA NEGATIVE NEGATIVE mg/dL   Hgb urine dipstick NEGATIVE NEGATIVE   Bilirubin Urine NEGATIVE NEGATIVE   Ketones, ur NEGATIVE NEGATIVE mg/dL   Protein, ur 30 (A) NEGATIVE mg/dL   Nitrite NEGATIVE NEGATIVE   Leukocytes,Ua NEGATIVE NEGATIVE   RBC / HPF 0-5 0 - 5 RBC/hpf   WBC, UA 0-5 0 - 5 WBC/hpf   Bacteria, UA NONE SEEN NONE SEEN   Squamous Epithelial / HPF 0-5 0 - 5 /HPF   Mucus PRESENT    Hyaline Casts, UA PRESENT   Comprehensive metabolic panel     Status: Abnormal   Collection Time: 02/13/24  4:51 AM  Result Value Ref Range   Sodium 138 135 - 145 mmol/L   Potassium 3.8 3.5 - 5.1 mmol/L   Chloride 103 98 - 111 mmol/L   CO2 27 22 - 32 mmol/L   Glucose, Bld 105 (H) 70 - 99  mg/dL   BUN 26 (H) 8 - 23 mg/dL   Creatinine, Ser 8.95 (H) 0.44 - 1.00 mg/dL   Calcium 9.3 8.9 - 89.6 mg/dL   Total Protein 7.1 6.5 - 8.1 g/dL   Albumin 3.5 3.5 - 5.0 g/dL   AST 25 15 - 41 U/L   ALT 16 0 - 44 U/L   Alkaline Phosphatase 88 38 - 126 U/L   Total Bilirubin 0.5 0.0 - 1.2 mg/dL   GFR, Estimated 53 (L) >60 mL/min   Anion gap 9 5 - 15  CBC     Status: Abnormal   Collection Time: 02/13/24  4:51 AM  Result Value Ref Range   WBC 8.0 4.0 - 10.5 K/uL   RBC 4.08 3.87 - 5.11 MIL/uL   Hemoglobin 11.0 (L) 12.0 - 15.0 g/dL   HCT 63.9 63.9 - 53.9 %   MCV 88.2 80.0 - 100.0 fL   MCH 27.0 26.0 - 34.0 pg   MCHC 30.6 30.0 - 36.0 g/dL   RDW 85.6 88.4 - 84.4 %   Platelets 201 150 - 400 K/uL   nRBC 0.0 0.0 - 0.2 %  Magnesium     Status: None   Collection Time: 02/13/24  4:51 AM  Result Value Ref  Range   Magnesium 2.2 1.7 - 2.4 mg/dL    Assessment & Plan: Present on Admission:  Alopecia  Osteoporosis  Abdominal pain  H. pylori infection    LOS: 1 day   Additional comments:I reviewed the patient's new clinical lab test results.   and I reviewed the patients new imaging test results.    Multiple large, symptomatic liver cysts - case d/w IR for possible aspiration +/- sclerotherapy. Dr. Hughes has reviewed the case and believes this would be a reasonable consideration. Scheduling planned for this week, either Tues, Wed, or Thurs pending IR availability. I have requested cultures and cytology be sent of the fluid. Patient denies any h/o hereditary PCKD/PCLD, but consideration should be given to working up this diagnosis. Daughter requesting stool H pylori as this was supposed to be performed today as outpatient per GI recs after treatment course for H pylori as diagnosed on EGD. Surgical team to sign off.  Dizziness - different from baseline per patient, workup per primary team.  FEN - regular diet, NPO per IR pending procedure DVT - SCDs, LMWH Dispo - med-surg    Karen GEANNIE Hanger, MD Trauma & General Surgery Please use [email or web address] to contact on call provider  02/13/2024  *Care during the described time interval was provided by me. I have reviewed this patient's available data, including medical history, events of note, physical examination and test results as part of my evaluation.

## 2024-02-13 NOTE — Plan of Care (Signed)

## 2024-02-13 NOTE — Progress Notes (Signed)
 [facility] PROGRESS NOTE   Karen [name] FMW:[id number] DOB: 12/27/1940 DOA: 02/12/2024  PCP: Pcp, No  Brief History: 83 y.o. female with medical history significant for 2 months of severe midepigastric area abdominal pain.  The patient does see [facility] as an outpatient.  She did have an endoscopy and was found to have H. pylori for which she completed treatment 2 weeks ago.  She has had a CT scan in the past which showed a large liver cyst.  She is in the process of that being evaluated.  The family mentioned that she was going to have a referral to general surgery to have the cyst removed.  In the meantime the patient has 10 out of 10 pain essentially all the time.  She also experienced some dizziness on the day of admission.  She was hospitalized for further management.     Consultants: Gen Surgery.  Gastroenterology  Procedures: None yet    Subjective/Interval History: Patient complains of 10 out of 10 pain in the upper abdomen.  Some nausea but no vomiting.  Her son is at the bedside.    Assessment/Plan:  Acute on chronic abdominal pain Symptoms have been ongoing for 2-1/2 months.   She has been seen by gastroenterology for her symptoms in the outpatient setting.  She underwent upper endoscopy about 2 weeks ago and was found to have H. pylori.  She is completed treatment for the same. She has had abdominal imaging studies.  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  This could be the reason for her symptoms.  It appears the plan was for referral to general surgery in the outpatient setting but she presented to the hospital due to significant symptoms. General surgery and gastroenterology have been consulted. LFTs are noted to be normal. Pain control.  Hypokalemia Supplemented.  Magnesium is 2.2.  Normocytic anemia Stable.  No evidence of overt blood loss.  Anemia panel.  Essential hypertension Antihypertensives currently on hold.  Patient was experiencing some  lightheadedness.  PT and OT eval.  DVT Prophylaxis: SCDs Code Status: DNR Family Communication: Discussed with patient's son who was at the bedside Disposition Plan: Hopefully home when cleared for discharge  Status is: Inpatient Remains inpatient appropriate because: Acute on chronic abdominal pain      Medications: Scheduled:  hyaluronidase Human  150 Units Subcutaneous Once   sodium chloride  flush  3 mL Intravenous Q12H   Continuous: EMW:apdjrnibo, HYDROmorphone (DILAUDID) injection, ondansetron  **OR** ondansetron  (ZOFRAN ) IV  Antibiotics: Anti-infectives (From admission, onward)    None       Objective:  Vital Signs  Vitals:   02/12/24 1930 02/12/24 2038 02/13/24 0200 02/13/24 0631  BP: (!) 140/61 (!) 144/77 (!) 140/65 (!) 114/46  Pulse: 78 80 80 80  Resp:  18 16 15   Temp:  98.6 F (37 C) 98 F (36.7 C) 98 F (36.7 C)  TempSrc:  Oral Oral Oral  SpO2: 96% 97% 97% 93%  Weight:      Height:        Intake/Output Summary (Last 24 hours) at 02/13/2024 1022 Last data filed at 02/13/2024 0200 Gross per 24 hour  Intake 393.4 ml  Output --  Net 393.4 ml   Filed Weights   02/12/24 1122  Weight: 70.3 kg    General appearance: Awake alert.  In no distress Resp: Clear to auscultation bilaterally.  Normal effort Cardio: S1-S2 is normal regular.  No S3-S4.  No rubs murmurs or bruit GI: Abdomen is soft.  Tender in the epigastrium in the left upper quadrant.  Not so much tender in the right upper quadrant. Extremities: No edema.  Full range of motion of lower extremities. Neurologic: Alert and oriented x3.  No focal neurological deficits.    Lab Results:  Data Reviewed: I have personally reviewed following labs and reports of the imaging studies  CBC: Recent Labs  Lab 02/12/24 1200 02/13/24 0451  WBC 7.8 8.0  HGB 11.7* 11.0*  HCT 37.2 36.0  MCV 87.5 88.2  PLT 200 201    Basic Metabolic Panel: Recent Labs  Lab 02/12/24 1200 02/13/24 0451  NA  138 138  K 3.2* 3.8  CL 99 103  CO2 28 27  GLUCOSE 110* 105*  BUN 18 26*  CREATININE 0.98 1.04*  CALCIUM 10.0 9.3  MG  --  2.2    GFR: Estimated Creatinine Clearance: 39.6 mL/min (A) (by C-G formula based on SCr of 1.04 mg/dL (H)).  Liver Function Tests: Recent Labs  Lab 02/12/24 1200 02/13/24 0451  AST 25 25  ALT 18 16  ALKPHOS 89 88  BILITOT 0.6 0.5  PROT 7.8 7.1  ALBUMIN 3.9 3.5    Recent Labs  Lab 02/12/24 1200  LIPASE 49    Radiology Studies: CT ABDOMEN PELVIS W CONTRAST Result Date: 02/12/2024 CLINICAL DATA:  Acute abdominal pain. Patient reports continued and worsening left upper abdominal pain. EXAM: CT ABDOMEN AND PELVIS WITH CONTRAST TECHNIQUE: Multidetector CT imaging of the abdomen and pelvis was performed using the standard protocol following bolus administration of intravenous contrast. RADIATION DOSE REDUCTION: This exam was performed according to the departmental dose-optimization program which includes automated exposure control, adjustment of the mA and/or kV according to patient size and/or use of iterative reconstruction technique. CONTRAST:  OMNIPAQUE  IOHEXOL  300 MG/ML  SOLN COMPARISON:  12/15/2023 FINDINGS: Lower chest: Subsegmental atelectasis in the right lower lobe. No pleural effusion. Small hiatal hernia. Hepatobiliary: Numerous cysts in the liver of varying sizes. Largest cyst is within the left lobe of the liver and spans greater than 11 cm transverse and is mildly exophytic. No evidence of solid liver lesion. Partially distended gallbladder, no biliary dilatation. Pancreas: Fatty atrophy. No ductal dilatation or inflammation. No evidence of focal lesion. Spleen: Normal in size without focal abnormality. Adrenals/Urinary Tract: No adrenal nodule. No hydronephrosis or perinephric edema. Homogeneous renal enhancement with symmetric excretion on delayed phase imaging. Left renal cyst. No further follow-up imaging is recommended. Urinary bladder is  completely decompressed. Stomach/Bowel: Small hiatal hernia. The stomach is nondistended. No bowel obstruction or inflammatory change. Sigmoid colonic diverticulosis without diverticulitis. Normal appendix. Vascular/Lymphatic: Aortic atherosclerosis. No aortic aneurysm. No acute vascular findings. No abdominopelvic adenopathy. Reproductive: Uterus and bilateral adnexa are unremarkable. Other: No free air, free fluid, or intra-abdominal fluid collection. Small fat containing umbilical hernia. Musculoskeletal: Levo scoliotic curvature of the spine. Multilevel facet hypertrophy in the spine. There are no acute or suspicious osseous abnormalities. IMPRESSION: 1. No acute abnormality or explanation for abdominal pain. 2. Numerous cysts in the liver of varying sizes. Largest cyst is within the left lobe of the liver and spans greater than 11 cm transverse and is mildly exophytic. Capsular stretching could potentially be a source of left-sided pain. Overall, no change from prior exam. 3. Sigmoid colonic diverticulosis without diverticulitis. 4. Small hiatal hernia. Aortic Atherosclerosis (ICD10-I70.0). Electronically Signed   By: Andrea Gasman M.D.   On: 02/12/2024 15:27       LOS: 1 day   Karen [name]  [facility] on [email or web address]  02/13/2024, 10:22 AM

## 2024-02-13 NOTE — Progress Notes (Signed)
 Mobility Specialist - Progress Note   02/13/24 1440  Mobility  Activity Ambulated with assistance  Level of Assistance Contact guard assist, steadying assist  Assistive Device Other (Comment) (HHA)  Distance Ambulated (ft) 200 ft  Range of Motion/Exercises Active  Activity Response Tolerated well  Mobility Referral Yes  Mobility visit 1 Mobility  Mobility Specialist Start Time (ACUTE ONLY) 1430  Mobility Specialist Stop Time (ACUTE ONLY) 1440  Mobility Specialist Time Calculation (min) (ACUTE ONLY) 10 min   Pt was found in bed and agreeable to mobilize. C/o abdominal pain. At EOS returned to recliner chair with all needs met. Call bell in reach and family in room.   Erminio Leos,  Mobility Specialist Can be reached via Secure Chat

## 2024-02-14 ENCOUNTER — Inpatient Hospital Stay (HOSPITAL_COMMUNITY)

## 2024-02-14 DIAGNOSIS — Q446 Cystic disease of liver: Secondary | ICD-10-CM | POA: Diagnosis not present

## 2024-02-14 DIAGNOSIS — R1013 Epigastric pain: Secondary | ICD-10-CM | POA: Diagnosis not present

## 2024-02-14 LAB — COMPREHENSIVE METABOLIC PANEL WITH GFR
ALT: 18 U/L (ref 0–44)
AST: 26 U/L (ref 15–41)
Albumin: 3.4 g/dL — ABNORMAL LOW (ref 3.5–5.0)
Alkaline Phosphatase: 87 U/L (ref 38–126)
Anion gap: 6 (ref 5–15)
BUN: 20 mg/dL (ref 8–23)
CO2: 29 mmol/L (ref 22–32)
Calcium: 9.2 mg/dL (ref 8.9–10.3)
Chloride: 101 mmol/L (ref 98–111)
Creatinine, Ser: 0.84 mg/dL (ref 0.44–1.00)
GFR, Estimated: >60 mL/min (ref >60)
Glucose, Bld: 93 mg/dL (ref 70–99)
Potassium: 4 mmol/L (ref 3.5–5.1)
Sodium: 137 mmol/L (ref 135–145)
Total Bilirubin: 0.4 mg/dL (ref 0.0–1.2)
Total Protein: 7.1 g/dL (ref 6.5–8.1)

## 2024-02-14 LAB — IRON AND TIBC
Iron: 24 ug/dL — ABNORMAL LOW (ref 28–170)
Saturation Ratios: 9 % — ABNORMAL LOW (ref 10.4–31.8)
TIBC: 273 ug/dL (ref 250–450)
UIBC: 249 ug/dL

## 2024-02-14 LAB — FERRITIN: Ferritin: 126 ng/mL (ref 11–307)

## 2024-02-14 LAB — CBC
HCT: 33.7 % — ABNORMAL LOW (ref 36.0–46.0)
Hemoglobin: 10.8 g/dL — ABNORMAL LOW (ref 12.0–15.0)
MCH: 28.1 pg (ref 26.0–34.0)
MCHC: 32 g/dL (ref 30.0–36.0)
MCV: 87.8 fL (ref 80.0–100.0)
Platelets: 171 K/uL (ref 150–400)
RBC: 3.84 MIL/uL — ABNORMAL LOW (ref 3.87–5.11)
RDW: 13.9 % (ref 11.5–15.5)
WBC: 7.2 K/uL (ref 4.0–10.5)
nRBC: 0 % (ref 0.0–0.2)

## 2024-02-14 LAB — RETICULOCYTES
Immature Retic Fract: 11.1 % (ref 2.3–15.9)
RBC.: 3.9 MIL/uL (ref 3.87–5.11)
Retic Count, Absolute: 61.6 K/uL (ref 19.0–186.0)
Retic Ct Pct: 1.6 % (ref 0.4–3.1)

## 2024-02-14 LAB — FOLATE: Folate: 9.5 ng/mL (ref >5.9)

## 2024-02-14 LAB — VITAMIN B12: Vitamin B-12: 737 pg/mL (ref 180–914)

## 2024-02-14 MED ORDER — FENTANYL CITRATE (PF) 100 MCG/2ML IJ SOLN
INTRAMUSCULAR | Status: AC | PRN
  Administered 2024-02-14 (×2): 25 ug via INTRAVENOUS
  Administered 2024-02-14: 50 ug via INTRAVENOUS

## 2024-02-14 MED ORDER — MIDAZOLAM HCL (PF) 2 MG/2ML IJ SOLN
INTRAMUSCULAR | Status: AC | PRN
  Administered 2024-02-14 (×4): .5 mg via INTRAVENOUS

## 2024-02-14 MED ORDER — FENTANYL CITRATE (PF) 100 MCG/2ML IJ SOLN
INTRAMUSCULAR | Status: AC
  Filled 2024-02-14: qty 2

## 2024-02-14 MED ORDER — MIDAZOLAM HCL 2 MG/2ML IJ SOLN
INTRAMUSCULAR | Status: AC
  Filled 2024-02-14: qty 2

## 2024-02-14 MED ORDER — LIDOCAINE HCL 1 % IJ SOLN
INTRAMUSCULAR | Status: AC
  Filled 2024-02-14: qty 20

## 2024-02-14 NOTE — TOC Initial Note (Signed)
 Transition of Care [facility]) - Initial/Assessment Note    Patient Details  Name: Karen [name] MRN: [id number] Date of Birth: May 04, 1940  [facility]) CM/SW Contact:    Alfonse JONELLE Rex, RN Phone Number: 02/14/2024, 1:46 PM  Clinical Narrative:   Per Chart Review, patient admitted withaAbdominal pain/ 11cm liver cyst, continue pain management. -[facility] and general surgery consulted . PT eval pending, await recommendation. TOC will continue to follow.                      Patient Goals and CMS Choice            Expected Discharge Plan and Services                                              Prior Living Arrangements/Services                       Activities of Daily Living   ADL Screening (condition at time of admission) Independently performs ADLs?: Yes (appropriate for developmental age) Is the patient deaf or have difficulty hearing?: No Does the patient have difficulty seeing, even when wearing glasses/contacts?: No Does the patient have difficulty concentrating, remembering, or making decisions?: No  Permission Sought/Granted                  Emotional Assessment              Admission diagnosis:  Abdominal pain of multiple sites [R10.85] Abdominal pain [R10.9] Patient Active Problem List   Diagnosis Date Noted   Chronic diastolic heart failure (HCC) 02/12/2024   Hypertensive heart failure (HCC) 02/12/2024   Abdominal pain 02/12/2024   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   Posterior vitreous detachment of right eye 05/14/2022   Osteoporosis 05/19/2021   Internal derangement of left knee 05/19/2021   Vitamin D  deficiency 02/22/2019   Alopecia 05/02/2017   Iron deficiency anemia 08/05/2016   Gastroesophageal reflux disease without esophagitis 08/05/2016   Essential hypertension 12/15/2015   PCP:  Pcp, No Pharmacy:   [id number] [place], [place] - [address] AT [facility] OF [facility] & [address] [place] [address] Phone: [phone] Fax: [phone]  [facility] - [place], [place] - [address] [place] [address] Phone: [phone] Fax: [phone]     Social Drivers of Health (SDOH) Social History: SDOH Screenings   Food Insecurity: No Food Insecurity (02/12/2024)  Housing: Low Risk  (02/12/2024)  Transportation Needs: No Transportation Needs (02/12/2024)  Utilities: Not At Risk (02/12/2024)  Alcohol Screen: Low Risk  (11/03/2021)  Depression (PHQ2-9): Low Risk  (11/03/2021)  Financial Resource Strain: Low Risk  (11/03/2021)  Physical Activity: Sufficiently Active (11/03/2021)  Social Connections: Unknown (02/12/2024)  Stress: No Stress Concern Present (11/03/2021)  Tobacco Use: Low Risk  (02/12/2024)   SDOH Interventions:     Readmission Risk Interventions     No data to display

## 2024-02-14 NOTE — Progress Notes (Signed)
 [facility] PROGRESS NOTE   Karen [name] FMW:[id number] DOB: 1940/05/04 DOA: 02/12/2024  PCP: Pcp, No  Brief History: 83 y.o. female with medical history significant for 2 months of severe midepigastric area abdominal pain.  The patient does see [facility] as an outpatient.  She did have an endoscopy and was found to have H. pylori for which she completed treatment 2 weeks ago.  She has had a CT scan in the past which showed a large liver cyst.  She is in the process of that being evaluated.  The family mentioned that she was going to have a referral to general surgery to have the cyst removed.  In the meantime the patient has 10 out of 10 pain essentially all the time.  She also experienced some dizziness on the day of admission.  She was hospitalized for further management.     Consultants: Gen Surgery.  Interventional radiology  Procedures: None yet    Subjective/Interval History: Patient complains of significant pain in the upper abdomen this morning.  Denies any nausea vomiting.     Assessment/Plan:  Acute on chronic abdominal pain/hepatic cysts Symptoms have been ongoing for 2-1/2 months.   She has been seen by gastroenterology for her symptoms in the outpatient setting.  She underwent upper endoscopy about 2 weeks ago and was found to have H. pylori.  She is completed treatment for the same. She has had abdominal imaging studies.  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  This could be the reason for her symptoms.  It appears the plan was for referral to general surgery in the outpatient setting but she presented to the hospital due to significant symptoms. Patient was seen by general surgery.  They have consulted IR to consider sclerotherapy.  IR has seen the patient and they plan to do aspiration of the cyst.  Hopefully this will help her symptoms. LFTs are noted to be normal. Pain control.  Hypokalemia Supplemented.  Magnesium is 2.2.  Normocytic anemia Stable.   No evidence of overt blood loss.   Anemia panel reviewed.  Ferritin is 126, iron 24, TIBC 273, percent saturation 9.  B12 level 737.  Folic acid 9.5. Hemoglobin noted to be slightly lower today which is likely due to dilutional effect. It should be noted that she is a [employer] Witness and refuses blood transfusion.  Essential hypertension Antihypertensives currently on hold.  Patient was experiencing some lightheadedness.  Most likely due to pain issues.  Her antihypertensives are on hold.  Mobilization by PT and OT is pending.    DVT Prophylaxis: SCDs Code Status: DNR Family Communication: No family at bedside today. Disposition Plan: Hopefully home when cleared for discharge    Medications: Scheduled:  hyaluronidase Human  150 Units Subcutaneous Once   sodium chloride  flush  3 mL Intravenous Q12H   Continuous: EMW:apdjrnibo, HYDROmorphone (DILAUDID) injection, meclizine , ondansetron  **OR** ondansetron  (ZOFRAN ) IV  Antibiotics: Anti-infectives (From admission, onward)    None       Objective:  Vital Signs  Vitals:   02/13/24 0631 02/13/24 1316 02/13/24 2135 02/14/24 0701  BP: (!) 114/46 (!) 130/59 123/68 135/68  Pulse: 80 65 78 71  Resp: 15  18 17   Temp: 98 F (36.7 C) 98.2 F (36.8 C) 98.6 F (37 C) 98.3 F (36.8 C)  TempSrc: Oral Oral Oral Oral  SpO2: 93% 95% 97% 97%  Weight:      Height:        Intake/Output Summary (Last 24 hours) at  02/14/2024 0956 Last data filed at 02/14/2024 0600 Gross per 24 hour  Intake 723 ml  Output --  Net 723 ml   Filed Weights   02/12/24 1122  Weight: 70.3 kg    General appearance: Awake alert.  In no distress Resp: Clear to auscultation bilaterally.  Normal effort Cardio: S1-S2 is normal regular.  No S3-S4.  No rubs murmurs or bruit GI: Tender in the upper abdomen without any rebound rigidity or guarding. No obvious focal neurological deficits.  Lab Results:  Data Reviewed: I have personally reviewed following  labs and reports of the imaging studies  CBC: Recent Labs  Lab 02/12/24 1200 02/13/24 0451 02/14/24 0447  WBC 7.8 8.0 7.2  HGB 11.7* 11.0* 10.8*  HCT 37.2 36.0 33.7*  MCV 87.5 88.2 87.8  PLT 200 201 171    Basic Metabolic Panel: Recent Labs  Lab 02/12/24 1200 02/13/24 0451 02/14/24 0447  NA 138 138 137  K 3.2* 3.8 4.0  CL 99 103 101  CO2 28 27 29   GLUCOSE 110* 105* 93  BUN 18 26* 20  CREATININE 0.98 1.04* 0.84  CALCIUM 10.0 9.3 9.2  MG  --  2.2  --     GFR: Estimated Creatinine Clearance: 49 mL/min (by C-G formula based on SCr of 0.84 mg/dL).  Liver Function Tests: Recent Labs  Lab 02/12/24 1200 02/13/24 0451 02/14/24 0447  AST 25 25 26   ALT 18 16 18   ALKPHOS 89 88 87  BILITOT 0.6 0.5 0.4  PROT 7.8 7.1 7.1  ALBUMIN 3.9 3.5 3.4*    Recent Labs  Lab 02/12/24 1200  LIPASE 49    Radiology Studies: CT ABDOMEN PELVIS W CONTRAST Result Date: 02/12/2024 CLINICAL DATA:  Acute abdominal pain. Patient reports continued and worsening left upper abdominal pain. EXAM: CT ABDOMEN AND PELVIS WITH CONTRAST TECHNIQUE: Multidetector CT imaging of the abdomen and pelvis was performed using the standard protocol following bolus administration of intravenous contrast. RADIATION DOSE REDUCTION: This exam was performed according to the departmental dose-optimization program which includes automated exposure control, adjustment of the mA and/or kV according to patient size and/or use of iterative reconstruction technique. CONTRAST:  OMNIPAQUE  IOHEXOL  300 MG/ML  SOLN COMPARISON:  12/15/2023 FINDINGS: Lower chest: Subsegmental atelectasis in the right lower lobe. No pleural effusion. Small hiatal hernia. Hepatobiliary: Numerous cysts in the liver of varying sizes. Largest cyst is within the left lobe of the liver and spans greater than 11 cm transverse and is mildly exophytic. No evidence of solid liver lesion. Partially distended gallbladder, no biliary dilatation. Pancreas:  Fatty atrophy. No ductal dilatation or inflammation. No evidence of focal lesion. Spleen: Normal in size without focal abnormality. Adrenals/Urinary Tract: No adrenal nodule. No hydronephrosis or perinephric edema. Homogeneous renal enhancement with symmetric excretion on delayed phase imaging. Left renal cyst. No further follow-up imaging is recommended. Urinary bladder is completely decompressed. Stomach/Bowel: Small hiatal hernia. The stomach is nondistended. No bowel obstruction or inflammatory change. Sigmoid colonic diverticulosis without diverticulitis. Normal appendix. Vascular/Lymphatic: Aortic atherosclerosis. No aortic aneurysm. No acute vascular findings. No abdominopelvic adenopathy. Reproductive: Uterus and bilateral adnexa are unremarkable. Other: No free air, free fluid, or intra-abdominal fluid collection. Small fat containing umbilical hernia. Musculoskeletal: Levo scoliotic curvature of the spine. Multilevel facet hypertrophy in the spine. There are no acute or suspicious osseous abnormalities. IMPRESSION: 1. No acute abnormality or explanation for abdominal pain. 2. Numerous cysts in the liver of varying sizes. Largest cyst is within the left lobe of the  liver and spans greater than 11 cm transverse and is mildly exophytic. Capsular stretching could potentially be a source of left-sided pain. Overall, no change from prior exam. 3. Sigmoid colonic diverticulosis without diverticulitis. 4. Small hiatal hernia. Aortic Atherosclerosis (ICD10-I70.0). Electronically Signed   By: Andrea Gasman M.D.   On: 02/12/2024 15:27       LOS: 2 days   Karen [name]  [facility] Pager on [email or web address]  02/14/2024, 9:56 AM

## 2024-02-14 NOTE — Plan of Care (Signed)
   Problem: Clinical Measurements: Goal: Will remain free from infection Outcome: Progressing Goal: Diagnostic test results will improve Outcome: Progressing

## 2024-02-14 NOTE — Procedures (Signed)
 Interventional Radiology Procedure:   Indications: Abdominal pain and multiple hepatic cysts  Procedure: US  guided aspiration of left hepatic cysts  Findings: Aspirated clear yellow fluid from two left hepatic cysts.  Removed 300 ml from each cyst.   Complications: None     EBL: Minimal  Plan:   Fluid sent for culture and cytology per surgery request. Bedrest 2 hours   Janett Kamath R. Philip, MD  Pager: [phone]

## 2024-02-14 NOTE — Progress Notes (Signed)
 PT Cancellation Note  Patient Details Name: Karen [name] MRN: [id number] DOB: 1940/07/17   Cancelled Treatment:    Reason Eval/Treat Not Completed: Patient at procedure or test/unavailable Pt in IR for US  guided aspiration of  hepatic cysts. Continue efforts to complete PT eval   [facility] 02/14/2024, 4:28 PM

## 2024-02-14 NOTE — Consult Note (Signed)
 Chief Complaint: Numerous liver cysts  Referring Provider(s): Dr. Paola  Supervising Physician: Philip Cornet  Patient Status: [facility] - In-pt  History of Present Illness: Karen [name] is a 83 y.o. female with PMH of HTN, H. pylori who presented to the ED 10/26 with left sided abd pain x 2 months. Previous imaging had revealed numerous liver cysts with 10/26 CT abd/pelv showing the same, the largest cyst measuring 11 cm within L lobe. Given potential that abd pain may be related to capsular stretching, IR was consulted by Dr. Paola with General Surgery. She spoke with Dr. Hughes who agreed that asp/poss sclerotherapy would be reasonable IR consideration. The case was further reviewed by 10/28 on site attending, Dr. Philip; IR will offer aspiration only.   Confirms NPO since MN.  Does not wear CPAP or use supplemental home O2.  Patient lying in bed, pleasant, interested in her care. Denies fever, chills, SOB, CP, sore throat, N/V, blood in stool or urine, abnormal bruising. She does endorses upper abd pain which she states is unchanged since presentation. Daughter is present at bedside for interview and exam.    Allergies Reviewed:  Patient has no known allergies.   Patient is DNR and would like this maintained in procedure. Team also aware that she is a [employer] Witness and would not like blood products.   Past Medical History:  Diagnosis Date   Hypertension     Past Surgical History:  Procedure Laterality Date   CATARACT EXTRACTION, BILATERAL     EYE SURGERY     on her retina      Medications: Prior to Admission medications   Medication Sig Start Date End Date Taking? Authorizing Provider  cyclobenzaprine  (FLEXERIL ) 5 MG tablet Take 1 tablet (5 mg total) by mouth 2 (two) times daily as needed for muscle spasms. 09/05/22  Yes Mayer, Jodi R, NP  losartan -hydrochlorothiazide (HYZAAR) 100-25 MG tablet TAKE 1 TABLET BY MOUTH DAILY 02/10/22  Yes Thedora Garnette HERO, MD  meclizine   (ANTIVERT ) 12.5 MG tablet Take 1 tablet (12.5 mg total) by mouth 3 (three) times daily as needed for dizziness. 09/18/20  Yes Cirigliano, Mary K, DO  Na Sulfate-K Sulfate-Mg Sulfate concentrate (SUPREP) 17.5-3.13-1.6 GM/177ML SOLN Take 1 kit by mouth once. 12/27/23  Yes [provider]  oxyCODONE -acetaminophen  (PERCOCET/ROXICET) 5-325 MG tablet Take 1 tablet by mouth every 6 (six) hours as needed for severe pain (pain score 7-10). 12/15/23  Yes Tegeler, Lonni PARAS, MD  SUTAB  [phone] MG TABS Take 24 tablets by mouth once. 12/28/23  Yes [provider]  omeprazole  (PRILOSEC) 20 MG capsule Take 1 capsule (20 mg total) by mouth 2 (two) times daily for 14 days. 01/09/24 01/23/24  Stacia Glendia BRAVO, MD     Family History  Problem Relation Age of Onset   Hypertension Mother    Hyperlipidemia Mother    Hypertension Father    Colon cancer Neg Hx    Esophageal cancer Neg Hx    Rectal cancer Neg Hx    Stomach cancer Neg Hx     Social History   Socioeconomic History   Marital status: Widowed    Spouse name: Not on file   Number of children: 4   Years of education: Not on file   Highest education level: Not on file  Occupational History   Not on file  Tobacco Use   Smoking status: Never   Smokeless tobacco: Never  Vaping Use   Vaping status: Never Used  Substance and  Sexual Activity   Alcohol use: No   Drug use: No   Sexual activity: Not Currently    Birth control/protection: None  Other Topics Concern   Not on file  Social History Narrative   Not on file   Social Drivers of Health   Financial Resource Strain: Low Risk  (11/03/2021)   Overall Financial Resource Strain (CARDIA)    Difficulty of Paying Living Expenses: Not hard at all  Food Insecurity: No Food Insecurity (02/12/2024)   Hunger Vital Sign    Worried About Running Out of Food in the Last Year: Never true    Ran Out of Food in the Last Year: Never true  Transportation Needs: No Transportation Needs  (02/12/2024)   PRAPARE - Administrator, Civil Service (Medical): No    Lack of Transportation (Non-Medical): No  Physical Activity: Sufficiently Active (11/03/2021)   Exercise Vital Sign    Days of Exercise per Week: 5 days    Minutes of Exercise per Session: 30 min  Stress: No Stress Concern Present (11/03/2021)   [employer] of Occupational Health - Occupational Stress Questionnaire    Feeling of Stress : Not at all  Social Connections: Unknown (02/12/2024)   Social Connection and Isolation Panel    Frequency of Communication with Friends and Family: Not on file    Frequency of Social Gatherings with Friends and Family: Not on file    Attends Religious Services: Not on file    Active Member of Clubs or Organizations: Yes    Attends Banker Meetings: Not on file    Marital Status: Widowed     Review of Systems: A 12 point ROS discussed and pertinent positives are indicated in the HPI above.  All other systems are negative.    Vital Signs: BP 135/68 (BP Location: Right Arm)   Pulse 71   Temp 98.3 F (36.8 C) (Oral)   Resp 17   Ht 5' 5 (1.651 m)   Wt 154 lb 15.7 oz (70.3 kg)   SpO2 97%   BMI 25.79 kg/m   Advance Care Plan: No documents on file. DNR status present on Code Status from previous discussions.     Physical Exam HENT:     Mouth/Throat:     Mouth: Mucous membranes are moist.     Pharynx: Oropharynx is clear.  Cardiovascular:     Rate and Rhythm: Normal rate and regular rhythm.  Pulmonary:     Effort: Pulmonary effort is normal.     Breath sounds: Normal breath sounds.  Abdominal:     General: There is no distension.     Palpations: Abdomen is soft.     Tenderness: There is abdominal tenderness.     Comments: Tenderness to epigastric and LUQ. Minimal tenderness RUQ and lower quadrants.   Skin:    General: Skin is warm and dry.     Coloration: Skin is not jaundiced.     Comments: No rash or lesion over planned puncture  site  Neurological:     Mental Status: She is alert and oriented to person, place, and time.  Psychiatric:        Mood and Affect: Mood normal.        Behavior: Behavior normal.        Thought Content: Thought content normal.        Judgment: Judgment normal.     Imaging: CT ABDOMEN PELVIS W CONTRAST Result Date: 02/12/2024 CLINICAL DATA:  Acute abdominal  pain. Patient reports continued and worsening left upper abdominal pain. EXAM: CT ABDOMEN AND PELVIS WITH CONTRAST TECHNIQUE: Multidetector CT imaging of the abdomen and pelvis was performed using the standard protocol following bolus administration of intravenous contrast. RADIATION DOSE REDUCTION: This exam was performed according to the departmental dose-optimization program which includes automated exposure control, adjustment of the mA and/or kV according to patient size and/or use of iterative reconstruction technique. CONTRAST:  OMNIPAQUE  IOHEXOL  300 MG/ML  SOLN COMPARISON:  12/15/2023 FINDINGS: Lower chest: Subsegmental atelectasis in the right lower lobe. No pleural effusion. Small hiatal hernia. Hepatobiliary: Numerous cysts in the liver of varying sizes. Largest cyst is within the left lobe of the liver and spans greater than 11 cm transverse and is mildly exophytic. No evidence of solid liver lesion. Partially distended gallbladder, no biliary dilatation. Pancreas: Fatty atrophy. No ductal dilatation or inflammation. No evidence of focal lesion. Spleen: Normal in size without focal abnormality. Adrenals/Urinary Tract: No adrenal nodule. No hydronephrosis or perinephric edema. Homogeneous renal enhancement with symmetric excretion on delayed phase imaging. Left renal cyst. No further follow-up imaging is recommended. Urinary bladder is completely decompressed. Stomach/Bowel: Small hiatal hernia. The stomach is nondistended. No bowel obstruction or inflammatory change. Sigmoid colonic diverticulosis without diverticulitis. Normal  appendix. Vascular/Lymphatic: Aortic atherosclerosis. No aortic aneurysm. No acute vascular findings. No abdominopelvic adenopathy. Reproductive: Uterus and bilateral adnexa are unremarkable. Other: No free air, free fluid, or intra-abdominal fluid collection. Small fat containing umbilical hernia. Musculoskeletal: Levo scoliotic curvature of the spine. Multilevel facet hypertrophy in the spine. There are no acute or suspicious osseous abnormalities. IMPRESSION: 1. No acute abnormality or explanation for abdominal pain. 2. Numerous cysts in the liver of varying sizes. Largest cyst is within the left lobe of the liver and spans greater than 11 cm transverse and is mildly exophytic. Capsular stretching could potentially be a source of left-sided pain. Overall, no change from prior exam. 3. Sigmoid colonic diverticulosis without diverticulitis. 4. Small hiatal hernia. Aortic Atherosclerosis (ICD10-I70.0). Electronically Signed   By: Andrea Gasman M.D.   On: 02/12/2024 15:27    Labs:  CBC: Recent Labs    12/15/23 1856 02/12/24 1200 02/13/24 0451 02/14/24 0447  WBC 7.8 7.8 8.0 7.2  HGB 11.8* 11.7* 11.0* 10.8*  HCT 35.5* 37.2 36.0 33.7*  PLT 209 200 201 171    COAGS: Recent Labs    02/13/24 1301  INR 1.1    BMP: Recent Labs    12/15/23 1856 02/12/24 1200 02/13/24 0451 02/14/24 0447  NA 140 138 138 137  K 4.1 3.2* 3.8 4.0  CL 102 99 103 101  CO2 27 28 27 29   GLUCOSE 93 110* 105* 93  BUN 22 18 26* 20  CALCIUM 9.7 10.0 9.3 9.2  CREATININE 0.99 0.98 1.04* 0.84  GFRNONAA 56* 56* 53* >60    LIVER FUNCTION TESTS: Recent Labs    12/15/23 1856 02/12/24 1200 02/13/24 0451 02/14/24 0447  BILITOT 0.2 0.6 0.5 0.4  AST 20 25 25 26   ALT 10 18 16 18   ALKPHOS 69 89 88 87  PROT 7.8 7.8 7.1 7.1  ALBUMIN 4.0 3.9 3.5 3.4*    TUMOR MARKERS: No results for input(s): AFPTM, CEA, CA199, CHROMGRNA in the last 8760 hours.  Assessment and Plan:  Request for  image guided liver  cyst aspiration only approved by Dr. Philip for tentatively 02/14/24. No contraindications for procedure identified in ROS, physical exam, or review of pre-sedation considerations. Labs reviewed and within acceptable range  10/26 CT imaging available and reviewed: Numerous cysts in the liver of varying sizes. Largest cyst is within the left lobe of the liver and spans greater than 11 cm transverse and is mildly exophytic. Capsular stretching could potentially be a source of left-sided pain. Overall, no change from prior exam. VSS, afebrile Patient does not take a blood thinner Abx not indicated NPO for mod sedation    Risks and benefits of aspiration were discussed with the patient including bleeding, infection, damage to adjacent structures, and sepsis.  All of the patient's questions were answered, patient is agreeable to proceed. Consent signed and in chart.   Thank you for allowing our service to participate in Jerni Selmer 's care.    Electronically Signed: Laymon Coast, NP   02/14/2024, 8:55 AM     I spent a total of 20 Minutes    in face to face in clinical consultation, greater than 50% of which was counseling/coordinating care for image guided liver cyst aspiration.   (A copy of this note was sent to the referring provider and the time of visit.)

## 2024-02-14 NOTE — Progress Notes (Signed)
 Daughter will like to be called once mom is out of surgery

## 2024-02-15 DIAGNOSIS — R1012 Left upper quadrant pain: Secondary | ICD-10-CM

## 2024-02-15 DIAGNOSIS — R1085 Abdominal pain of multiple sites: Secondary | ICD-10-CM

## 2024-02-15 LAB — CBC
HCT: 35 % — ABNORMAL LOW (ref 36.0–46.0)
Hemoglobin: 10.7 g/dL — ABNORMAL LOW (ref 12.0–15.0)
MCH: 27 pg (ref 26.0–34.0)
MCHC: 30.6 g/dL (ref 30.0–36.0)
MCV: 88.4 fL (ref 80.0–100.0)
Platelets: 190 K/uL (ref 150–400)
RBC: 3.96 MIL/uL (ref 3.87–5.11)
RDW: 13.6 % (ref 11.5–15.5)
WBC: 5.8 K/uL (ref 4.0–10.5)
nRBC: 0 % (ref 0.0–0.2)

## 2024-02-15 LAB — BASIC METABOLIC PANEL WITH GFR
Anion gap: 7 (ref 5–15)
BUN: 21 mg/dL (ref 8–23)
CO2: 29 mmol/L (ref 22–32)
Calcium: 9.1 mg/dL (ref 8.9–10.3)
Chloride: 102 mmol/L (ref 98–111)
Creatinine, Ser: 0.76 mg/dL (ref 0.44–1.00)
GFR, Estimated: >60 mL/min (ref >60)
Glucose, Bld: 95 mg/dL (ref 70–99)
Potassium: 4.5 mmol/L (ref 3.5–5.1)
Sodium: 137 mmol/L (ref 135–145)

## 2024-02-15 MED ORDER — FERROUS SULFATE 325 (65 FE) MG PO TABS
325.0000 mg | ORAL_TABLET | Freq: Every day | ORAL | Status: DC
  Administered 2024-02-16 – 2024-02-17 (×2): 325 mg via ORAL
  Filled 2024-02-15 (×2): qty 1

## 2024-02-15 MED ORDER — LOSARTAN POTASSIUM 50 MG PO TABS
50.0000 mg | ORAL_TABLET | Freq: Every day | ORAL | Status: DC
  Administered 2024-02-15 – 2024-02-17 (×3): 50 mg via ORAL
  Filled 2024-02-15 (×3): qty 1

## 2024-02-15 MED ORDER — PANTOPRAZOLE SODIUM 40 MG PO TBEC
40.0000 mg | DELAYED_RELEASE_TABLET | Freq: Every day | ORAL | Status: DC
  Administered 2024-02-15 – 2024-02-17 (×3): 40 mg via ORAL
  Filled 2024-02-15 (×3): qty 1

## 2024-02-15 MED ORDER — NYSTATIN 100000 UNIT/ML MT SUSP
5.0000 mL | Freq: Four times a day (QID) | OROMUCOSAL | Status: DC
  Administered 2024-02-15 – 2024-02-17 (×8): 500000 [IU] via ORAL
  Filled 2024-02-15 (×9): qty 5

## 2024-02-15 MED ORDER — FERROUS SULFATE 325 (65 FE) MG PO TBEC
325.0000 mg | DELAYED_RELEASE_TABLET | Freq: Every day | ORAL | Status: DC
  Filled 2024-02-15: qty 1

## 2024-02-15 MED ORDER — TRAMADOL HCL 50 MG PO TABS: 50.0000 mg | ORAL_TABLET | Freq: Three times a day (TID) | ORAL | Status: DC | PRN

## 2024-02-15 MED ORDER — ENSURE PLUS HIGH PROTEIN PO LIQD
237.0000 mL | Freq: Two times a day (BID) | ORAL | Status: DC
  Administered 2024-02-15 – 2024-02-17 (×5): 237 mL via ORAL

## 2024-02-15 NOTE — Plan of Care (Signed)
 ?  Problem: Clinical Measurements: ?Goal: Will remain free from infection ?Outcome: Progressing ?  ?

## 2024-02-15 NOTE — Progress Notes (Signed)
 Physical Therapy Evaluation Patient Details Name: Karen [name] MRN: [id number] DOB: [birth year] Today's Date: 02/15/2024  History of Present Illness  83 y.o. female with PMH of HTN, H. pylori who presented to the ED 10/26 with left sided abd pain x 2 months. Previous imaging had revealed numerous liver cysts with 10/26 CT abd/pelv showing the same, the largest cyst measuring 11 cm within L lobe. Pt is s/p aspiration of cyst.  Clinical Impression  Pt presents with the above diagnosis and c/o abdominal pain that has improved since aspiration. Pt is mod independent with mobility. Pt educated on log roll technique with bed mobility to decreased abdominal discomfort. Pt lives alone in a 1 story apartment with no steps to enter. Pt is safe to ambulate in the room and halls until d/c home. Pt is d/c from acute PT services.      If plan is discharge home, recommend the following: Assist for transportation;Help with stairs or ramp for entrance   Can travel by private vehicle        Equipment Recommendations None recommended by PT  Recommendations for Other Services       Functional Status Assessment       Precautions / Restrictions Precautions Precautions: None Restrictions Weight Bearing Restrictions Per Provider Order: No      Mobility  Bed Mobility Overal bed mobility: Independent                  Transfers Overall transfer level: Modified independent Equipment used: None                    Ambulation/Gait Ambulation/Gait assistance: Modified independent (Device/Increase time) Gait Distance (Feet): 250 Feet Assistive device: None Gait Pattern/deviations: Decreased stride length Gait velocity: decreased     General Gait Details: no LOB, decreased cadence and cautious due to abd pain and has not mobolized in a few days.  Stairs            Wheelchair Mobility     Tilt Bed    Modified Rankin (Stroke Patients Only)       Balance Overall  balance assessment: No apparent balance deficits (not formally assessed)                                           Pertinent Vitals/Pain Pain Assessment Pain Assessment: 0-10 Pain Score: 1  Pain Location: abdomin Pain Descriptors / Indicators: Discomfort Pain Intervention(s): Limited activity within patient's tolerance, Monitored during session    Home Living                          Prior Function                       Extremity/Trunk Assessment                Communication   Communication Communication: No apparent difficulties    Cognition Arousal: Alert Behavior During Therapy: WFL for tasks assessed/performed   PT - Cognitive impairments: No apparent impairments                         Following commands: Intact       Cueing Cueing Techniques: Verbal cues     General Comments      Exercises     Assessment/Plan  PT Assessment    PT Problem List         PT Treatment Interventions      PT Goals (Current goals can be found in the Care Plan section)       Frequency       Co-evaluation               AM-PAC PT 6 Clicks Mobility  Outcome Measure Help needed turning from your back to your side while in a flat bed without using bedrails?: None Help needed moving from lying on your back to sitting on the side of a flat bed without using bedrails?: None Help needed moving to and from a bed to a chair (including a wheelchair)?: None Help needed standing up from a chair using your arms (e.g., wheelchair or bedside chair)?: None Help needed to walk in hospital room?: None Help needed climbing 3-5 steps with a railing? : A Little 6 Click Score: 23    End of Session   Activity Tolerance: Patient tolerated treatment well Patient left: in chair;with call bell/phone within reach Nurse Communication: Mobility status PT Visit Diagnosis: Difficulty in walking, not elsewhere classified (R26.2)     Time: 9150-9095 PT Time Calculation (min) (ACUTE ONLY): 15 min   Charges:   PT Evaluation $PT Eval Low Complexity: 1 Low   PT General Charges $$ ACUTE PT VISIT: 1 Visit       Ike Killings, PT  Killings Ike Halter 02/15/2024, 9:11 AM

## 2024-02-15 NOTE — Progress Notes (Signed)
 PROGRESS NOTE    Karen [name]  FMW:[id number] DOB: October 06, 1940 DOA: 02/12/2024 PCP: Pcp, No   Brief Narrative: 83 year old with past medical history significant for 46-month history of severe midepigastric abdominal pain.  Patient follows with [facility] as an outpatient.  She did have an endoscopy and was found to have H. pylori, for which she completed treatment 2 weeks prior to admission.  Further evaluation CT scan showed a large liver cyst.  Per family she was going to be referred to general surgery to have the cyst removed.  Patient present with abdominal pain 10 out of 10 and dizziness.   Assessment & Plan:   Principal Problem:   Abdominal pain Active Problems:   Alopecia   Osteoporosis   Cystic disease of liver   H. pylori infection   1-Acute on chronic abdominal pain/hepatic cysts: - Patient with acute on chronic abdominal pain ongoing for the last 2-1/65-month. - Underwent endoscopy 2 weeks ago and found to have H. pylori positive and completed treatment. - CT showed large hepatic cyst. - General Surgery consulted and recommended IR consult for aspiration and or a sclerotherapy. -underwent Liver cyst aspiration, yielding 300 cc from each cyst on 10/29. She report some improvement of abdominal pain. Pain down to 9/10/ does relates she is able to breath better and move better.  GI have been consulted.  Will order tramadol PRN  Hypokalemia: -Replaced.   Normocytic anemia: Iron deficiency -iron deficiency.  -start iron supplement.   Essential hypertension: -Holding losartan  and hydrochlorothiazide.  Resume losartan    Oral Thrush; start Nystatin.   Estimated body mass index is 25.79 kg/m as calculated from the following:   Height as of this encounter: 5' 5 (1.651 m).   Weight as of this encounter: 70.3 kg.   DVT prophylaxis: SCD Code Status: DNR Family Communication:care discussed with patient, daughter was oer phone while on rounds.  Disposition Plan:   Status is: Inpatient Remains inpatient appropriate because: management of abdominal pain     Consultants:  Sx GI IR  Procedures:  US  aspiration cyst   Antimicrobials:    Subjective: She report abdominal pain 9/10 today Notice some improvement of pain, after procedure, report she is able to breath better, move better.  Report poor appetite   Objective: Vitals:   02/14/24 1632 02/14/24 1804 02/14/24 2234 02/15/24 0558  BP: (!) 152/68 (!) 153/59 (!) 128/57 (!) 142/66  Pulse: 67 77 62 67  Resp: 14 16 16 16   Temp: 98.3 F (36.8 C) 98.6 F (37 C) 98.8 F (37.1 C) 98 F (36.7 C)  TempSrc: Oral Oral Oral Oral  SpO2:  96% 96% 98%  Weight:      Height:        Intake/Output Summary (Last 24 hours) at 02/15/2024 0817 Last data filed at 02/15/2024 0500 Gross per 24 hour  Intake 820 ml  Output --  Net 820 ml   Filed Weights   02/12/24 1122  Weight: 70.3 kg    Examination:  General exam: Appears calm and comfortable  Respiratory system: Clear to auscultation. Respiratory effort normal. Cardiovascular system: S1 & S2 heard, RRR. No JVD, murmurs, rubs, gallops or clicks. No pedal edema. Gastrointestinal system: Abdomen is nondistended, soft and mild tenderness.  Central nervous system: Alert and oriented. No focal neurological deficits. Extremities: Symmetric 5 x 5 power.  Data Reviewed: I have personally reviewed following labs and imaging studies  CBC: Recent Labs  Lab 02/12/24 1200 02/13/24 0451 02/14/24 0447 02/15/24 9488  WBC 7.8 8.0 7.2 5.8  HGB 11.7* 11.0* 10.8* 10.7*  HCT 37.2 36.0 33.7* 35.0*  MCV 87.5 88.2 87.8 88.4  PLT 200 201 171 190   Basic Metabolic Panel: Recent Labs  Lab 02/12/24 1200 02/13/24 0451 02/14/24 0447 02/15/24 0511  NA 138 138 137 137  K 3.2* 3.8 4.0 4.5  CL 99 103 101 102  CO2 28 27 29 29   GLUCOSE 110* 105* 93 95  BUN 18 26* 20 21  CREATININE 0.98 1.04* 0.84 0.76  CALCIUM 10.0 9.3 9.2 9.1  MG  --  2.2  --   --     GFR: Estimated Creatinine Clearance: 51.5 mL/min (by C-G formula based on SCr of 0.76 mg/dL). Liver Function Tests: Recent Labs  Lab 02/12/24 1200 02/13/24 0451 02/14/24 0447  AST 25 25 26   ALT 18 16 18   ALKPHOS 89 88 87  BILITOT 0.6 0.5 0.4  PROT 7.8 7.1 7.1  ALBUMIN 3.9 3.5 3.4*   Recent Labs  Lab 02/12/24 1200  LIPASE 49   No results for input(s): AMMONIA in the last 168 hours. Coagulation Profile: Recent Labs  Lab 02/13/24 1301  INR 1.1   Cardiac Enzymes: No results for input(s): CKTOTAL, CKMB, CKMBINDEX, TROPONINI in the last 168 hours. BNP (last 3 results) No results for input(s): PROBNP in the last 8760 hours. HbA1C: No results for input(s): HGBA1C in the last 72 hours. CBG: No results for input(s): GLUCAP in the last 168 hours. Lipid Profile: No results for input(s): CHOL, HDL, LDLCALC, TRIG, CHOLHDL, LDLDIRECT in the last 72 hours. Thyroid Function Tests: No results for input(s): TSH, T4TOTAL, FREET4, T3FREE, THYROIDAB in the last 72 hours. Anemia Panel: Recent Labs    02/14/24 0447  VITAMINB12 737  FOLATE 9.5  FERRITIN 126  TIBC 273  IRON 24*  RETICCTPCT 1.6   Sepsis Labs: No results for input(s): PROCALCITON, LATICACIDVEN in the last 168 hours.  Recent Results (from the past 240 hours)  Aerobic/Anaerobic Culture w Gram Stain (surgical/deep wound)     Status: None (Preliminary result)   Collection Time: 02/14/24  4:18 PM   Specimen: Cyst; Body Fluid  Result Value Ref Range Status   Specimen Description   Final    CYSTS Performed at [facility], [address], [place], [place] [address]    Special Requests   Final    NONE Performed at [facility], [address] [place], [place] [address]    Gram Stain NO WBC SEEN NO ORGANISMS SEEN   Final   Culture   Final    NO GROWTH < 12 HOURS Performed at [facility], [address] [place], [place]  [address]    Report Status PENDING  Incomplete         Radiology Studies: US  FINE NEEDLE ASP 1ST LESION Result Date: 02/14/2024 INDICATION: 83 year old with abdominal pain and multiple large hepatic cysts. EXAM: ULTRASOUND-GUIDED ASPIRATION OF HEPATIC CYSTS MEDICATIONS: Moderate sedation ANESTHESIA/SEDATION: Moderate (conscious) sedation was employed during this procedure. A total of Versed 2 mg and fentanyl 100 mcg was administered intravenously at the order of the provider performing the procedure. Total intra-service moderate sedation time: 24 minutes. Patient's level of consciousness and vital signs were monitored continuously by radiology nurse throughout the procedure under the supervision of the provider performing the procedure. COMPLICATIONS: None immediate. PROCEDURE: Informed written consent was obtained from the patient after a thorough discussion of the procedural risks, benefits and alternatives. All questions were  addressed. A timeout was performed prior to the initiation of the procedure. Large cysts were identified in the anterior abdomen and involving the left hepatic lobe. Anterior abdomen was prepped with chlorhexidine and sterile field was created. Skin was anesthetized with 1% lidocaine. Using ultrasound guidance, a Yueh catheter was directed into the largest left hepatic cyst. 300 mL of clear yellow fluid was removed. Fluid was sent for culture and cytology. Ultrasound demonstrated a large residual cyst in this area suggesting there are probably 2 separate cysts. A new Yueh catheter was directed into the other cyst using ultrasound guidance and another 300 mL of clear yellow fluid was removed. Bandage placed over the puncture site. FINDINGS: Multiple large cysts in the liver. Largest cystic structure in the left hepatic lobe appears to represent 2 cysts. Two separate aspirations were performed and a total of 600 mL of fluid was removed. IMPRESSION: Successful ultrasound-guided  aspiration of large left hepatic cysts. Total of 600 mL of fluid was removed. Electronically Signed   By: Juliene Balder M.D.   On: 02/14/2024 18:33   US  FINE NEEDLE ASP EA ADDL LESION Result Date: 02/14/2024 INDICATION: 83 year old with abdominal pain and multiple large hepatic cysts. EXAM: ULTRASOUND-GUIDED ASPIRATION OF HEPATIC CYSTS MEDICATIONS: Moderate sedation ANESTHESIA/SEDATION: Moderate (conscious) sedation was employed during this procedure. A total of Versed 2 mg and fentanyl 100 mcg was administered intravenously at the order of the provider performing the procedure. Total intra-service moderate sedation time: 24 minutes. Patient's level of consciousness and vital signs were monitored continuously by radiology nurse throughout the procedure under the supervision of the provider performing the procedure. COMPLICATIONS: None immediate. PROCEDURE: Informed written consent was obtained from the patient after a thorough discussion of the procedural risks, benefits and alternatives. All questions were addressed. A timeout was performed prior to the initiation of the procedure. Large cysts were identified in the anterior abdomen and involving the left hepatic lobe. Anterior abdomen was prepped with chlorhexidine and sterile field was created. Skin was anesthetized with 1% lidocaine. Using ultrasound guidance, a Yueh catheter was directed into the largest left hepatic cyst. 300 mL of clear yellow fluid was removed. Fluid was sent for culture and cytology. Ultrasound demonstrated a large residual cyst in this area suggesting there are probably 2 separate cysts. A new Yueh catheter was directed into the other cyst using ultrasound guidance and another 300 mL of clear yellow fluid was removed. Bandage placed over the puncture site. FINDINGS: Multiple large cysts in the liver. Largest cystic structure in the left hepatic lobe appears to represent 2 cysts. Two separate aspirations were performed and a total of 600  mL of fluid was removed. IMPRESSION: Successful ultrasound-guided aspiration of large left hepatic cysts. Total of 600 mL of fluid was removed. Electronically Signed   By: Juliene Balder M.D.   On: 02/14/2024 18:33        Scheduled Meds:  hyaluronidase Human  150 Units Subcutaneous Once   sodium chloride  flush  3 mL Intravenous Q12H   Continuous Infusions:   LOS: 3 days    Time spent: 35 Minutes    Nishika Parkhurst A Rea Kalama, MD [facility]   If 7PM-7AM, please contact night-coverage [email or web address]  02/15/2024, 8:17 AM

## 2024-02-15 NOTE — Consult Note (Addendum)
 Consultation  Referring Provider:  [facility]  Primary Care Physician:  Pcp, No Primary Gastroenterologist:  Dr. Stacia       Reason for Consultation: Upper abdominal pain     LOS: 3 days          HPI:   Karen [name] is a 83 y.o. female with past medical history significant for hypertension presents for evaluation of upper abdominal pain.  Patient presented to ED with 2 months of mid epigastric abdominal pain.  She was seen by Dr. Stacia 12/2023 for the same and underwent EGD which showed 2 cm hiatal hernia and H. pylori positive gastritis s/p treatment with quadruple therapy.  She has not completed eradication study.  She completed this treatment 2 weeks ago.  H. pylori stool antigen test currently pending though she did complete this test while on pantoprazole .  She had a CT scan which showed a known large liver cyst.  During EGD she was found to have mild extrinsic compression in the gastric body secondary to this hepatic cyst.  Underwent IR drainage 10/28 in which 300 mL was removed from each cyst.    Once IR drainage yesterday she had significant improvement in pain and states this is the best she has felt over the last 2 months.  Karen [name] is at bedside and confirms.  PREVIOUS GI WORKUP   EGD 12/30/2023 for LUQ pain - Normal esophagus.  - Gastritis. Biopsied.  - Mild extrinsic compression in the gastric body ( anterior wall) likely related to large hepatic cyst .  - 2 cm hiatal hernia.  - Normal examined duodenum.  - No obvious cause of upper abdominal pain  1. 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       -  ABUNDANT HELICOBACTER PYLORI ORGANISMS ARE IDENTIFIED ON THE H&E-STAINED      SLIDE.        2. Surgical [P], gastric body :       -  OXYNTIC MUCOSA WITH MODERATE CHRONIC MILDLY ACTIVE  HELICOBACTER ASSOCIATED       GASTRITIS.       -  ABUNDANT HELICOBACTER PYLORI ORGANISMS ARE IDENTIFIED ON THE H&E-STAINED      SLIDE.   CT December 15, 2023 IMPRESSION: 1. Negative for acute aortic dissection or aneurysm. 2. No CT evidence for acute intra-abdominal or pelvic abnormality. 3. 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 5. Aortic atherosclerosis.   Aortic Atherosclerosis (ICD10-I70.0).     Lipase level 68 in the ER.     Ultrasound abdomen 2023 IMPRESSION: Multiple cysts within the liver.   Past Medical History:  Diagnosis Date   Hypertension     Surgical History:  She  has a past surgical history that includes Cataract extraction, bilateral and Eye surgery. Family History:  Karen [name] family history includes Hyperlipidemia in Karen [name] mother; Hypertension in Karen [name] father and mother. Social History:   reports that she has never smoked. She has never used smokeless tobacco. She reports that she does not drink alcohol and does not use drugs.  Prior to Admission medications   Medication Sig Start Date End Date Taking? Authorizing Provider  cyclobenzaprine  (FLEXERIL ) 5 MG tablet Take 1 tablet (5 mg total) by  mouth 2 (two) times daily as needed for muscle spasms. 09/05/22  Yes Mayer, Jodi R, NP  losartan -hydrochlorothiazide (HYZAAR) 100-25 MG tablet TAKE 1 TABLET BY MOUTH DAILY 02/10/22  Yes Thedora Garnette HERO, MD  meclizine  (ANTIVERT ) 12.5 MG tablet Take 1 tablet (12.5 mg total) by mouth 3 (three) times daily as needed for dizziness. 09/18/20  Yes Cirigliano, Mary K, DO  Na Sulfate-K Sulfate-Mg Sulfate concentrate (SUPREP) 17.5-3.13-1.6 GM/177ML SOLN Take 1 kit by mouth once. 12/27/23  Yes [provider]  oxyCODONE -acetaminophen  (PERCOCET/ROXICET) 5-325 MG tablet Take 1 tablet by mouth every 6 (six) hours as needed for severe pain (pain score 7-10). 12/15/23  Yes Tegeler, Lonni PARAS, MD  SUTAB  [phone] MG  TABS Take 24 tablets by mouth once. 12/28/23  Yes [provider]  omeprazole  (PRILOSEC) 20 MG capsule Take 1 capsule (20 mg total) by mouth 2 (two) times daily for 14 days. 01/09/24 01/23/24  Stacia Glendia BRAVO, MD    Current Facility-Administered Medications  Medication Dose Route Frequency Provider Last Rate Last Admin   bisacodyl (DULCOLAX) EC tablet 5 mg  5 mg Oral Daily PRN Arthea Child, MD       feeding supplement (ENSURE PLUS HIGH PROTEIN) liquid 237 mL  237 mL Oral BID BM Regalado, Belkys A, MD       hyaluronidase Human (HYLENEX) injection 150 Units  150 Units Subcutaneous Once Claiborne, Claudia, MD       HYDROmorphone (DILAUDID) injection 1 mg  1 mg Intravenous Q4H PRN Claiborne, Claudia, MD   1 mg at 02/14/24 1301   meclizine  (ANTIVERT ) tablet 12.5 mg  12.5 mg Oral TID PRN Krishnan, Gokul, MD       nystatin (MYCOSTATIN) 100000 UNIT/ML suspension 500,000 Units  5 mL Oral QID Regalado, Belkys A, MD       ondansetron  (ZOFRAN ) tablet 4 mg  4 mg Oral Q6H PRN Arthea Child, MD       Or   ondansetron  (ZOFRAN ) injection 4 mg  4 mg Intravenous Q6H PRN Arthea Child, MD       pantoprazole  (PROTONIX ) EC tablet 40 mg  40 mg Oral Daily Regalado, Belkys A, MD       sodium chloride  flush (NS) 0.9 % injection 3 mL  3 mL Intravenous Q12H Arthea Child, MD   3 mL at 02/15/24 0929   traMADol (ULTRAM) tablet 50 mg  50 mg Oral Q8H PRN Regalado, Belkys A, MD        Allergies as of 02/12/2024   (No Known Allergies)    Review of Systems  Constitutional:  Negative for chills, fever and weight loss.  HENT:  Negative for hearing loss and tinnitus.   Eyes:  Negative for blurred vision and double vision.  Respiratory:  Negative for cough and hemoptysis.   Cardiovascular:  Negative for chest pain and palpitations.  Gastrointestinal:  Negative for abdominal pain, blood in stool, constipation, diarrhea, heartburn, melena, nausea and vomiting.  Genitourinary:  Negative for  dysuria and urgency.  Musculoskeletal:  Negative for myalgias and neck pain.  Skin:  Negative for itching and rash.  Neurological:  Negative for seizures and loss of consciousness.  Psychiatric/Behavioral:  Negative for depression and suicidal ideas.        Physical Exam:  Vital signs in last 24 hours: Temp:  [98 F (36.7 C)-98.8 F (37.1 C)] 98 F (36.7 C) (10/29 0558) Pulse Rate:  [62-77] 67 (10/29 0558) Resp:  [10-16] 16 (10/29 0558) BP: (128-172)/(57-81) 142/66 (10/29 0558) SpO2:  [  96 %-100 %] 97 % (10/29 0906) Last BM Date : 02/14/24 Last BM recorded by nurses in past 5 days No data recorded  Physical Exam Constitutional:      Appearance: She is well-developed. She is not ill-appearing.  HENT:     Nose: Nose normal. No congestion.     Mouth/Throat:     Mouth: Mucous membranes are moist.  Eyes:     General: No scleral icterus.    Conjunctiva/sclera: Conjunctivae normal.  Cardiovascular:     Rate and Rhythm: Normal rate and regular rhythm.  Pulmonary:     Effort: Pulmonary effort is normal. No respiratory distress.  Abdominal:     General: Bowel sounds are normal. There is no distension.     Palpations: Abdomen is soft. There is no mass.     Tenderness: There is no abdominal tenderness. There is no guarding.     Hernia: No hernia is present.  Musculoskeletal:        General: No swelling. Normal range of motion.     Cervical back: Normal range of motion and neck supple.  Skin:    General: Skin is warm and dry.  Neurological:     General: No focal deficit present.     Mental Status: She is alert and oriented to person, place, and time.  Psychiatric:        Mood and Affect: Mood normal.        Behavior: Behavior normal.        Thought Content: Thought content normal.        Judgment: Judgment normal.      LAB RESULTS: Recent Labs    02/13/24 0451 02/14/24 0447 02/15/24 0511  WBC 8.0 7.2 5.8  HGB 11.0* 10.8* 10.7*  HCT 36.0 33.7* 35.0*  PLT 201 171 190    BMET Recent Labs    02/13/24 0451 02/14/24 0447 02/15/24 0511  NA 138 137 137  K 3.8 4.0 4.5  CL 103 101 102  CO2 27 29 29   GLUCOSE 105* 93 95  BUN 26* 20 21  CREATININE 1.04* 0.84 0.76  CALCIUM 9.3 9.2 9.1   LFT Recent Labs    02/14/24 0447  PROT 7.1  ALBUMIN 3.4*  AST 26  ALT 18  ALKPHOS 87  BILITOT 0.4   PT/INR Recent Labs    02/13/24 1301  LABPROT 14.6  INR 1.1    STUDIES: US  FINE NEEDLE ASP 1ST LESION Result Date: 02/14/2024 INDICATION: 83 year old with abdominal pain and multiple large hepatic cysts. EXAM: ULTRASOUND-GUIDED ASPIRATION OF HEPATIC CYSTS MEDICATIONS: Moderate sedation ANESTHESIA/SEDATION: Moderate (conscious) sedation was employed during this procedure. A total of Versed 2 mg and fentanyl 100 mcg was administered intravenously at the order of the provider performing the procedure. Total intra-service moderate sedation time: 24 minutes. Patient's level of consciousness and vital signs were monitored continuously by radiology nurse throughout the procedure under the supervision of the provider performing the procedure. COMPLICATIONS: None immediate. PROCEDURE: Informed written consent was obtained from the patient after a thorough discussion of the procedural risks, benefits and alternatives. All questions were addressed. A timeout was performed prior to the initiation of the procedure. Large cysts were identified in the anterior abdomen and involving the left hepatic lobe. Anterior abdomen was prepped with chlorhexidine and sterile field was created. Skin was anesthetized with 1% lidocaine. Using ultrasound guidance, a Yueh catheter was directed into the largest left hepatic cyst. 300 mL of clear yellow fluid was removed. Fluid was sent for  culture and cytology. Ultrasound demonstrated a large residual cyst in this area suggesting there are probably 2 separate cysts. A new Yueh catheter was directed into the other cyst using ultrasound guidance and another  300 mL of clear yellow fluid was removed. Bandage placed over the puncture site. FINDINGS: Multiple large cysts in the liver. Largest cystic structure in the left hepatic lobe appears to represent 2 cysts. Two separate aspirations were performed and a total of 600 mL of fluid was removed. IMPRESSION: Successful ultrasound-guided aspiration of large left hepatic cysts. Total of 600 mL of fluid was removed. Electronically Signed   By: Juliene Balder M.D.   On: 02/14/2024 18:33   US  FINE NEEDLE ASP EA ADDL LESION Result Date: 02/14/2024 INDICATION: 83 year old with abdominal pain and multiple large hepatic cysts. EXAM: ULTRASOUND-GUIDED ASPIRATION OF HEPATIC CYSTS MEDICATIONS: Moderate sedation ANESTHESIA/SEDATION: Moderate (conscious) sedation was employed during this procedure. A total of Versed 2 mg and fentanyl 100 mcg was administered intravenously at the order of the provider performing the procedure. Total intra-service moderate sedation time: 24 minutes. Patient's level of consciousness and vital signs were monitored continuously by radiology nurse throughout the procedure under the supervision of the provider performing the procedure. COMPLICATIONS: None immediate. PROCEDURE: Informed written consent was obtained from the patient after a thorough discussion of the procedural risks, benefits and alternatives. All questions were addressed. A timeout was performed prior to the initiation of the procedure. Large cysts were identified in the anterior abdomen and involving the left hepatic lobe. Anterior abdomen was prepped with chlorhexidine and sterile field was created. Skin was anesthetized with 1% lidocaine. Using ultrasound guidance, a Yueh catheter was directed into the largest left hepatic cyst. 300 mL of clear yellow fluid was removed. Fluid was sent for culture and cytology. Ultrasound demonstrated a large residual cyst in this area suggesting there are probably 2 separate cysts. A new Yueh catheter was  directed into the other cyst using ultrasound guidance and another 300 mL of clear yellow fluid was removed. Bandage placed over the puncture site. FINDINGS: Multiple large cysts in the liver. Largest cystic structure in the left hepatic lobe appears to represent 2 cysts. Two separate aspirations were performed and a total of 600 mL of fluid was removed. IMPRESSION: Successful ultrasound-guided aspiration of large left hepatic cysts. Total of 600 mL of fluid was removed. Electronically Signed   By: Juliene Balder M.D.   On: 02/14/2024 18:33      Impression    Upper abdominal pain Large Liver cysts H. pylori gastritis, completed quadruple therapy 2 weeks ago CTAP with multiple liver cysts with largest at 11 cm, s/p IR drainage 10/28 removing from each cyst Pain has resolved. Feeling better today. Pain was likely secondary to large liver cysts now s/p drainage. Cytology pending.  History of H. pylori History of H. pylori gastritis 12/30/2023 s/p quadruple therapy - H. pylori stool antigen pending to confirm eradication, note this was completed while on pantoprazole   Anemia Hgb 11.8 - 2 months ago (now 10.7) Iron 24, saturation 9%, ferritin 126 B12 737 Normal kidney function No previous colonoscopy, no overt bleeding. No change in bowel habits. Reassuring CT. If GI symptoms develop could consider colonoscopy as an outpatient although Karen [name] age would be comorbidities she does appear significantly younger than Karen [name] stated age   Plan   - Await pending H. pylori stool antigen results though appears patient was on PPI at the time of this test - If pain returns, consider  repeat abdominal ultrasound -- follow fluid cytology -- continue supportive care  Thank you for your kind consultation, we will continue to follow.   Bayley CHRISTELLA Blower  02/15/2024, 10:58 AM     Attending physician's note   I have taken history, reviewed the chart and examined the patient. I performed a substantive portion  of this encounter, including complete performance of at least one of the key components, in conjunction with the APP. I agree with the Advanced Practitioner's note, impression and recommendations.   LUQ abdo pain - resolved after large L hepatic cyst aspiration yesterday- likely d/t capsular distention.  Studies-P. Neg EGD. H/O HP gastritis 12/2023 s/p quadruple therapy IDA - Hb 11 (was 12.6 2 yrs ago)  Plan: - Follow cytology (from cyst aspirate) - Follow stool HP antigen - FU GI clinic as outpt.  If IDA is persistent, can always consider colon as outpt. - No plans for any endoscopic procedures while she is here.  Will sign off for now.   Anselm Bring, MD [facility] [phone]

## 2024-02-15 NOTE — Plan of Care (Signed)

## 2024-02-16 DIAGNOSIS — R1085 Abdominal pain of multiple sites: Secondary | ICD-10-CM | POA: Diagnosis not present

## 2024-02-16 LAB — BASIC METABOLIC PANEL WITH GFR
Anion gap: 16 — ABNORMAL HIGH (ref 5–15)
BUN: 19 mg/dL (ref 8–23)
CO2: 20 mmol/L — ABNORMAL LOW (ref 22–32)
Calcium: 9.8 mg/dL (ref 8.9–10.3)
Chloride: 101 mmol/L (ref 98–111)
Creatinine, Ser: 0.76 mg/dL (ref 0.44–1.00)
GFR, Estimated: >60 mL/min (ref >60)
Glucose, Bld: 101 mg/dL — ABNORMAL HIGH (ref 70–99)
Potassium: 4 mmol/L (ref 3.5–5.1)
Sodium: 138 mmol/L (ref 135–145)

## 2024-02-16 LAB — GLUCOSE, CAPILLARY: Glucose-Capillary: 107 mg/dL — ABNORMAL HIGH (ref 70–99)

## 2024-02-16 LAB — MAGNESIUM: Magnesium: 2.2 mg/dL (ref 1.7–2.4)

## 2024-02-16 LAB — H. PYLORI ANTIGEN, STOOL: H. Pylori Stool Ag, Eia: NEGATIVE

## 2024-02-16 MED ORDER — FERROUS SULFATE 325 (65 FE) MG PO TABS: 325.0000 mg | ORAL_TABLET | Freq: Every day | ORAL | 3 refills | Status: DC

## 2024-02-16 MED ORDER — HYDROCHLOROTHIAZIDE 25 MG PO TABS
25.0000 mg | ORAL_TABLET | Freq: Every day | ORAL | Status: DC
  Administered 2024-02-16 – 2024-02-17 (×2): 25 mg via ORAL
  Filled 2024-02-16 (×2): qty 1

## 2024-02-16 MED ORDER — HYDRALAZINE HCL 20 MG/ML IJ SOLN
10.0000 mg | Freq: Once | INTRAMUSCULAR | Status: AC
  Administered 2024-02-16: 10 mg via INTRAVENOUS
  Filled 2024-02-16: qty 1

## 2024-02-16 MED ORDER — LOSARTAN POTASSIUM 50 MG PO TABS
50.0000 mg | ORAL_TABLET | Freq: Once | ORAL | Status: DC
Start: 2024-02-16 — End: 2024-02-17

## 2024-02-16 MED ORDER — PANTOPRAZOLE SODIUM 40 MG PO TBEC: 40.0000 mg | DELAYED_RELEASE_TABLET | Freq: Every day | ORAL | 1 refills | Status: DC

## 2024-02-16 MED ORDER — NYSTATIN 100000 UNIT/ML MT SUSP: 5.0000 mL | Freq: Four times a day (QID) | OROMUCOSAL | 0 refills | Status: DC

## 2024-02-16 MED ORDER — ACETAMINOPHEN 325 MG PO TABS
650.0000 mg | ORAL_TABLET | Freq: Four times a day (QID) | ORAL | Status: DC | PRN
  Administered 2024-02-16: 650 mg via ORAL
  Filled 2024-02-16: qty 2

## 2024-02-16 NOTE — Progress Notes (Signed)
 PN follow up:  Patient BP has been elevated today. Her hydrochlorothiazide was resume at noon. She has been on half dose of cozaar  since yesterday. BP remain elevated this afternoon at 177. IV hydralzine ordered order one time dose. Patient develops tingling and needle sensation in her hands and feet , and anxiety one hour after hydralazine  given.   I came to evaluated patient. Repeated BP was 160. Plan to monitor for now. She will get rest dose of cozaar  later.  She was alert and conversant, following command. Her neuro exam was normal. Motor strength 5/5 throughout. Sensation normal. She report feeling nervous and not feeling well, she was crying. Subsequently tingling of hands resolved. Still feeling heaviness in her  forefeet.  Plan to keep overnight. Added Hydralazine  to allergies, adverse reaction. Will check B-met and Mg to evaluate for abnormal electrolytes. Daughter Updated.   Owen Lore, MD.

## 2024-02-16 NOTE — Plan of Care (Signed)

## 2024-02-16 NOTE — Discharge Summary (Signed)
 Physician Discharge Summary   Patient: Karen [name] MRN: [id number] DOB: [birth year]  Admit date:     02/12/2024  Discharge date: 02/16/24  Discharge Physician: Owen A Elye Harmsen   PCP: Pcp, No   Recommendations at discharge:    Follow up cytology result of cyst aspiration Follow up with GI for abdominal pain   Discharge Diagnoses: Principal Problem:   Abdominal pain Active Problems:   Alopecia   Osteoporosis   Cystic disease of liver   H. pylori infection  Resolved Problems:   * No resolved hospital problems. *  Hospital Course: 83 year old with past medical history significant for 40-month history of severe midepigastric abdominal pain. Patient follows with [facility] as an outpatient. She did have an endoscopy and was found to have H. pylori, for which she completed treatment 2 weeks prior to admission. Further evaluation CT scan showed a large liver cyst. Per family she was going to be referred to general surgery to have the cyst removed. Patient present with abdominal pain 10 out of 10 and dizziness.   Assessment and Plan: 1-Acute on chronic abdominal pain/hepatic cysts: - Patient with acute on chronic abdominal pain ongoing for the last 2-1/74-month. - Underwent endoscopy 2 weeks ago and found to have H. pylori positive and completed treatment. - CT showed large hepatic cyst. - General Surgery consulted and recommended IR consult for aspiration and or a sclerotherapy. -underwent Liver cyst aspiration, yielding 300 cc from each cyst on 10/29. She report some improvement of abdominal pain. Pain down to 9/10/ does relates she is able to breath better and move better.  GI have been consulted.  She is feeling better, pain has improved. Plan to discharge home     Hypokalemia: -Replaced.    Normocytic anemia: Iron deficiency -iron deficiency.  -started  iron supplement.    Essential hypertension: BP elevated, resume home dose  losartan  and hydrochlorothiazide.      Oral Thrush; discharge on  Nystatin.    Estimated body mass index is 25.79 kg/m as calculated from the following:   Height as of this encounter: 5' 5 (1.651 m).   Weight as of this encounter: 70.3 kg.            Consultants: GI, Surgery  Procedures performed:none Disposition: Home Diet recommendation:  Discharge Diet Orders (From admission, onward)     Start     Ordered   02/16/24 0000  Diet - low sodium heart healthy        02/16/24 1106           Cardiac diet DISCHARGE MEDICATION: Allergies as of 02/16/2024   No Known Allergies      Medication List     STOP taking these medications    cyclobenzaprine  5 MG tablet Commonly known as: FLEXERIL    Na Sulfate-K Sulfate-Mg Sulfate concentrate 17.5-3.13-1.6 GM/177ML Soln Commonly known as: SUPREP   omeprazole  20 MG capsule Commonly known as: PRILOSEC   Sutab  1479-225-188 MG Tabs Generic drug: Sodium Sulfate-Mag Sulfate-KCl       TAKE these medications    ferrous sulfate 325 (65 FE) MG tablet Take 1 tablet (325 mg total) by mouth daily with breakfast. Start taking on: February 17, 2024   losartan -hydrochlorothiazide 100-25 MG tablet Commonly known as: HYZAAR TAKE 1 TABLET BY MOUTH DAILY   meclizine  12.5 MG tablet Commonly known as: ANTIVERT  Take 1 tablet (12.5 mg total) by mouth 3 (three) times daily as needed for dizziness.   nystatin 100000 UNIT/ML suspension Commonly known as:  MYCOSTATIN Take 5 mLs (500,000 Units total) by mouth 4 (four) times daily.   oxyCODONE -acetaminophen  5-325 MG tablet Commonly known as: PERCOCET/ROXICET Take 1 tablet by mouth every 6 (six) hours as needed for severe pain (pain score 7-10).   pantoprazole  40 MG tablet Commonly known as: PROTONIX  Take 1 tablet (40 mg total) by mouth daily. Start taking on: February 17, 2024        Discharge Exam: Karen [name]   02/12/24 1122  Weight: 70.3 kg   General; NAD  Condition at discharge: stable  The results of  significant diagnostics from this hospitalization (including imaging, microbiology, ancillary and laboratory) are listed below for reference.   Imaging Studies: US  FINE NEEDLE ASP 1ST LESION Result Date: 02/14/2024 INDICATION: 83 year old with abdominal pain and multiple large hepatic cysts. EXAM: ULTRASOUND-GUIDED ASPIRATION OF HEPATIC CYSTS MEDICATIONS: Moderate sedation ANESTHESIA/SEDATION: Moderate (conscious) sedation was employed during this procedure. A total of Versed 2 mg and fentanyl 100 mcg was administered intravenously at the order of the provider performing the procedure. Total intra-service moderate sedation time: 24 minutes. Patient's level of consciousness and vital signs were monitored continuously by radiology nurse throughout the procedure under the supervision of the provider performing the procedure. COMPLICATIONS: None immediate. PROCEDURE: Informed written consent was obtained from the patient after a thorough discussion of the procedural risks, benefits and alternatives. All questions were addressed. A timeout was performed prior to the initiation of the procedure. Large cysts were identified in the anterior abdomen and involving the left hepatic lobe. Anterior abdomen was prepped with chlorhexidine and sterile field was created. Skin was anesthetized with 1% lidocaine. Using ultrasound guidance, a Yueh catheter was directed into the largest left hepatic cyst. 300 mL of clear yellow fluid was removed. Fluid was sent for culture and cytology. Ultrasound demonstrated a large residual cyst in this area suggesting there are probably 2 separate cysts. A new Yueh catheter was directed into the other cyst using ultrasound guidance and another 300 mL of clear yellow fluid was removed. Bandage placed over the puncture site. FINDINGS: Multiple large cysts in the liver. Largest cystic structure in the left hepatic lobe appears to represent 2 cysts. Two separate aspirations were performed and a  total of 600 mL of fluid was removed. IMPRESSION: Successful ultrasound-guided aspiration of large left hepatic cysts. Total of 600 mL of fluid was removed. Electronically Signed   By: Juliene Balder M.D.   On: 02/14/2024 18:33   US  FINE NEEDLE ASP EA ADDL LESION Result Date: 02/14/2024 INDICATION: 83 year old with abdominal pain and multiple large hepatic cysts. EXAM: ULTRASOUND-GUIDED ASPIRATION OF HEPATIC CYSTS MEDICATIONS: Moderate sedation ANESTHESIA/SEDATION: Moderate (conscious) sedation was employed during this procedure. A total of Versed 2 mg and fentanyl 100 mcg was administered intravenously at the order of the provider performing the procedure. Total intra-service moderate sedation time: 24 minutes. Patient's level of consciousness and vital signs were monitored continuously by radiology nurse throughout the procedure under the supervision of the provider performing the procedure. COMPLICATIONS: None immediate. PROCEDURE: Informed written consent was obtained from the patient after a thorough discussion of the procedural risks, benefits and alternatives. All questions were addressed. A timeout was performed prior to the initiation of the procedure. Large cysts were identified in the anterior abdomen and involving the left hepatic lobe. Anterior abdomen was prepped with chlorhexidine and sterile field was created. Skin was anesthetized with 1% lidocaine. Using ultrasound guidance, a Yueh catheter was directed into the largest left hepatic cyst. 300 mL of clear  yellow fluid was removed. Fluid was sent for culture and cytology. Ultrasound demonstrated a large residual cyst in this area suggesting there are probably 2 separate cysts. A new Yueh catheter was directed into the other cyst using ultrasound guidance and another 300 mL of clear yellow fluid was removed. Bandage placed over the puncture site. FINDINGS: Multiple large cysts in the liver. Largest cystic structure in the left hepatic lobe appears to  represent 2 cysts. Two separate aspirations were performed and a total of 600 mL of fluid was removed. IMPRESSION: Successful ultrasound-guided aspiration of large left hepatic cysts. Total of 600 mL of fluid was removed. Electronically Signed   By: Juliene Balder M.D.   On: 02/14/2024 18:33   CT ABDOMEN PELVIS W CONTRAST Result Date: 02/12/2024 CLINICAL DATA:  Acute abdominal pain. Patient reports continued and worsening left upper abdominal pain. EXAM: CT ABDOMEN AND PELVIS WITH CONTRAST TECHNIQUE: Multidetector CT imaging of the abdomen and pelvis was performed using the standard protocol following bolus administration of intravenous contrast. RADIATION DOSE REDUCTION: This exam was performed according to the departmental dose-optimization program which includes automated exposure control, adjustment of the mA and/or kV according to patient size and/or use of iterative reconstruction technique. CONTRAST:  OMNIPAQUE  IOHEXOL  300 MG/ML  SOLN COMPARISON:  12/15/2023 FINDINGS: Lower chest: Subsegmental atelectasis in the right lower lobe. No pleural effusion. Small hiatal hernia. Hepatobiliary: Numerous cysts in the liver of varying sizes. Largest cyst is within the left lobe of the liver and spans greater than 11 cm transverse and is mildly exophytic. No evidence of solid liver lesion. Partially distended gallbladder, no biliary dilatation. Pancreas: Fatty atrophy. No ductal dilatation or inflammation. No evidence of focal lesion. Spleen: Normal in size without focal abnormality. Adrenals/Urinary Tract: No adrenal nodule. No hydronephrosis or perinephric edema. Homogeneous renal enhancement with symmetric excretion on delayed phase imaging. Left renal cyst. No further follow-up imaging is recommended. Urinary bladder is completely decompressed. Stomach/Bowel: Small hiatal hernia. The stomach is nondistended. No bowel obstruction or inflammatory change. Sigmoid colonic diverticulosis without diverticulitis.  Normal appendix. Vascular/Lymphatic: Aortic atherosclerosis. No aortic aneurysm. No acute vascular findings. No abdominopelvic adenopathy. Reproductive: Uterus and bilateral adnexa are unremarkable. Other: No free air, free fluid, or intra-abdominal fluid collection. Small fat containing umbilical hernia. Musculoskeletal: Levo scoliotic curvature of the spine. Multilevel facet hypertrophy in the spine. There are no acute or suspicious osseous abnormalities. IMPRESSION: 1. No acute abnormality or explanation for abdominal pain. 2. Numerous cysts in the liver of varying sizes. Largest cyst is within the left lobe of the liver and spans greater than 11 cm transverse and is mildly exophytic. Capsular stretching could potentially be a source of left-sided pain. Overall, no change from prior exam. 3. Sigmoid colonic diverticulosis without diverticulitis. 4. Small hiatal hernia. Aortic Atherosclerosis (ICD10-I70.0). Electronically Signed   By: Andrea Gasman M.D.   On: 02/12/2024 15:27    Microbiology: Results for orders placed or performed during the hospital encounter of 02/12/24  Aerobic/Anaerobic Culture w Gram Stain (surgical/deep wound)     Status: None (Preliminary result)   Collection Time: 02/14/24  4:18 PM   Specimen: Cyst; Body Fluid  Result Value Ref Range Status   Specimen Description   Final    CYSTS Performed at [facility], [address] [place], [place] [address]    Special Requests   Final    NONE Performed at [facility], [address], [place], [place] [address]    Gram  Stain NO WBC SEEN NO ORGANISMS SEEN   Final   Culture   Final    NO GROWTH 2 DAYS Performed at [facility], [address], [place], [place] [address]    Report Status PENDING  Incomplete    Labs: CBC: Recent Labs  Lab 02/12/24 1200 02/13/24 0451 02/14/24 0447 02/15/24 0511  WBC 7.8 8.0 7.2 5.8  HGB 11.7* 11.0* 10.8* 10.7*  HCT 37.2 36.0 33.7* 35.0*   MCV 87.5 88.2 87.8 88.4  PLT 200 201 171 190   Basic Metabolic Panel: Recent Labs  Lab 02/12/24 1200 02/13/24 0451 02/14/24 0447 02/15/24 0511  NA 138 138 137 137  K 3.2* 3.8 4.0 4.5  CL 99 103 101 102  CO2 28 27 29 29   GLUCOSE 110* 105* 93 95  BUN 18 26* 20 21  CREATININE 0.98 1.04* 0.84 0.76  CALCIUM 10.0 9.3 9.2 9.1  MG  --  2.2  --   --    Liver Function Tests: Recent Labs  Lab 02/12/24 1200 02/13/24 0451 02/14/24 0447  AST 25 25 26   ALT 18 16 18   ALKPHOS 89 88 87  BILITOT 0.6 0.5 0.4  PROT 7.8 7.1 7.1  ALBUMIN 3.9 3.5 3.4*   CBG: No results for input(s): GLUCAP in the last 168 hours.  Discharge time spent: greater than 30 minutes.  Signed: Owen DELENA Lore, MD [facility] 02/16/2024

## 2024-02-16 NOTE — Progress Notes (Signed)
 Patient having pins and needles sensation in her feet about an hour after given hydralazine  IVto bring blood pressure down. Patient tearful and having anxiety. Used deep breathing techniques and family support on the phone to support her. MD Regalado aware of the situation.

## 2024-02-17 DIAGNOSIS — R1085 Abdominal pain of multiple sites: Secondary | ICD-10-CM | POA: Diagnosis not present

## 2024-02-17 NOTE — Discharge Summary (Signed)
 Physician Discharge Summary   Patient: Karen [name] MRN: [id number] DOB: 09-13-1940  Admit date:     02/12/2024  Discharge date: 02/17/24  Discharge Physician: Karen [name]   PCP: Karen [name]   Recommendations at discharge:    Follow up cytology result of cyst aspiration Follow up with GI for abdominal pain  Follow for further management of HTN  Discharge Diagnoses: Principal Problem:   Abdominal pain Active Problems:   Alopecia   Osteoporosis   Cystic disease of liver   H. pylori infection  Resolved Problems:   * [name] resolved hospital problems. *  Hospital Course: 83 year old with past medical history significant for 41-month history of severe midepigastric abdominal pain. Patient follows with [facility] as an outpatient. She did have an endoscopy and was found to have H. pylori, for which she completed treatment 2 weeks prior to admission. Further evaluation CT scan showed a large liver cyst. Per family she was going to be referred to general surgery to have the cyst removed. Patient present with abdominal pain 10 out of 10 and dizziness.   Assessment and Plan: 1-Acute on chronic abdominal pain/hepatic cysts: - Patient with acute on chronic abdominal pain ongoing for the last 2-1/14-month. - Underwent endoscopy 2 weeks ago and found to have H. pylori positive and completed treatment. - CT showed large hepatic cyst. - General Surgery consulted and recommended IR consult for aspiration and or a sclerotherapy. -underwent Liver cyst aspiration, yielding 300 cc from each cyst on 10/29. She report some improvement of abdominal pain. Pain down to 9/10/ does relates she is able to breath better and move better.  GI have been consulted.  Abdominal pain resolved.  Follow up cytology report.    Hypokalemia: -Replaced.    Normocytic anemia: Iron deficiency -iron deficiency.  -started  iron supplement.    Essential hypertension: Resume  losartan  and hydrochlorothiazide.    Paresthesia:  She develops paresthesia feet and hands after IV hydralazine  on 10/30. She was also very anxious. Her symptoms resolved, improved.  Neuro exam non focal.   Oral Thrush; discharge on  Nystatin.    Estimated body mass index is 25.79 kg/m as calculated from the following:   Height as of this encounter: 5' 5 (1.651 m).   Weight as of this encounter: 70.3 kg.            Consultants: GI, Surgery  Procedures performed:none Disposition: Home Diet recommendation:  Discharge Diet Orders (From admission, onward)     Start     Ordered   02/16/24 0000  Diet - low sodium heart healthy        02/16/24 1106           Cardiac diet DISCHARGE MEDICATION: Allergies as of 02/17/2024       Reactions   Hydralazine     Report numbness and tingling hands and feet. Anxiety.         Medication List     STOP taking these medications    cyclobenzaprine  5 MG tablet Commonly known as: FLEXERIL    Na Sulfate-K Sulfate-Mg Sulfate concentrate 17.5-3.13-1.6 GM/177ML Soln Commonly known as: SUPREP   omeprazole  20 MG capsule Commonly known as: PRILOSEC   Sutab  1479-225-188 MG Tabs Generic drug: Sodium Sulfate-Mag Sulfate-KCl       TAKE these medications    ferrous sulfate 325 (65 FE) MG tablet Take 1 tablet (325 mg total) by mouth daily with breakfast.   losartan -hydrochlorothiazide 100-25 MG tablet Commonly known as: HYZAAR TAKE 1 TABLET  BY MOUTH DAILY   meclizine  12.5 MG tablet Commonly known as: ANTIVERT  Take 1 tablet (12.5 mg total) by mouth 3 (three) times daily as needed for dizziness.   nystatin 100000 UNIT/ML suspension Commonly known as: MYCOSTATIN Take 5 mLs (500,000 Units total) by mouth 4 (four) times daily.   oxyCODONE -acetaminophen  5-325 MG tablet Commonly known as: PERCOCET/ROXICET Take 1 tablet by mouth every 6 (six) hours as needed for severe pain (pain score 7-10).   pantoprazole  40 MG tablet Commonly known as: PROTONIX  Take 1  tablet (40 mg total) by mouth daily.        Follow-up Information     Karen [name], Karen V, DO Follow up in 3 week(s).   Specialty: Gastroenterology Contact information: [address] [place] [address] [phone]                Discharge Exam: Karen [name]   02/12/24 1122  Weight: 70.3 kg   General; NAD  Condition at discharge: stable  The results of significant diagnostics from this hospitalization (including imaging, microbiology, ancillary and laboratory) are listed below for reference.   Imaging Studies: US  FINE NEEDLE ASP 1ST LESION Result Date: 02/14/2024 INDICATION: 83 year old with abdominal pain and multiple large hepatic cysts. EXAM: ULTRASOUND-GUIDED ASPIRATION OF HEPATIC CYSTS MEDICATIONS: Moderate sedation ANESTHESIA/SEDATION: Moderate (conscious) sedation was employed during this procedure. A total of Versed 2 mg and fentanyl 100 mcg was administered intravenously at the order of the provider performing the procedure. Total intra-service moderate sedation time: 24 minutes. Patient's level of consciousness and vital signs were monitored continuously by radiology nurse throughout the procedure under the supervision of the provider performing the procedure. COMPLICATIONS: None immediate. PROCEDURE: Informed written consent was obtained from the patient after a thorough discussion of the procedural risks, benefits and alternatives. All questions were addressed. A timeout was performed prior to the initiation of the procedure. Large cysts were identified in the anterior abdomen and involving the left hepatic lobe. Anterior abdomen was prepped with chlorhexidine and sterile field was created. Skin was anesthetized with 1% lidocaine. Using ultrasound guidance, a Yueh catheter was directed into the largest left hepatic cyst. 300 mL of clear yellow fluid was removed. Fluid was sent for culture and cytology. Ultrasound demonstrated a large residual cyst in this area  suggesting there are probably 2 separate cysts. A new Yueh catheter was directed into the other cyst using ultrasound guidance and another 300 mL of clear yellow fluid was removed. Bandage placed over the puncture site. FINDINGS: Multiple large cysts in the liver. Largest cystic structure in the left hepatic lobe appears to represent 2 cysts. Two separate aspirations were performed and a total of 600 mL of fluid was removed. IMPRESSION: Successful ultrasound-guided aspiration of large left hepatic cysts. Total of 600 mL of fluid was removed. Electronically Signed   By: Juliene Balder M.D.   On: 02/14/2024 18:33   US  FINE NEEDLE ASP EA ADDL LESION Result Date: 02/14/2024 INDICATION: 83 year old with abdominal pain and multiple large hepatic cysts. EXAM: ULTRASOUND-GUIDED ASPIRATION OF HEPATIC CYSTS MEDICATIONS: Moderate sedation ANESTHESIA/SEDATION: Moderate (conscious) sedation was employed during this procedure. A total of Versed 2 mg and fentanyl 100 mcg was administered intravenously at the order of the provider performing the procedure. Total intra-service moderate sedation time: 24 minutes. Patient's level of consciousness and vital signs were monitored continuously by radiology nurse throughout the procedure under the supervision of the provider performing the procedure. COMPLICATIONS: None immediate. PROCEDURE: Informed written consent was obtained from the  patient after a thorough discussion of the procedural risks, benefits and alternatives. All questions were addressed. A timeout was performed prior to the initiation of the procedure. Large cysts were identified in the anterior abdomen and involving the left hepatic lobe. Anterior abdomen was prepped with chlorhexidine and sterile field was created. Skin was anesthetized with 1% lidocaine. Using ultrasound guidance, a Yueh catheter was directed into the largest left hepatic cyst. 300 mL of clear yellow fluid was removed. Fluid was sent for culture and  cytology. Ultrasound demonstrated a large residual cyst in this area suggesting there are probably 2 separate cysts. A new Yueh catheter was directed into the other cyst using ultrasound guidance and another 300 mL of clear yellow fluid was removed. Bandage placed over the puncture site. FINDINGS: Multiple large cysts in the liver. Largest cystic structure in the left hepatic lobe appears to represent 2 cysts. Two separate aspirations were performed and a total of 600 mL of fluid was removed. IMPRESSION: Successful ultrasound-guided aspiration of large left hepatic cysts. Total of 600 mL of fluid was removed. Electronically Signed   By: Juliene Balder M.D.   On: 02/14/2024 18:33   CT ABDOMEN PELVIS W CONTRAST Result Date: 02/12/2024 CLINICAL DATA:  Acute abdominal pain. Patient reports continued and worsening left upper abdominal pain. EXAM: CT ABDOMEN AND PELVIS WITH CONTRAST TECHNIQUE: Multidetector CT imaging of the abdomen and pelvis was performed using the standard protocol following bolus administration of intravenous contrast. RADIATION DOSE REDUCTION: This exam was performed according to the departmental dose-optimization program which includes automated exposure control, adjustment of the mA and/or kV according to patient size and/or use of iterative reconstruction technique. CONTRAST:  OMNIPAQUE  IOHEXOL  300 MG/ML  SOLN COMPARISON:  12/15/2023 FINDINGS: Lower chest: Subsegmental atelectasis in the right lower lobe. [name] pleural effusion. Small hiatal hernia. Hepatobiliary: Numerous cysts in the liver of varying sizes. Largest cyst is within the left lobe of the liver and spans greater than 11 cm transverse and is mildly exophytic. [name] evidence of solid liver lesion. Partially distended gallbladder, [name] biliary dilatation. Pancreas: Fatty atrophy. [name] ductal dilatation or inflammation. [name] evidence of focal lesion. Spleen: Normal in size without focal abnormality. Adrenals/Urinary Tract: [name] adrenal nodule.  [name] hydronephrosis or perinephric edema. Homogeneous renal enhancement with symmetric excretion on delayed phase imaging. Left renal cyst. [name] further follow-up imaging is recommended. Urinary bladder is completely decompressed. Stomach/Bowel: Small hiatal hernia. The stomach is nondistended. [name] bowel obstruction or inflammatory change. Sigmoid colonic diverticulosis without diverticulitis. Normal appendix. Vascular/Lymphatic: Aortic atherosclerosis. [name] aortic aneurysm. [name] acute vascular findings. [name] abdominopelvic adenopathy. Reproductive: Uterus and bilateral adnexa are unremarkable. Other: [name] free air, free fluid, or intra-abdominal fluid collection. Small fat containing umbilical hernia. Musculoskeletal: Levo scoliotic curvature of the spine. Multilevel facet hypertrophy in the spine. There are [name] acute or suspicious osseous abnormalities. IMPRESSION: 1. [name] acute abnormality or explanation for abdominal pain. 2. Numerous cysts in the liver of varying sizes. Largest cyst is within the left lobe of the liver and spans greater than 11 cm transverse and is mildly exophytic. Capsular stretching could potentially be a source of left-sided pain. Overall, [name] change from prior exam. 3. Sigmoid colonic diverticulosis without diverticulitis. 4. Small hiatal hernia. Aortic Atherosclerosis (ICD10-I70.0). Electronically Signed   By: Andrea Gasman M.D.   On: 02/12/2024 15:27    Microbiology: Results for orders placed or performed during the hospital encounter of 02/12/24  Aerobic/Anaerobic Culture w Gram Stain (surgical/deep wound)     Status:  None (Preliminary result)   Collection Time: 02/14/24  4:18 PM   Specimen: Cyst; Body Fluid  Result Value Ref Range Status   Specimen Description   Final    CYSTS Performed at [facility], [address], [place], [place] [address]    Special Requests   Final    NONE Performed at [facility], [address], [place], [place]  [address]    Gram Stain [name] WBC SEEN [name] ORGANISMS SEEN   Final   Culture   Final    [name] GROWTH 3 DAYS [name] ANAEROBES ISOLATED; CULTURE IN PROGRESS FOR 5 DAYS Performed at [facility], [address] [place], [place] [address]    Report Status PENDING  Incomplete    Labs: CBC: Recent Labs  Lab 02/12/24 1200 02/13/24 0451 02/14/24 0447 02/15/24 0511  WBC 7.8 8.0 7.2 5.8  HGB 11.7* 11.0* 10.8* 10.7*  HCT 37.2 36.0 33.7* 35.0*  MCV 87.5 88.2 87.8 88.4  PLT 200 201 171 190   Basic Metabolic Panel: Recent Labs  Lab 02/12/24 1200 02/13/24 0451 02/14/24 0447 02/15/24 0511 02/16/24 1732  NA 138 138 137 137 138  K 3.2* 3.8 4.0 4.5 4.0  CL 99 103 101 102 101  CO2 28 27 29 29  20*  GLUCOSE 110* 105* 93 95 101*  BUN 18 26* 20 21 19   CREATININE 0.98 1.04* 0.84 0.76 0.76  CALCIUM 10.0 9.3 9.2 9.1 9.8  MG  --  2.2  --   --  2.2   Liver Function Tests: Recent Labs  Lab 02/12/24 1200 02/13/24 0451 02/14/24 0447  AST 25 25 26   ALT 18 16 18   ALKPHOS 89 88 87  BILITOT 0.6 0.5 0.4  PROT 7.8 7.1 7.1  ALBUMIN 3.9 3.5 3.4*   CBG: Recent Labs  Lab 02/16/24 1603  GLUCAP 107*    Discharge time spent: greater than 30 minutes.  Signed: Owen DELENA Lore, MD [facility] 02/17/2024

## 2024-02-17 NOTE — Plan of Care (Signed)

## 2024-02-17 NOTE — Progress Notes (Signed)
 All questions and concerns answered prior to discharge. Patient leaving through hospital entrance via wheelchair with nurse aid, Lauren.

## 2024-02-19 LAB — AEROBIC/ANAEROBIC CULTURE W GRAM STAIN (SURGICAL/DEEP WOUND)
Culture: NO GROWTH
Gram Stain: NONE SEEN

## 2024-02-20 LAB — CYTOLOGY - NON PAP

## 2024-02-22 ENCOUNTER — Encounter: Payer: Self-pay | Admitting: Gastroenterology

## 2024-02-22 ENCOUNTER — Ambulatory Visit (INDEPENDENT_AMBULATORY_CARE_PROVIDER_SITE_OTHER): Admitting: Gastroenterology

## 2024-02-22 VITALS — BP 118/70 | HR 82 | Ht 65.0 in | Wt 156.0 lb

## 2024-02-22 DIAGNOSIS — Q446 Cystic disease of liver: Secondary | ICD-10-CM | POA: Diagnosis not present

## 2024-02-22 DIAGNOSIS — K2970 Gastritis, unspecified, without bleeding: Secondary | ICD-10-CM | POA: Diagnosis not present

## 2024-02-22 DIAGNOSIS — R1012 Left upper quadrant pain: Secondary | ICD-10-CM | POA: Diagnosis not present

## 2024-02-22 DIAGNOSIS — B9681 Helicobacter pylori [H. pylori] as the cause of diseases classified elsewhere: Secondary | ICD-10-CM

## 2024-02-22 DIAGNOSIS — K7689 Other specified diseases of liver: Secondary | ICD-10-CM

## 2024-02-22 NOTE — Progress Notes (Unsigned)
 HPI :  Karen [name] is a 83 y.o. female who presents for follow up of severe abdominal pain.  She was initially seen by me in the office September 9 with severe abdominal pain that been going on for several weeks.  CT in the emergency department August 28 that showed numerous large hepatic cysts (including 1 measuring 11 mm), but no other abnormalities.  Her lipase was slightly elevated at that time.  She had had an ultrasound from May 2023 which demonstrated hepatic cysts, including 1 measuring about 11 cm. An upper endoscopy was performed September 12 which showed diffuse gastric inflammation, with biopsies positive for H. pylori.  She was treated with quadruple therapy with plans to refer patient to surgery for potential treatment of cyst if her pain did not improve with H. pylori eradication. The patient continues to have severe pain, and presented to the ED October 26 and was admitted.  She was evaluated by surgery who recommended IR consultation who performed cyst aspiration.  2 cysts were drained with 300 mL of fluid removed from each cyst.  She was discharged on October 30. Cystic fluid analysis was notable for no malignant appearing cells, but there was a cellular mucin. H. pylori stool antigen from October 28 was negative.   Today, the patient reports that her abdominal pain is completely resolved.  She has no discomfort.  Her appetite is good.  She had lost weight due to decreased p.o. intake, but has since regained this weight already.  Her bowel movements are normal.  No problems with nausea or vomiting.  She is accompanied by her daughter in the office today.    CT Abdomen/pelvis with contrast Feb 12, 2024 Hepatobiliary: Numerous cysts in the liver of varying sizes. Largest cyst is within the left lobe of the liver and spans greater than 11 cm transverse and is mildly exophytic. No evidence of solid liver lesion. Partially distended gallbladder, no biliary  dilatation  IMPRESSION: 1. No acute abnormality or explanation for abdominal pain. 2. Numerous cysts in the liver of varying sizes. Largest cyst is within the left lobe of the liver and spans greater than 11 cm transverse and is mildly exophytic. Capsular stretching could potentially be a source of left-sided pain. Overall, no change from prior exam. 3. Sigmoid colonic diverticulosis without diverticulitis. 4. Small hiatal hernia.  CT December 15, 2023 Hepatobiliary: No calcified gallstone or biliary dilatation. Multiple large hepatic cysts. Left hepatic lobe cyst measures up to 11.3 cm.  IMPRESSION: 1. Negative for acute aortic dissection or aneurysm. 2. No CT evidence for acute intra-abdominal or pelvic abnormality. 3. 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 5. Aortic atherosclerosis.   Aortic Atherosclerosis (ICD10-I70.0).    Ultrasound abdomen May 2023 Liver: Multiple cysts are demonstrated throughout the liver largest measuring up to 10.7 cm. Portal vein is patent on color Doppler imaging with normal direction of blood flow towards the liver.  IMPRESSION: Multiple cysts within the liver.   No acute process.   Past Medical History:  Diagnosis Date   H. pylori infection    Hypertension      Past Surgical History:  Procedure Laterality Date   CATARACT EXTRACTION, BILATERAL     ESOPHAGOGASTRODUODENOSCOPY     EYE SURGERY     on her retina   liver cyst aspiration  01/2024   Family History  Problem Relation Age of Onset   Hypertension Mother    Hyperlipidemia Mother  Hypertension Father    Colon cancer Neg Hx    Esophageal cancer Neg Hx    Rectal cancer Neg Hx    Stomach cancer Neg Hx    Social History   Tobacco Use   Smoking status: Never   Smokeless tobacco: Never  Vaping Use   Vaping status: Never Used  Substance Use Topics   Alcohol use: No   Drug use: No   Current  Outpatient Medications  Medication Sig Dispense Refill   losartan -hydrochlorothiazide (HYZAAR) 100-25 MG tablet TAKE 1 TABLET BY MOUTH DAILY 90 tablet 0   meclizine  (ANTIVERT ) 12.5 MG tablet Take 1 tablet (12.5 mg total) by mouth 3 (three) times daily as needed for dizziness. 60 tablet 0   nystatin (MYCOSTATIN) 100000 UNIT/ML suspension Take 5 mLs (500,000 Units total) by mouth 4 (four) times daily. 60 mL 0   No current facility-administered medications for this visit.   Allergies  Allergen Reactions   Hydralazine      Report numbness and tingling hands and feet. Anxiety.      Review of Systems: All systems reviewed and negative except where noted in HPI.    US  FINE NEEDLE ASP 1ST LESION Result Date: 02/14/2024 INDICATION: 83 year old with abdominal pain and multiple large hepatic cysts. EXAM: ULTRASOUND-GUIDED ASPIRATION OF HEPATIC CYSTS MEDICATIONS: Moderate sedation ANESTHESIA/SEDATION: Moderate (conscious) sedation was employed during this procedure. A total of Versed 2 mg and fentanyl 100 mcg was administered intravenously at the order of the provider performing the procedure. Total intra-service moderate sedation time: 24 minutes. Patient's level of consciousness and vital signs were monitored continuously by radiology nurse throughout the procedure under the supervision of the provider performing the procedure. COMPLICATIONS: None immediate. PROCEDURE: Informed written consent was obtained from the patient after a thorough discussion of the procedural risks, benefits and alternatives. All questions were addressed. A timeout was performed prior to the initiation of the procedure. Large cysts were identified in the anterior abdomen and involving the left hepatic lobe. Anterior abdomen was prepped with chlorhexidine and sterile field was created. Skin was anesthetized with 1% lidocaine. Using ultrasound guidance, a Yueh catheter was directed into the largest left hepatic cyst. 300 mL of clear  yellow fluid was removed. Fluid was sent for culture and cytology. Ultrasound demonstrated a large residual cyst in this area suggesting there are probably 2 separate cysts. A new Yueh catheter was directed into the other cyst using ultrasound guidance and another 300 mL of clear yellow fluid was removed. Bandage placed over the puncture site. FINDINGS: Multiple large cysts in the liver. Largest cystic structure in the left hepatic lobe appears to represent 2 cysts. Two separate aspirations were performed and a total of 600 mL of fluid was removed. IMPRESSION: Successful ultrasound-guided aspiration of large left hepatic cysts. Total of 600 mL of fluid was removed. Electronically Signed   By: Juliene Balder M.D.   On: 02/14/2024 18:33   US  FINE NEEDLE ASP EA ADDL LESION Result Date: 02/14/2024 INDICATION: 83 year old with abdominal pain and multiple large hepatic cysts. EXAM: ULTRASOUND-GUIDED ASPIRATION OF HEPATIC CYSTS MEDICATIONS: Moderate sedation ANESTHESIA/SEDATION: Moderate (conscious) sedation was employed during this procedure. A total of Versed 2 mg and fentanyl 100 mcg was administered intravenously at the order of the provider performing the procedure. Total intra-service moderate sedation time: 24 minutes. Patient's level of consciousness and vital signs were monitored continuously by radiology nurse throughout the procedure under the supervision of the provider performing the procedure. COMPLICATIONS: None immediate. PROCEDURE: Informed written consent  was obtained from the patient after a thorough discussion of the procedural risks, benefits and alternatives. All questions were addressed. A timeout was performed prior to the initiation of the procedure. Large cysts were identified in the anterior abdomen and involving the left hepatic lobe. Anterior abdomen was prepped with chlorhexidine and sterile field was created. Skin was anesthetized with 1% lidocaine. Using ultrasound guidance, a Yueh catheter  was directed into the largest left hepatic cyst. 300 mL of clear yellow fluid was removed. Fluid was sent for culture and cytology. Ultrasound demonstrated a large residual cyst in this area suggesting there are probably 2 separate cysts. A new Yueh catheter was directed into the other cyst using ultrasound guidance and another 300 mL of clear yellow fluid was removed. Bandage placed over the puncture site. FINDINGS: Multiple large cysts in the liver. Largest cystic structure in the left hepatic lobe appears to represent 2 cysts. Two separate aspirations were performed and a total of 600 mL of fluid was removed. IMPRESSION: Successful ultrasound-guided aspiration of large left hepatic cysts. Total of 600 mL of fluid was removed. Electronically Signed   By: Juliene Balder M.D.   On: 02/14/2024 18:33   CT ABDOMEN PELVIS W CONTRAST Result Date: 02/12/2024 CLINICAL DATA:  Acute abdominal pain. Patient reports continued and worsening left upper abdominal pain. EXAM: CT ABDOMEN AND PELVIS WITH CONTRAST TECHNIQUE: Multidetector CT imaging of the abdomen and pelvis was performed using the standard protocol following bolus administration of intravenous contrast. RADIATION DOSE REDUCTION: This exam was performed according to the departmental dose-optimization program which includes automated exposure control, adjustment of the mA and/or kV according to patient size and/or use of iterative reconstruction technique. CONTRAST:  OMNIPAQUE  IOHEXOL  300 MG/ML  SOLN COMPARISON:  12/15/2023 FINDINGS: Lower chest: Subsegmental atelectasis in the right lower lobe. No pleural effusion. Small hiatal hernia. Hepatobiliary: Numerous cysts in the liver of varying sizes. Largest cyst is within the left lobe of the liver and spans greater than 11 cm transverse and is mildly exophytic. No evidence of solid liver lesion. Partially distended gallbladder, no biliary dilatation. Pancreas: Fatty atrophy. No ductal dilatation or inflammation.  No evidence of focal lesion. Spleen: Normal in size without focal abnormality. Adrenals/Urinary Tract: No adrenal nodule. No hydronephrosis or perinephric edema. Homogeneous renal enhancement with symmetric excretion on delayed phase imaging. Left renal cyst. No further follow-up imaging is recommended. Urinary bladder is completely decompressed. Stomach/Bowel: Small hiatal hernia. The stomach is nondistended. No bowel obstruction or inflammatory change. Sigmoid colonic diverticulosis without diverticulitis. Normal appendix. Vascular/Lymphatic: Aortic atherosclerosis. No aortic aneurysm. No acute vascular findings. No abdominopelvic adenopathy. Reproductive: Uterus and bilateral adnexa are unremarkable. Other: No free air, free fluid, or intra-abdominal fluid collection. Small fat containing umbilical hernia. Musculoskeletal: Levo scoliotic curvature of the spine. Multilevel facet hypertrophy in the spine. There are no acute or suspicious osseous abnormalities. IMPRESSION: 1. No acute abnormality or explanation for abdominal pain. 2. Numerous cysts in the liver of varying sizes. Largest cyst is within the left lobe of the liver and spans greater than 11 cm transverse and is mildly exophytic. Capsular stretching could potentially be a source of left-sided pain. Overall, no change from prior exam. 3. Sigmoid colonic diverticulosis without diverticulitis. 4. Small hiatal hernia. Aortic Atherosclerosis (ICD10-I70.0). Electronically Signed   By: Andrea Gasman M.D.   On: 02/12/2024 15:27    Physical Exam: BP 118/70   Pulse 82   Ht 5' 5 (1.651 m)   Wt 156 lb (70.8 kg)  BMI 25.96 kg/m  Constitutional: Pleasant,well-developed, Hispanic female in no acute distress.  Accompanied by daughter. HEENT: Normocephalic and atraumatic. Conjunctivae are normal. No scleral icterus. Neck supple.  Cardiovascular: Normal rate, regular rhythm.  Pulmonary/chest: Effort normal and breath sounds normal. No wheezing, rales or  rhonchi. Abdominal: Soft, nondistended, nontender. Bowel sounds active throughout. There are no masses palpable. No hepatomegaly. Extremities: no edema Neurological: Alert and oriented to person place and time. Skin: Skin is warm and dry. No rashes noted. Psychiatric: Normal mood and affect. Behavior is normal.  CBC    Component Value Date/Time   WBC 5.8 02/15/2024 0511   RBC 3.96 02/15/2024 0511   HGB 10.7 (L) 02/15/2024 0511   HCT 35.0 (L) 02/15/2024 0511   PLT 190 02/15/2024 0511   MCV 88.4 02/15/2024 0511   MCV 88.2 10/19/2012 1110   MCH 27.0 02/15/2024 0511   MCHC 30.6 02/15/2024 0511   RDW 13.6 02/15/2024 0511   LYMPHSABS 2.3 07/19/2016 1515   MONOABS 0.6 07/19/2016 1515   EOSABS 0.1 07/19/2016 1515   BASOSABS 0.0 07/19/2016 1515    CMP     Component Value Date/Time   NA 138 02/16/2024 1732   K 4.0 02/16/2024 1732   CL 101 02/16/2024 1732   CO2 20 (L) 02/16/2024 1732   GLUCOSE 101 (H) 02/16/2024 1732   BUN 19 02/16/2024 1732   CREATININE 0.76 02/16/2024 1732   CALCIUM 9.8 02/16/2024 1732   PROT 7.1 02/14/2024 0447   ALBUMIN 3.4 (L) 02/14/2024 0447   AST 26 02/14/2024 0447   ALT 18 02/14/2024 0447   ALKPHOS 87 02/14/2024 0447   BILITOT 0.4 02/14/2024 0447   GFRNONAA >60 02/16/2024 1732   GFRAA >60 09/10/2019 1428       Latest Ref Rng & Units 02/15/2024    5:11 AM 02/14/2024    4:47 AM 02/13/2024    4:51 AM  CBC EXTENDED  WBC 4.0 - 10.5 K/uL 5.8  7.2  8.0   RBC 3.87 - 5.11 MIL/uL 3.96  3.84    3.90  4.08   Hemoglobin 12.0 - 15.0 g/dL 89.2  89.1  88.9   HCT 36.0 - 46.0 % 35.0  33.7  36.0   Platelets 150 - 400 K/uL 190  171  201       ASSESSMENT AND PLAN:  83 year old female with nearly 3 months of persistent severe abdominal pain.  She was found to have a large hepatic cysts on CT, which were not significantly changed in size from an ultrasound 2 years ago.  Initially, it was not thought that these cysts were symptomatic, given the stable size.  An  EGD was notable for H. pylori gastritis   No ref. provider found

## 2024-02-22 NOTE — Patient Instructions (Signed)
 We have referred you to [facility]) for a consultation. They will contact directly with an appt.  _______________________________________________________  If your blood pressure at your visit was 140/90 or greater, please contact your primary care physician to follow up on this.  _______________________________________________________  If you are age 84 or older, your body mass index should be between 23-30. Your Body mass index is 25.96 kg/m. If this is out of the aforementioned range listed, please consider follow up with your Primary Care Provider.  If you are age 48 or younger, your body mass index should be between 19-25. Your Body mass index is 25.96 kg/m. If this is out of the aformentioned range listed, please consider follow up with your Primary Care Provider.   ________________________________________________________  The [facility] providers would like to encourage you to use MYCHART to communicate with providers for non-urgent requests or questions.  Due to long hold times on the telephone, sending your provider a message by [facility] may be a faster and more efficient way to get a response.  Please allow 48 business hours for a response.  Please remember that this is for non-urgent requests.  _______________________________________________________  [facility] is using a team-based approach to care.  Your team is made up of your doctor and two to three APPS. Our APPS (Nurse Practitioners and Physician Assistants) work with your physician to ensure care continuity for you. They are fully qualified to address your health concerns and develop a treatment plan. They communicate directly with your gastroenterologist to care for you. Seeing the Advanced Practice Practitioners on your physician's team can help you by facilitating care more promptly, often allowing for earlier appointments, access to diagnostic testing, procedures, and other specialty referrals.

## 2024-02-29 ENCOUNTER — Observation Stay (HOSPITAL_BASED_OUTPATIENT_CLINIC_OR_DEPARTMENT_OTHER)

## 2024-02-29 ENCOUNTER — Inpatient Hospital Stay (HOSPITAL_COMMUNITY)
Admission: EM | Admit: 2024-02-29 | Discharge: 2024-03-07 | DRG: 175 | Disposition: A | Discharge status: Home / Self Care | Attending: Emergency Medicine | Admitting: Emergency Medicine

## 2024-02-29 ENCOUNTER — Other Ambulatory Visit: Payer: Self-pay

## 2024-02-29 ENCOUNTER — Encounter (HOSPITAL_COMMUNITY): Payer: Self-pay | Admitting: Internal Medicine

## 2024-02-29 ENCOUNTER — Emergency Department (HOSPITAL_COMMUNITY)

## 2024-02-29 DIAGNOSIS — J189 Pneumonia, unspecified organism: Secondary | ICD-10-CM | POA: Insufficient documentation

## 2024-02-29 DIAGNOSIS — Z8249 Family history of ischemic heart disease and other diseases of the circulatory system: Secondary | ICD-10-CM

## 2024-02-29 DIAGNOSIS — Z83438 Family history of other disorder of lipoprotein metabolism and other lipidemia: Secondary | ICD-10-CM

## 2024-02-29 DIAGNOSIS — R195 Other fecal abnormalities: Secondary | ICD-10-CM

## 2024-02-29 DIAGNOSIS — D509 Iron deficiency anemia, unspecified: Secondary | ICD-10-CM | POA: Diagnosis present

## 2024-02-29 DIAGNOSIS — Z7901 Long term (current) use of anticoagulants: Secondary | ICD-10-CM

## 2024-02-29 DIAGNOSIS — J40 Bronchitis, not specified as acute or chronic: Secondary | ICD-10-CM | POA: Diagnosis not present

## 2024-02-29 DIAGNOSIS — I10 Essential (primary) hypertension: Secondary | ICD-10-CM | POA: Diagnosis present

## 2024-02-29 DIAGNOSIS — K7689 Other specified diseases of liver: Secondary | ICD-10-CM | POA: Diagnosis present

## 2024-02-29 DIAGNOSIS — I2699 Other pulmonary embolism without acute cor pulmonale: Secondary | ICD-10-CM

## 2024-02-29 DIAGNOSIS — Z23 Encounter for immunization: Secondary | ICD-10-CM

## 2024-02-29 DIAGNOSIS — I110 Hypertensive heart disease with heart failure: Secondary | ICD-10-CM | POA: Diagnosis present

## 2024-02-29 DIAGNOSIS — K219 Gastro-esophageal reflux disease without esophagitis: Secondary | ICD-10-CM | POA: Diagnosis present

## 2024-02-29 DIAGNOSIS — E782 Mixed hyperlipidemia: Secondary | ICD-10-CM | POA: Diagnosis present

## 2024-02-29 DIAGNOSIS — Q446 Cystic disease of liver: Secondary | ICD-10-CM

## 2024-02-29 DIAGNOSIS — I82451 Acute embolism and thrombosis of right peroneal vein: Secondary | ICD-10-CM | POA: Diagnosis present

## 2024-02-29 DIAGNOSIS — D229 Melanocytic nevi, unspecified: Secondary | ICD-10-CM | POA: Insufficient documentation

## 2024-02-29 DIAGNOSIS — Z888 Allergy status to other drugs, medicaments and biological substances status: Secondary | ICD-10-CM

## 2024-02-29 DIAGNOSIS — K921 Melena: Secondary | ICD-10-CM | POA: Insufficient documentation

## 2024-02-29 DIAGNOSIS — Z79899 Other long term (current) drug therapy: Secondary | ICD-10-CM

## 2024-02-29 DIAGNOSIS — I5032 Chronic diastolic (congestive) heart failure: Secondary | ICD-10-CM | POA: Diagnosis present

## 2024-02-29 DIAGNOSIS — Z66 Do not resuscitate: Secondary | ICD-10-CM | POA: Diagnosis present

## 2024-02-29 DIAGNOSIS — N179 Acute kidney failure, unspecified: Secondary | ICD-10-CM | POA: Diagnosis present

## 2024-02-29 LAB — I-STAT CHEM 8, ED
BUN: 14 mg/dL (ref 8–23)
Calcium, Ion: 1.18 mmol/L (ref 1.15–1.40)
Chloride: 99 mmol/L (ref 98–111)
Creatinine, Ser: 0.9 mg/dL (ref 0.44–1.00)
Glucose, Bld: 106 mg/dL — ABNORMAL HIGH (ref 70–99)
HCT: 36 % (ref 36.0–46.0)
Hemoglobin: 12.2 g/dL (ref 12.0–15.0)
Potassium: 3.6 mmol/L (ref 3.5–5.1)
Sodium: 138 mmol/L (ref 135–145)
TCO2: 30 mmol/L (ref 22–32)

## 2024-02-29 LAB — URINALYSIS, ROUTINE W REFLEX MICROSCOPIC
Bacteria, UA: NONE SEEN
Bilirubin Urine: NEGATIVE
Glucose, UA: NEGATIVE mg/dL
Hgb urine dipstick: NEGATIVE
Ketones, ur: NEGATIVE mg/dL
Leukocytes,Ua: NEGATIVE
Nitrite: NEGATIVE
Protein, ur: NEGATIVE mg/dL
Specific Gravity, Urine: 1.034 — ABNORMAL HIGH (ref 1.005–1.030)
pH: 6 (ref 5.0–8.0)

## 2024-02-29 LAB — COMPREHENSIVE METABOLIC PANEL WITH GFR
ALT: 20 U/L (ref 0–44)
AST: 27 U/L (ref 15–41)
Albumin: 3.8 g/dL (ref 3.5–5.0)
Alkaline Phosphatase: 85 U/L (ref 38–126)
Anion gap: 10 (ref 5–15)
BUN: 14 mg/dL (ref 8–23)
CO2: 29 mmol/L (ref 22–32)
Calcium: 9.7 mg/dL (ref 8.9–10.3)
Chloride: 100 mmol/L (ref 98–111)
Creatinine, Ser: 0.79 mg/dL (ref 0.44–1.00)
GFR, Estimated: >60 mL/min (ref >60)
Glucose, Bld: 105 mg/dL — ABNORMAL HIGH (ref 70–99)
Potassium: 3.6 mmol/L (ref 3.5–5.1)
Sodium: 138 mmol/L (ref 135–145)
Total Bilirubin: 0.8 mg/dL (ref 0.0–1.2)
Total Protein: 7.8 g/dL (ref 6.5–8.1)

## 2024-02-29 LAB — CBC WITH DIFFERENTIAL/PLATELET
Abs Immature Granulocytes: 0.04 K/uL (ref 0.00–0.07)
Basophils Absolute: 0 K/uL (ref 0.0–0.1)
Basophils Relative: 0 %
Eosinophils Absolute: 0 K/uL (ref 0.0–0.5)
Eosinophils Relative: 0 %
HCT: 36.4 % (ref 36.0–46.0)
Hemoglobin: 11.8 g/dL — ABNORMAL LOW (ref 12.0–15.0)
Immature Granulocytes: 0 %
Lymphocytes Relative: 12 %
Lymphs Abs: 1.5 K/uL (ref 0.7–4.0)
MCH: 28.2 pg (ref 26.0–34.0)
MCHC: 32.4 g/dL (ref 30.0–36.0)
MCV: 86.9 fL (ref 80.0–100.0)
Monocytes Absolute: 1.2 K/uL — ABNORMAL HIGH (ref 0.1–1.0)
Monocytes Relative: 10 %
Neutro Abs: 9.4 K/uL — ABNORMAL HIGH (ref 1.7–7.7)
Neutrophils Relative %: 78 %
Platelets: 228 K/uL (ref 150–400)
RBC: 4.19 MIL/uL (ref 3.87–5.11)
RDW: 13.9 % (ref 11.5–15.5)
WBC: 12.2 K/uL — ABNORMAL HIGH (ref 4.0–10.5)
nRBC: 0 % (ref 0.0–0.2)

## 2024-02-29 LAB — HEPARIN LEVEL (UNFRACTIONATED): Heparin Unfractionated: 0.33 [IU]/mL (ref 0.30–0.70)

## 2024-02-29 LAB — LIPASE, BLOOD: Lipase: 31 U/L (ref 11–51)

## 2024-02-29 MED ORDER — IOHEXOL 300 MG/ML  SOLN
100.0000 mL | Freq: Once | INTRAMUSCULAR | Status: AC | PRN
  Administered 2024-02-29: 100 mL via INTRAVENOUS

## 2024-02-29 MED ORDER — OXYCODONE HCL 5 MG PO TABS
5.0000 mg | ORAL_TABLET | Freq: Four times a day (QID) | ORAL | Status: DC | PRN
Start: 2024-02-29 — End: 2024-03-01
  Administered 2024-03-01: 5 mg via ORAL
  Filled 2024-02-29: qty 1

## 2024-02-29 MED ORDER — INFLUENZA VAC SPLIT HIGH-DOSE 0.5 ML IM SUSY
0.5000 mL | PREFILLED_SYRINGE | INTRAMUSCULAR | Status: AC
  Administered 2024-03-05: 0.5 mL via INTRAMUSCULAR
  Filled 2024-02-29: qty 0.5

## 2024-02-29 MED ORDER — LOSARTAN POTASSIUM 50 MG PO TABS
100.0000 mg | ORAL_TABLET | Freq: Every day | ORAL | Status: DC
  Administered 2024-02-29: 100 mg via ORAL
  Filled 2024-02-29 (×2): qty 2

## 2024-02-29 MED ORDER — HYDROCHLOROTHIAZIDE 25 MG PO TABS
25.0000 mg | ORAL_TABLET | Freq: Every day | ORAL | Status: DC
  Administered 2024-02-29: 25 mg via ORAL
  Filled 2024-02-29 (×2): qty 1

## 2024-02-29 MED ORDER — ACETAMINOPHEN 650 MG RE SUPP: 650.0000 mg | Freq: Four times a day (QID) | RECTAL | Status: DC | PRN

## 2024-02-29 MED ORDER — HEPARIN (PORCINE) 25000 UT/250ML-% IV SOLN
1250.0000 [IU]/h | INTRAVENOUS | Status: DC
  Administered 2024-02-29: 1100 [IU]/h via INTRAVENOUS
  Administered 2024-03-01: 1250 [IU]/h via INTRAVENOUS
  Filled 2024-02-29 (×2): qty 250

## 2024-02-29 MED ORDER — HYDROMORPHONE HCL 1 MG/ML IJ SOLN
0.5000 mg | INTRAMUSCULAR | Status: AC | PRN
  Administered 2024-02-29 (×2): 0.5 mg via INTRAVENOUS
  Filled 2024-02-29 (×2): qty 0.5

## 2024-02-29 MED ORDER — ONDANSETRON HCL 4 MG/2ML IJ SOLN
4.0000 mg | Freq: Once | INTRAMUSCULAR | Status: DC
  Filled 2024-02-29: qty 2

## 2024-02-29 MED ORDER — ACETAMINOPHEN 325 MG PO TABS
650.0000 mg | ORAL_TABLET | Freq: Four times a day (QID) | ORAL | Status: DC | PRN
  Administered 2024-03-02: 650 mg via ORAL
  Filled 2024-02-29: qty 2

## 2024-02-29 MED ORDER — HYDROMORPHONE HCL 1 MG/ML IJ SOLN
0.5000 mg | Freq: Once | INTRAMUSCULAR | Status: AC
  Administered 2024-02-29: 0.5 mg via INTRAVENOUS
  Filled 2024-02-29: qty 1

## 2024-02-29 MED ORDER — LOSARTAN POTASSIUM-HCTZ 100-25 MG PO TABS: 1.0000 | ORAL_TABLET | Freq: Every day | ORAL | Status: DC

## 2024-02-29 MED ORDER — ONDANSETRON HCL 4 MG/2ML IJ SOLN: 4.0000 mg | Freq: Four times a day (QID) | INTRAMUSCULAR | Status: DC | PRN

## 2024-02-29 MED ORDER — HYDROMORPHONE HCL 1 MG/ML IJ SOLN
0.5000 mg | Freq: Once | INTRAMUSCULAR | Status: AC
Start: 2024-02-29 — End: 2024-02-29
  Administered 2024-02-29: 0.5 mg via INTRAVENOUS
  Filled 2024-02-29: qty 1

## 2024-02-29 MED ORDER — ONDANSETRON HCL 4 MG PO TABS: 4.0000 mg | ORAL_TABLET | Freq: Four times a day (QID) | ORAL | Status: DC | PRN

## 2024-02-29 MED ORDER — HEPARIN BOLUS VIA INFUSION
3500.0000 [IU] | Freq: Once | INTRAVENOUS | Status: AC
  Administered 2024-02-29: 3500 [IU] via INTRAVENOUS
  Filled 2024-02-29: qty 3500

## 2024-02-29 NOTE — ED Notes (Signed)
 Placed back on 2L [place] after dilaudid administration sats dropped to 89%

## 2024-02-29 NOTE — ED Notes (Addendum)
 Pt SpO2 dropped to 91% after dilaudid. Placed on 2L [place]. Up to 98%

## 2024-02-29 NOTE — Progress Notes (Signed)
 PHARMACY - ANTICOAGULATION CONSULT NOTE  Pharmacy Consult for heparin Indication: pulmonary embolus  Allergies  Allergen Reactions   Hydralazine      Report numbness and tingling hands and feet. Anxiety.     Patient Measurements: Height: 5' 5 (165.1 cm) Weight: 70.8 kg (156 lb) IBW/kg (Calculated) : 57 HEPARIN DW (KG): 70.8  Vital Signs: Temp: 98.8 F (37.1 C) (11/12 0858) Temp Source: Oral (11/12 0858) BP: 154/95 (11/12 0858) Pulse Rate: 67 (11/12 1005)  Labs: Recent Labs    02/29/24 0956 02/29/24 1006  HGB 11.8* 12.2  HCT 36.4 36.0  PLT 228  --   CREATININE 0.79 0.90    Estimated Creatinine Clearance: 45.9 mL/min (by C-G formula based on SCr of 0.9 mg/dL).   Medical History: Past Medical History:  Diagnosis Date   H. pylori infection    Hypertension     Medications:  (Not in a hospital admission)  Scheduled:   ondansetron  (ZOFRAN ) IV  4 mg Intravenous Once    Assessment: 83 YO female admitted with RLQ/right flank pain that feels similar to previous hepatic cysts (cystic fluid analysis was notable for no malignant appearing cells, per 11/6 GI note). CT a/p showing known hepatic cysts from previous imaging as well as new bilateral pulmonary emboli. No anticoagulation noted PTA. Pharmacy has been consulted for heparin dosing.   Today, 02/29/24: Hgb 11.8, plts 228--stable, @baseline  Scr 0.90--stable No s/sx of bleeding documented   Goal of Therapy:  Heparin level 0.3-0.7 units/ml Monitor platelets by anticoagulation protocol: Yes   Plan:  Give heparin 3500 units IV x1 Start heparin infusion at 1100 units/hr Check heparin level 8hrs after heparin starts Monitor heparin level, CBC, and s/sx of bleeding daily F/u long-term [facility] plan   Lacinda Moats, PharmD Clinical Pharmacist  11/12/202512:17 PM

## 2024-02-29 NOTE — ED Provider Notes (Signed)
 [place] [facility]ERGENCY DEPARTMENT AT [facility] Provider Note   CSN: [id number] Arrival date & time: 02/29/24  9148     Patient presents with: Abdominal Pain   Karen [name] is a 83 y.o. female.  {Add pertinent medical, surgical, social history, OB history to HPI:32947}  Abdominal Pain    Patient has a history of hypertension H. pylori infection.  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  Patient was admitted to the hospital the end of October and was discharged on the 31st.  At that time patient was admitted for abdominal pain.  Patient had a CT scan that showed large hepatic cyst.  Patient had aspiration and sclerotherapy of that cyst.  Notes indicate findings were not consistent with malignancy.  She followed with GI and her symptoms had resolved patient presents to the ED with severe pain in her right side of her abdomen and flank.  Patient states the symptoms started yesterday.  The pain is sharp.  It increases with pain and movement.  She is not having any chest pain or shortness of breath.  She denies any vomiting or diarrhea.  Eating or drinking does not make it worse.  No fevers or chills  Prior to Admission medications   Medication Sig Start Date End Date Taking? Authorizing Provider  losartan -hydrochlorothiazide (HYZAAR) 100-25 MG tablet TAKE 1 TABLET BY MOUTH DAILY 02/10/22   Thedora Garnette HERO, MD  meclizine  (ANTIVERT ) 12.5 MG tablet Take 1 tablet (12.5 mg total) by mouth 3 (three) times daily as needed for dizziness. 09/18/20   Cirigliano, Ronal POUR, DO  nystatin (MYCOSTATIN) 100000 UNIT/ML suspension Take 5 mLs (500,000 Units total) by mouth 4 (four) times daily. 02/16/24   Regalado, Owen A, MD    Allergies: Hydralazine     Review of Systems  Gastrointestinal:  Positive for abdominal pain.    Updated Vital Signs BP (!) 154/95 (BP Location: Left Arm)   Pulse 67   Temp 98.8 F (37.1 C) (Oral)   Resp 20   Ht 1.651 m (5' 5)    Wt 70.8 kg   SpO2 100%   BMI 25.96 kg/m   Physical Exam Vitals and nursing note reviewed.  Constitutional:      Appearance: She is well-developed. She is not diaphoretic.     Comments: Appears to be in pain  HENT:     Head: Normocephalic and atraumatic.     Right Ear: External ear normal.     Left Ear: External ear normal.  Eyes:     General: No scleral icterus.       Right eye: No discharge.        Left eye: No discharge.     Conjunctiva/sclera: Conjunctivae normal.  Neck:     Trachea: No tracheal deviation.  Cardiovascular:     Rate and Rhythm: Normal rate and regular rhythm.  Pulmonary:     Effort: Pulmonary effort is normal. No respiratory distress.     Breath sounds: Normal breath sounds. No stridor. No wheezing or rales.  Abdominal:     General: Bowel sounds are normal. There is no distension.     Palpations: Abdomen is soft.     Tenderness: There is abdominal tenderness in the right upper quadrant and right lower quadrant. There is right CVA tenderness. There is no guarding or rebound.  Musculoskeletal:        General: No tenderness or deformity.     Cervical back: Neck supple.  Skin:    General: Skin is warm and dry.     Findings: No rash.  Neurological:     General: No focal deficit present.     Mental Status: She is alert.     Cranial Nerves: No cranial nerve deficit, dysarthria or facial asymmetry.     Sensory: No sensory deficit.     Motor: No abnormal muscle tone or seizure activity.     Coordination: Coordination normal.  Psychiatric:        Mood and Affect: Mood normal.     (all labs ordered are listed, but only abnormal results are displayed) Labs Reviewed  COMPREHENSIVE METABOLIC PANEL WITH GFR - Abnormal; Notable for the following components:      Result Value   Glucose, Bld 105 (*)    All other components within normal limits  CBC WITH DIFFERENTIAL/PLATELET - Abnormal; Notable for the following components:   WBC 12.2 (*)    Hemoglobin 11.8  (*)    Neutro Abs 9.4 (*)    Monocytes Absolute 1.2 (*)    All other components within normal limits  I-STAT CHEM 8, ED - Abnormal; Notable for the following components:   Glucose, Bld 106 (*)    All other components within normal limits  LIPASE, BLOOD  URINALYSIS, ROUTINE W REFLEX MICROSCOPIC    EKG: None  Radiology: CT ABDOMEN PELVIS W CONTRAST Result Date: 02/29/2024 CLINICAL DATA:  Right-sided abdominal pain beginning yesterday. Recent aspiration of liver cysts 02/14/2024. EXAM: CT ABDOMEN AND PELVIS WITH CONTRAST TECHNIQUE: Multidetector CT imaging of the abdomen and pelvis was performed using the standard protocol following bolus administration of intravenous contrast. RADIATION DOSE REDUCTION: This exam was performed according to the departmental dose-optimization program which includes automated exposure control, adjustment of the mA and/or kV according to patient size and/or use of iterative reconstruction technique. CONTRAST:  OMNIPAQUE  IOHEXOL  300 MG/ML  SOLN COMPARISON:  02/12/2024 FINDINGS: Lower chest: Borderline stable cardiomegaly calcified plaque over the descending thoracic aorta. There are bilateral proximal lower lobar pulmonary emboli noted. These are new compared to the recent prior exams. RV/LV ratio less than 1. Small sliding hiatal hernia unchanged. Visualized lung bases demonstrate a new small right pleural effusion with associated hazy right basilar opacification likely atelectasis and less likely infection. Hepatobiliary: Evidence of patient's known numerous liver cysts with interval decrease in size of the previously seen largest cyst in the left lobe now measuring 8.2 cm (previously 11.6 cm). Remaining liver cysts are not significantly changed. Gallbladder and biliary tree are unremarkable. Pancreas: Normal. Spleen: Normal. Adrenals/Urinary Tract: Adrenal glands are normal. Kidneys are normal size without hydronephrosis or nephrolithiasis. Stable upper pole 2.7 cm  left renal cyst. Ureters and bladder are normal. Stomach/Bowel: Small sliding hiatal hernia unchanged. Stomach is otherwise unremarkable. Small bowel is normal. Appendix is normal. Moderate diverticulosis of the sigmoid colon as the colon is otherwise unremarkable. Vascular/Lymphatic: Calcified plaque over the abdominal aorta which is normal caliber. Remaining vascular structures are unremarkable. No adenopathy. Reproductive: Uterus and bilateral adnexa are unremarkable. Other: No significant free peritoneal fluid or focal inflammatory change. Small umbilical hernia containing only peritoneal fat. Small right inguinal hernia containing only peritoneal fat. Musculoskeletal: No focal abnormality. Stable grade 1 anterolisthesis of L4 on L5. IMPRESSION: 1. Evidence of patient's known numerous liver cysts with interval decrease in size of the previously seen largest cyst in the left lobe post aspiration now measuring 8.2 cm (previously 11.6 cm). Remaining liver cysts are not significantly changed. 2. New  bilateral proximal lower lobar pulmonary emboli. 3. New small right pleural effusion with associated hazy right basilar opacification likely atelectasis and less likely infection. 4. Small sliding hiatal hernia unchanged. 5. Moderate diverticulosis of the sigmoid colon without active inflammation. 6. Small umbilical and right inguinal hernias containing only peritoneal fat. 7. Aortic atherosclerosis. Aortic Atherosclerosis (ICD10-I70.0). Critical Value/emergent results were called by telephone at the time of interpretation on 02/29/2024 at 11:48 am to provider [facility] , who verbally acknowledged these results. Electronically Signed   By: Toribio Agreste M.D.   On: 02/29/2024 11:52    {Document cardiac monitor, telemetry assessment procedure when appropriate:32947} .Critical Care  Performed by: Randol Simmonds, MD Authorized by: Randol Simmonds, MD   Critical care provider statement:    Critical care time (minutes):  30    Critical care was time spent personally by me on the following activities:  Development of treatment plan with patient or surrogate, discussions with consultants, evaluation of patient's response to treatment, examination of patient, ordering and review of laboratory studies, ordering and review of radiographic studies, ordering and performing treatments and interventions, pulse oximetry, re-evaluation of patient's condition and review of old charts    Medications Ordered in the ED  ondansetron  (ZOFRAN ) injection 4 mg (4 mg Intravenous Patient Refused/Not Given 02/29/24 0955)  heparin bolus via infusion 3,500 Units (has no administration in time range)  heparin ADULT infusion 100 units/mL (25000 units/250mL) (has no administration in time range)  HYDROmorphone (DILAUDID) injection 0.5 mg (0.5 mg Intravenous Given 02/29/24 0954)  iohexol  (OMNIPAQUE ) 300 MG/ML solution 100 mL (100 mLs Intravenous Contrast Given 02/29/24 1112)    Clinical Course as of 02/29/24 1220  Wed Feb 29, 2024  1032 I-stat chem 8, ED (not at [facility], [facility] or [facility])(!) I-STAT Chem-8 without significant abnormalities.  CBC shows leukocytosis [JK]  1151 Radiologist called to give report.  CT scan shows pulmonary embolism [JK]    Clinical Course User Index [JK] Randol Simmonds, MD   {Click here for ABCD2, HEART and other calculators REFRESH Note before signing:1}                              Medical Decision Making Differential diagnosis includes but not limited to hepatitis, pancreatitis recurrent pain associated with her hepatic cysts, hemorrhage associated with recent procedure  Problems Addressed: Acute pulmonary embolism without acute cor pulmonale, unspecified pulmonary embolism type [facility]): acute illness or injury that poses a threat to life or bodily functions  Amount and/or Complexity of Data Reviewed Labs: ordered. Decision-making details documented in ED Course. Radiology: ordered and independent interpretation  performed.  Risk Prescription drug management.   Patient presented to the ED with complaints of abdominal flank pain.  Patient recently had a liver cyst drained.  Initial labs did not show any signs of anemia.  No significant metabolic abnormalities.  No signs of hepatitis or pancreatitis.  CT scan was performed that does not show any acute abnormalities in the abdomen pelvis portion however there are bilateral lower lobe pulmonary emboli.  This appears to be the source of the patient's flank abdominal pain.  Will start on IV heparin.  I will consult the medical service for admission.  {Document critical care time when appropriate  Document review of labs and clinical decision tools ie CHADS2VASC2, etc  Document your independent review of radiology images and any outside records  Document your discussion with family members, caretakers and with consultants  Document social determinants of health affecting pt's care  Document your decision making why or why not admission, treatments were needed:32947:::1}   Final diagnoses:  Acute pulmonary embolism without acute cor pulmonale, unspecified pulmonary embolism type [facility]    ED Discharge Orders     None

## 2024-02-29 NOTE — Progress Notes (Signed)
 PHARMACY - ANTICOAGULATION CONSULT NOTE  Pharmacy Consult for heparin Indication: pulmonary embolus  Allergies  Allergen Reactions   Hydralazine      Report numbness and tingling hands and feet. Anxiety.     Patient Measurements: Height: 5' 5 (165.1 cm) Weight: 70.8 kg (156 lb) IBW/kg (Calculated) : 57 HEPARIN DW (KG): 70.8  Vital Signs: Temp: 98 F (36.7 C) (11/12 2100) Temp Source: Oral (11/12 2100) BP: 111/5 (11/12 2100) Pulse Rate: 80 (11/12 2100)  Labs: Recent Labs    02/29/24 0956 02/29/24 1006 02/29/24 2054  HGB 11.8* 12.2  --   HCT 36.4 36.0  --   PLT 228  --   --   HEPARINUNFRC  --   --  0.33  CREATININE 0.79 0.90  --     Estimated Creatinine Clearance: 45.9 mL/min (by C-G formula based on SCr of 0.9 mg/dL).   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 pylori infection    Hypertension    Iron deficiency anemia 08/05/2016   Pulmonary embolism (HCC) 02/29/2024    Medications:  Medications Prior to Admission  Medication Sig Dispense Refill Last Dose/Taking   losartan -hydrochlorothiazide (HYZAAR) 100-25 MG tablet TAKE 1 TABLET BY MOUTH DAILY 90 tablet 0 02/28/2024   meclizine  (ANTIVERT ) 12.5 MG tablet Take 1 tablet (12.5 mg total) by mouth 3 (three) times daily as needed for dizziness. 60 tablet 0 Unknown   nystatin (MYCOSTATIN) 100000 UNIT/ML suspension Take 5 mLs (500,000 Units total) by mouth 4 (four) times daily. (Patient not taking: Reported on 02/29/2024) 60 mL 0 Not Taking   Scheduled:   losartan   100 mg Oral Daily   And   hydrochlorothiazide  25 mg Oral Daily   [START ON 03/01/2024] Influenza vac split trivalent PF  0.5 mL Intramuscular Tomorrow-1000   ondansetron  (ZOFRAN ) IV  4 mg Intravenous Once    Assessment: 83 YO female admitted with RLQ/right flank pain that feels similar to previous hepatic cysts (cystic fluid analysis was  notable for no malignant appearing cells, per 11/6 GI note). CT a/p showing known hepatic cysts from previous imaging as well as new bilateral pulmonary emboli. No anticoagulation noted PTA. Pharmacy has been consulted for heparin dosing.   Today, 02/29/24: Hgb 11.8, plts 228--stable, @baseline  Scr 0.90--stable No s/sx of bleeding documented   1st HL 0.33, therapeutic on 1100 units/hr No bleeding reported  Goal of Therapy:  Heparin level 0.3-0.7 units/ml Monitor platelets by anticoagulation protocol: Yes   Plan:  continue heparin infusion at 1100 units/hr Check heparin level in 8 hours Monitor heparin level, CBC, and s/sx of bleeding daily F/u long-term AC plan   Leeroy Mace RPh 02/29/2024, 10:20 PM

## 2024-02-29 NOTE — H&P (Signed)
 History and Physical    Patient: Karen [name] FMW:[id number] DOB: 1940-09-22 DOA: 02/29/2024 DOS: the patient was seen and examined on 02/29/2024 PCP: Pcp, No  Patient coming from: Home  Chief Complaint:  Chief Complaint  Patient presents with   Abdominal Pain   HPI: Karen [name] is a 83 y.o. female with medical history significant of herpes zoster, H. pylori infection, hypertension, GERD, iron deficiency anemia, hyperlipidemia, grade 1 diastolic dysfunction, hypertensive heart failure, hepatic cystic disease who was admitted from 02/12/2024 until 02/17/2024 who is a status post drainage of a large liver cyst by IR who presented to the emergency department with right lower back/RUQ pain that she initially thought was her liver cysts, but then the pain radiated to her right shoulder and back.  She denied palpitations, lower extremity edema or dyspnea.  The patient was placed on oxygen after she received analgesics, but her O2 sat was initially normal on room air.  She denied fever, chills, rhinorrhea, sore throat, wheezing or hemoptysis.  No chest pain, palpitations, diaphoresis, PND, orthopnea or pitting edema of the lower extremities.  No abdominal pain, nausea, emesis, diarrhea, constipation, melena or hematochezia.  No flank pain, dysuria, frequency or hematuria.  No polyuria, polydipsia, polyphagia or blurred vision.   Lab work: Urinalysis had a specific gravity 1.034, but was otherwise unremarkable.  CBC showed a white count of 12.2, hemoglobin 11.8 g/dL and platelets 771.  CMP was normal with the assassin of a glucose of 105 mg/dL.  Lipase was 31 units/L.  Imaging: CT abdomen/pelvis with contrast show evidence of the patient's known numerous liver cysts with interval decrease in size of the previously seen largest cyst in the left lobe post aspiration.  There is new bilateral proximal lower lobe or pulmonary emboli.  New small right pleural effusion with associated hazy right basilar  opacification likely atelectasis and less likely infection.  Small sliding hiatal hernia.  Moderate diverticulosis of the sigmoid colon without diverticulitis.  Small umbilical and right inguinal hernias containing only peritoneal fat.  Aortic atherosclerosis.   ED course: Initial vital signs were temperature 98.8 F, pulse 73, respiration 20, BP 154/95 mmHg and O2 sat 95% on room air.  The patient received hydromorphone 0.5 mg IVP x 2 and was started on a heparin infusion.  Review of Systems: As mentioned in the history of present illness. All other systems reviewed and are negative.  Past Medical History:  Diagnosis Date   H. pylori infection    Hypertension    Past Surgical History:  Procedure Laterality Date   CATARACT EXTRACTION, BILATERAL     ESOPHAGOGASTRODUODENOSCOPY     EYE SURGERY     on her retina   liver cyst aspiration  01/2024   Social History:  reports that she has never smoked. She has never used smokeless tobacco. She reports that she does not drink alcohol and does not use drugs.  Allergies  Allergen Reactions   Hydralazine      Report numbness and tingling hands and feet. Anxiety.     Family History  Problem Relation Age of Onset   Hypertension Mother    Hyperlipidemia Mother    Hypertension Father    Colon cancer Neg Hx    Esophageal cancer Neg Hx    Rectal cancer Neg Hx    Stomach cancer Neg Hx     Prior to Admission medications   Medication Sig Start Date End Date Taking? Authorizing Provider  losartan -hydrochlorothiazide (HYZAAR) 100-25 MG tablet TAKE 1 TABLET BY  MOUTH DAILY 02/10/22  Yes Thedora Garnette HERO, MD  meclizine  (ANTIVERT ) 12.5 MG tablet Take 1 tablet (12.5 mg total) by mouth 3 (three) times daily as needed for dizziness. 09/18/20  Yes Cirigliano, Mary K, DO  nystatin (MYCOSTATIN) 100000 UNIT/ML suspension Take 5 mLs (500,000 Units total) by mouth 4 (four) times daily. Patient not taking: Reported on 02/29/2024 02/16/24   Madelyne Owen LABOR, MD     Physical Exam: Vitals:   02/29/24 0858 02/29/24 1005 02/29/24 1140  BP: (!) 154/95    Pulse: 73 67   Resp: 20    Temp: 98.8 F (37.1 C)    TempSrc: Oral    SpO2: 95% 100%   Weight:   70.8 kg  Height:   5' 5 (1.651 m)   Physical Exam Vitals reviewed.  Constitutional:      General: She is awake. She is not in acute distress.    Appearance: She is ill-appearing.     Interventions: Nasal cannula in place.  HENT:     Head: Normocephalic.     Nose: No rhinorrhea.     Mouth/Throat:     Mouth: Mucous membranes are moist.  Eyes:     General: No scleral icterus.    Pupils: Pupils are equal, round, and reactive to light.  Neck:     Vascular: No JVD.  Cardiovascular:     Rate and Rhythm: Normal rate and regular rhythm.     Heart sounds: S1 normal and S2 normal.  Pulmonary:     Breath sounds: No wheezing, rhonchi or rales.  Abdominal:     General: Bowel sounds are normal.     Palpations: Abdomen is soft.     Tenderness: There is abdominal tenderness in the right upper quadrant. There is right CVA tenderness. There is no left CVA tenderness, guarding or rebound.  Musculoskeletal:     Cervical back: Neck supple.     Right lower leg: No edema.     Left lower leg: No edema.  Skin:    General: Skin is warm and dry.  Neurological:     General: No focal deficit present.     Mental Status: She is alert and oriented to person, place, and time.  Psychiatric:        Mood and Affect: Mood normal.        Behavior: Behavior normal. Behavior is cooperative.     Data Reviewed:  Results are pending, will review when available.  Assessment and Plan: Principal Problem:   Pulmonary embolism (HCC) Associated with:   Acute deep vein thrombosis (DVT) of right peroneal vein (HCC) Observation/telemetry. Supplemental oxygen as needed. Continue heparin infusion. Analgesics as needed for pain. Antiemetics as needed. Trend troponin level. Obtain echocardiogram. Obtain lower  extremity Doppler.  Active Problems:   Iron deficiency anemia Monitor hematocrit hemoglobin. Transfuse as needed.    Gastroesophageal reflux disease without esophagitis Antiacid, H2 blocker or PPI as needed.    Essential hypertension   Chronic diastolic heart failure (HCC) Continue losartan  100 mg p.o. daily. Continue hydrochlorothiazide 12.5 mg p.o. daily.    Cystic disease of liver Continue to monitor clinically. Follow-up with GI as an outpatient.    Mixed hyperlipidemia Currently not on medical therapy. No statin in the setting of hepatic cystic disease.    Advance Care Planning:   Code Status: Limited: Do not attempt resuscitation (DNR) -DNR-LIMITED -Do Not Intubate/DNI    Consults:   Family Communication:   Severity of Illness: The appropriate patient status  for this patient is INPATIENT. Inpatient status is judged to be reasonable and necessary in order to provide the required intensity of service to ensure the patient's safety. The patient's presenting symptoms, physical exam findings, and initial radiographic and laboratory data in the context of their chronic comorbidities is felt to place them at high risk for further clinical deterioration. Furthermore, it is not anticipated that the patient will be medically stable for discharge from the hospital within 2 midnights of admission.   * I certify that at the point of admission it is my clinical judgment that the patient will require inpatient hospital care spanning beyond 2 midnights from the point of admission due to high intensity of service, high risk for further deterioration and high frequency of surveillance required.*  Author: Alm Dorn Castor, MD 02/29/2024 12:38 PM  For on call review [email or web address].   This document was prepared using Dragon voice recognition software and may contain some unintended transcription errors.

## 2024-02-29 NOTE — ED Triage Notes (Signed)
 Pt BIBA from home for RLQ/right flank pain starting around 4p yesterday. Feels similar to before she had a cyst on her liver drained. Denies NVD. 1g Tylenol  at 0230

## 2024-03-01 ENCOUNTER — Observation Stay (HOSPITAL_COMMUNITY)

## 2024-03-01 ENCOUNTER — Other Ambulatory Visit (HOSPITAL_COMMUNITY): Payer: Self-pay

## 2024-03-01 ENCOUNTER — Encounter (HOSPITAL_COMMUNITY): Payer: Self-pay

## 2024-03-01 ENCOUNTER — Telehealth (HOSPITAL_COMMUNITY): Payer: Self-pay | Admitting: Pharmacy Technician

## 2024-03-01 DIAGNOSIS — I2609 Other pulmonary embolism with acute cor pulmonale: Secondary | ICD-10-CM

## 2024-03-01 DIAGNOSIS — I2699 Other pulmonary embolism without acute cor pulmonale: Secondary | ICD-10-CM | POA: Diagnosis not present

## 2024-03-01 LAB — CBC
HCT: 34.7 % — ABNORMAL LOW (ref 36.0–46.0)
Hemoglobin: 11.1 g/dL — ABNORMAL LOW (ref 12.0–15.0)
MCH: 28 pg (ref 26.0–34.0)
MCHC: 32 g/dL (ref 30.0–36.0)
MCV: 87.6 fL (ref 80.0–100.0)
Platelets: 206 K/uL (ref 150–400)
RBC: 3.96 MIL/uL (ref 3.87–5.11)
RDW: 14.3 % (ref 11.5–15.5)
WBC: 11.4 K/uL — ABNORMAL HIGH (ref 4.0–10.5)
nRBC: 0 % (ref 0.0–0.2)

## 2024-03-01 LAB — COMPREHENSIVE METABOLIC PANEL WITH GFR
ALT: 16 U/L (ref 0–44)
AST: 32 U/L (ref 15–41)
Albumin: 3.4 g/dL — ABNORMAL LOW (ref 3.5–5.0)
Alkaline Phosphatase: 85 U/L (ref 38–126)
Anion gap: 12 (ref 5–15)
BUN: 21 mg/dL (ref 8–23)
CO2: 26 mmol/L (ref 22–32)
Calcium: 9.2 mg/dL (ref 8.9–10.3)
Chloride: 98 mmol/L (ref 98–111)
Creatinine, Ser: 1.02 mg/dL — ABNORMAL HIGH (ref 0.44–1.00)
GFR, Estimated: 54 mL/min — ABNORMAL LOW (ref >60)
Glucose, Bld: 101 mg/dL — ABNORMAL HIGH (ref 70–99)
Potassium: 3.5 mmol/L (ref 3.5–5.1)
Sodium: 136 mmol/L (ref 135–145)
Total Bilirubin: 0.6 mg/dL (ref 0.0–1.2)
Total Protein: 7.7 g/dL (ref 6.5–8.1)

## 2024-03-01 LAB — HEPARIN LEVEL (UNFRACTIONATED): Heparin Unfractionated: 0.28 [IU]/mL — ABNORMAL LOW (ref 0.30–0.70)

## 2024-03-01 LAB — ECHOCARDIOGRAM COMPLETE
Height: 65 in
S' Lateral: 2.5 cm
Weight: 2496 [oz_av]

## 2024-03-01 MED ORDER — LACTATED RINGERS IV SOLN: INTRAVENOUS | Status: DC

## 2024-03-01 MED ORDER — HYDROMORPHONE HCL 1 MG/ML IJ SOLN
0.5000 mg | INTRAMUSCULAR | Status: DC | PRN
  Administered 2024-03-01 – 2024-03-02 (×2): 0.5 mg via INTRAVENOUS
  Filled 2024-03-01 (×2): qty 0.5

## 2024-03-01 MED ORDER — ENSURE PLUS HIGH PROTEIN PO LIQD
237.0000 mL | Freq: Two times a day (BID) | ORAL | Status: DC
  Administered 2024-03-01 – 2024-03-07 (×14): 237 mL via ORAL

## 2024-03-01 MED ORDER — APIXABAN 5 MG PO TABS
10.0000 mg | ORAL_TABLET | Freq: Two times a day (BID) | ORAL | Status: DC
  Administered 2024-03-01 – 2024-03-07 (×12): 10 mg via ORAL
  Filled 2024-03-01 (×12): qty 2

## 2024-03-01 MED ORDER — PANTOPRAZOLE SODIUM 40 MG PO TBEC
40.0000 mg | DELAYED_RELEASE_TABLET | Freq: Every day | ORAL | Status: DC
  Administered 2024-03-01 – 2024-03-05 (×5): 40 mg via ORAL
  Filled 2024-03-01 (×5): qty 1

## 2024-03-01 MED ORDER — FERROUS SULFATE 325 (65 FE) MG PO TABS
325.0000 mg | ORAL_TABLET | Freq: Every day | ORAL | Status: DC
  Administered 2024-03-01 – 2024-03-04 (×4): 325 mg via ORAL
  Filled 2024-03-01 (×4): qty 1

## 2024-03-01 MED ORDER — LIDOCAINE 5 % EX PTCH
1.0000 | MEDICATED_PATCH | Freq: Every day | CUTANEOUS | Status: DC
  Administered 2024-03-01 – 2024-03-07 (×7): 1 via TRANSDERMAL
  Filled 2024-03-01 (×7): qty 1

## 2024-03-01 MED ORDER — SENNOSIDES-DOCUSATE SODIUM 8.6-50 MG PO TABS
1.0000 | ORAL_TABLET | Freq: Two times a day (BID) | ORAL | Status: DC
  Administered 2024-03-01 – 2024-03-05 (×9): 1 via ORAL
  Filled 2024-03-01 (×9): qty 1

## 2024-03-01 MED ORDER — TRAMADOL HCL 50 MG PO TABS
50.0000 mg | ORAL_TABLET | Freq: Four times a day (QID) | ORAL | Status: DC | PRN
  Administered 2024-03-01 – 2024-03-04 (×7): 50 mg via ORAL
  Filled 2024-03-01 (×7): qty 1

## 2024-03-01 MED ORDER — POLYETHYLENE GLYCOL 3350 17 G PO PACK
17.0000 g | PACK | Freq: Two times a day (BID) | ORAL | Status: DC
  Administered 2024-03-01 – 2024-03-05 (×8): 17 g via ORAL
  Filled 2024-03-01 (×9): qty 1

## 2024-03-01 MED ORDER — ACETAMINOPHEN 500 MG PO TABS
500.0000 mg | ORAL_TABLET | Freq: Three times a day (TID) | ORAL | Status: DC
  Administered 2024-03-01 – 2024-03-07 (×19): 500 mg via ORAL
  Filled 2024-03-01 (×19): qty 1

## 2024-03-01 MED ORDER — HYDROMORPHONE HCL 1 MG/ML IJ SOLN
0.5000 mg | INTRAMUSCULAR | Status: DC | PRN
  Administered 2024-03-01: 0.5 mg via INTRAVENOUS
  Filled 2024-03-01: qty 0.5

## 2024-03-01 MED ORDER — APIXABAN 5 MG PO TABS: 5.0000 mg | ORAL_TABLET | Freq: Two times a day (BID) | ORAL | Status: DC

## 2024-03-01 NOTE — Progress Notes (Signed)
 PHARMACY - ANTICOAGULATION CONSULT NOTE  Pharmacy Consult for heparin >> Eliquis Indication: pulmonary embolus and DVT  Allergies  Allergen Reactions   Hydralazine      Report numbness and tingling hands and feet. Anxiety.     Patient Measurements: Height: 5' 5 (165.1 cm) Weight: 70.8 kg (156 lb) IBW/kg (Calculated) : 57 HEPARIN DW (KG): 70.8  Vital Signs: Temp: 98.7 F (37.1 C) (11/13 0830) Temp Source: Oral (11/13 0830) BP: 112/54 (11/13 1000) Pulse Rate: 80 (11/13 1000)  Labs: Recent Labs    02/29/24 0956 02/29/24 1006 02/29/24 2054 03/01/24 0607 03/01/24 0809  HGB 11.8* 12.2  --  11.1*  --   HCT 36.4 36.0  --  34.7*  --   PLT 228  --   --  206  --   HEPARINUNFRC  --   --  0.33 0.28*  --   CREATININE 0.79 0.90  --   --  1.02*    Estimated Creatinine Clearance: 40.5 mL/min (A) (by C-G formula based on SCr of 1.02 mg/dL (H)).   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 pylori infection    Hypertension    Iron deficiency anemia 08/05/2016   Pulmonary embolism (HCC) 02/29/2024    Medications:  Medications Prior to Admission  Medication Sig Dispense Refill Last Dose/Taking   losartan -hydrochlorothiazide (HYZAAR) 100-25 MG tablet TAKE 1 TABLET BY MOUTH DAILY 90 tablet 0 02/28/2024   meclizine  (ANTIVERT ) 12.5 MG tablet Take 1 tablet (12.5 mg total) by mouth 3 (three) times daily as needed for dizziness. 60 tablet 0 Unknown   nystatin (MYCOSTATIN) 100000 UNIT/ML suspension Take 5 mLs (500,000 Units total) by mouth 4 (four) times daily. (Patient not taking: Reported on 02/29/2024) 60 mL 0 Not Taking   Scheduled:   acetaminophen   500 mg Oral TID   feeding supplement  237 mL Oral BID BM   losartan   100 mg Oral Daily   And   hydrochlorothiazide  25 mg Oral Daily   Influenza vac split trivalent PF  0.5 mL Intramuscular Tomorrow-1000   lidocaine  1 patch  Transdermal Daily   ondansetron  (ZOFRAN ) IV  4 mg Intravenous Once   pantoprazole   40 mg Oral Daily   polyethylene glycol  17 g Oral BID   senna-docusate  1 tablet Oral BID    Assessment: 83 YO female admitted with RLQ/right flank pain that feels similar to previous hepatic cysts (cystic fluid analysis was notable for no malignant appearing cells, per 11/6 GI note). CT a/p showing known hepatic cysts from previous imaging as well as new bilateral pulmonary emboli. Evidence of acute DVT in right peroneal vein shown on doppler.  No anticoagulation noted PTA. Pharmacy has been consulted for heparin dosing.   Today, 03/01/24: Heparin level 0.28 - slightly subtherapeutic on infusion running at 1100 units/hr  Hgb down to 11.1; PLTc -- stable Scr 0.90--stable No complications with infusion or s/sx of bleeding noted per RN Pharmacy now consulted to transition patient to Eliquis   Goal of Therapy:  Heparin level 0.3-0.7 units/ml Monitor platelets by anticoagulation protocol: Yes   Plan:  Stop heparin now Start Eliquis 10mg  PO BID x7 days, followed by 5mg  PO BID thereafter Continue to monitor for s/sx of bleeding    Thank you for allowing pharmacy to be a part of this patient's care.  Lacinda Moats, PharmD Clinical Pharmacist  11/13/20253:17 PM

## 2024-03-01 NOTE — Care Management Obs Status (Signed)
 MEDICARE OBSERVATION STATUS NOTIFICATION   Patient Details  Name: Karen [name] MRN: [id number] Date of Birth: 1940-06-27   Medicare Observation Status Notification Given:  Yes    Aisia Correira A Carol Loftin, LCSW 03/01/2024, 1:12 PM

## 2024-03-01 NOTE — TOC Progression Note (Signed)
 Transition of Care [facility]) - Progression Note    Patient Details  Name: Karen [name] MRN: [id number] Date of Birth: [birth year]  Transition of Care [place]) CM/SW Contact  Heather DELENA Saltness, LCSW Phone Number: 03/01/2024, 10:36 AM  Clinical Narrative:    CSW attempted to give pt MOON at bedside, but pt was receiving medication. CSW will follow up at a later time.    Expected Discharge Plan and Services  Home   Social Drivers of Health (SDOH) Interventions SDOH Screenings   Food Insecurity: No Food Insecurity (02/29/2024)  Housing: Low Risk  (02/29/2024)  Transportation Needs: No Transportation Needs (02/29/2024)  Utilities: Not At Risk (02/29/2024)  Alcohol Screen: Low Risk  (11/03/2021)  Depression (PHQ2-9): Low Risk  (11/03/2021)  Financial Resource Strain: Low Risk  (11/03/2021)  Physical Activity: Sufficiently Active (11/03/2021)  Social Connections: Moderately Integrated (02/29/2024)  Stress: No Stress Concern Present (11/03/2021)  Tobacco Use: Low Risk  (02/22/2024)    Readmission Risk Interventions     No data to display          Signed: Heather Saltness, MSW, LCSW Clinical Social Worker Inpatient Care Management 03/01/2024 10:38 AM

## 2024-03-01 NOTE — Progress Notes (Addendum)
 PHARMACY - ANTICOAGULATION CONSULT NOTE  Pharmacy Consult for heparin Indication: pulmonary embolus  Allergies  Allergen Reactions   Hydralazine      Report numbness and tingling hands and feet. Anxiety.     Patient Measurements: Height: 5' 5 (165.1 cm) Weight: 70.8 kg (156 lb) IBW/kg (Calculated) : 57 HEPARIN DW (KG): 70.8  Vital Signs: Temp: 98.2 F (36.8 C) (11/13 0445) Temp Source: Oral (11/13 0445) BP: 114/56 (11/13 0445) Pulse Rate: 74 (11/13 0445)  Labs: Recent Labs    02/29/24 0956 02/29/24 1006 02/29/24 2054 03/01/24 0607  HGB 11.8* 12.2  --  11.1*  HCT 36.4 36.0  --  34.7*  PLT 228  --   --  206  HEPARINUNFRC  --   --  0.33 0.28*  CREATININE 0.79 0.90  --   --     Estimated Creatinine Clearance: 45.9 mL/min (by C-G formula based on SCr of 0.9 mg/dL).   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 pylori infection    Hypertension    Iron deficiency anemia 08/05/2016   Pulmonary embolism (HCC) 02/29/2024    Medications:  Medications Prior to Admission  Medication Sig Dispense Refill Last Dose/Taking   losartan -hydrochlorothiazide (HYZAAR) 100-25 MG tablet TAKE 1 TABLET BY MOUTH DAILY 90 tablet 0 02/28/2024   meclizine  (ANTIVERT ) 12.5 MG tablet Take 1 tablet (12.5 mg total) by mouth 3 (three) times daily as needed for dizziness. 60 tablet 0 Unknown   nystatin (MYCOSTATIN) 100000 UNIT/ML suspension Take 5 mLs (500,000 Units total) by mouth 4 (four) times daily. (Patient not taking: Reported on 02/29/2024) 60 mL 0 Not Taking   Scheduled:   losartan   100 mg Oral Daily   And   hydrochlorothiazide  25 mg Oral Daily   Influenza vac split trivalent PF  0.5 mL Intramuscular Tomorrow-1000   ondansetron  (ZOFRAN ) IV  4 mg Intravenous Once    Assessment: 83 YO female admitted with RLQ/right flank pain that feels similar to previous hepatic cysts (cystic  fluid analysis was notable for no malignant appearing cells, per 11/6 GI note). CT a/p showing known hepatic cysts from previous imaging as well as new bilateral pulmonary emboli. Evidence of acute DVT in right peroneal vein shown on doppler.  No anticoagulation noted PTA. Pharmacy has been consulted for heparin dosing.   Today, 03/01/24: Heparin level 0.28 - slightly subtherapeutic on infusion running at 1100 units/hr  Hgb down to 11.1; PLTc -- stable Scr 0.90--stable No complications with infusion or s/sx of bleeding noted per RN  Goal of Therapy:  Heparin level 0.3-0.7 units/ml Monitor platelets by anticoagulation protocol: Yes   Plan:  Increase heparin infusion to 1250 units/hr Check heparin level in 8 hours after rate increase  Monitor heparin level, CBC and s/sx of bleeding daily F/u long-term anticoagulation plan   Thank you for allowing pharmacy to be a part of this patient's care.  Marget Hench, PharmD Clinical Pharmacist 03/01/2024 7:11 AM

## 2024-03-01 NOTE — Progress Notes (Signed)
 PROGRESS NOTE    Karen [name]  FMW:[id number] DOB: 25-May-1940 DOA: 02/29/2024 PCP: Pcp, No   Brief Narrative: 83 year old with past medical history significant for herpes zoster, H. pylori, hypertension, GERD, iron deficiency anemia, hyperlipidemia, grade 1 diastolic dysfunction, hypertensive heart disease, hepatic cystic disease who was admitted 02/12/2024 until 10/31st/2025 and status post drainage of large liver cyst by IR presents to the ED complaining of right lower back and right upper quadrant abdominal pain initially thought similar to her previous liver cyst.  Subsequent evaluation in the ED CT abdomen and pelvis with contrast show evidence of patient's known numerous liver cysts with interval decrease in size of the previously seen largest cyst in the left lobe post aspiration.  New bilateral proximal lower lobe pulmonary emboli.  New small right pleural effusion with associated hazy right basilar opacity likely atelectasis or less likely infection.   Assessment & Plan:   Principal Problem:   Pulmonary embolism (HCC) Active Problems:   Iron deficiency anemia   Gastroesophageal reflux disease without esophagitis   Essential hypertension   Chronic diastolic heart failure (HCC)   Cystic disease of liver   Acute deep vein thrombosis (DVT) of right peroneal vein (HCC)   Mixed hyperlipidemia  1-Acute BL Pulmonary embolism  Acute deep vein thrombosis right peroneal vein - Follow echo: Normal EF, normal RV function.  - Doppler: Right consistent with acute deep vein thrombosis involving the right peroneal veins. -Currently on heparin gtt, now ECHO with normal RV function will transition to Eliquis.  -Patient still having significant right side chest pain, flank pain. Requiring IV dilaudid. Discussed with daughter will tray tramadol, to see if it would make her less sedated.  -Will check chest x ray to check for PNA, or other cause of pain.  -PE was diagnosed by CT abdomen pelvis.  She will need to be on anticoagulation for DVT as well, with mild increased cr would like to avoid further contrast.  -Start Lydocaine patch, schedule tyelnel. Incentive spirometry.  Chest pain is pleuritic making PE most likely diagnosis.  Would avoid IV toradol due to increase risk for bleeding, and risk for gastritis.    Iron deficiency anemia Iron and Tsat low.  Continue with iron supplement.   GERD; resume PPI  Hypertension: Continue with Cozaar  and hydrochlorothiazide, will hold today SBP 112.   Cystic disease of liver: She was referred to hepatologist  Mixed hyperlipidemia  AKI; mild increased cr to 1.0. baseline 0.7 Start low rate IV fluids.   Estimated body mass index is 25.96 kg/m as calculated from the following:   Height as of this encounter: 5' 5 (1.651 m).   Weight as of this encounter: 70.8 kg.   DVT prophylaxis: heparin  Code Status: DNR Family Communication: Daughter at bedside Disposition Plan:  Status is: Observation The patient will require care spanning > 2 midnights and should be moved to inpatient because: management of chest pain, PE<>     Consultants:  none  Procedures:  ECHO Doppler  Antimicrobials:    Subjective: She is having a lot chest pain 7/10, not worse but not better.  Chest pain is pleuritic, with deep breathing. Mostly right side of chest.   Objective: Vitals:   02/29/24 1509 02/29/24 2100 02/29/24 2235 03/01/24 0445  BP: 138/70 (!) 111/5 (!) 116/59 (!) 114/56  Pulse: 84 80 80 74  Resp:  18 17 17   Temp: 100 F (37.8 C) 98 F (36.7 C) 98.1 F (36.7 C) 98.2 F (36.8 C)  TempSrc: Oral Oral Oral Oral  SpO2: 100% 100% 98% 100%  Weight:      Height:       No intake or output data in the 24 hours ending 03/01/24 0748 Filed Weights   02/29/24 1140  Weight: 70.8 kg    Examination:  General exam: Appears calm and comfortable  Respiratory system: Clear to auscultation. Respiratory effort normal. Cardiovascular  system: S1 & S2 heard, RRR.  Gastrointestinal system: Abdomen is nondistended, soft and nontender. No organomegaly or masses felt. Normal bowel sounds heard. Central nervous system: Alert and oriented. No focal neurological deficits. Extremities: Symmetric 5 x 5 power.   Data Reviewed: I have personally reviewed following labs and imaging studies  CBC: Recent Labs  Lab 02/29/24 0956 02/29/24 1006 03/01/24 0607  WBC 12.2*  --  11.4*  NEUTROABS 9.4*  --   --   HGB 11.8* 12.2 11.1*  HCT 36.4 36.0 34.7*  MCV 86.9  --  87.6  PLT 228  --  206   Basic Metabolic Panel: Recent Labs  Lab 02/29/24 0956 02/29/24 1006  NA 138 138  K 3.6 3.6  CL 100 99  CO2 29  --   GLUCOSE 105* 106*  BUN 14 14  CREATININE 0.79 0.90  CALCIUM 9.7  --    GFR: Estimated Creatinine Clearance: 45.9 mL/min (by C-G formula based on SCr of 0.9 mg/dL). Liver Function Tests: Recent Labs  Lab 02/29/24 0956  AST 27  ALT 20  ALKPHOS 85  BILITOT 0.8  PROT 7.8  ALBUMIN 3.8   Recent Labs  Lab 02/29/24 0956  LIPASE 31   No results for input(s): AMMONIA in the last 168 hours. Coagulation Profile: No results for input(s): INR, PROTIME in the last 168 hours. Cardiac Enzymes: No results for input(s): CKTOTAL, CKMB, CKMBINDEX, TROPONINI in the last 168 hours. BNP (last 3 results) No results for input(s): PROBNP in the last 8760 hours. HbA1C: No results for input(s): HGBA1C in the last 72 hours. CBG: No results for input(s): GLUCAP in the last 168 hours. Lipid Profile: No results for input(s): CHOL, HDL, LDLCALC, TRIG, CHOLHDL, LDLDIRECT in the last 72 hours. Thyroid Function Tests: No results for input(s): TSH, T4TOTAL, FREET4, T3FREE, THYROIDAB in the last 72 hours. Anemia Panel: No results for input(s): VITAMINB12, FOLATE, FERRITIN, TIBC, IRON, RETICCTPCT in the last 72 hours. Sepsis Labs: No results for input(s): PROCALCITON, LATICACIDVEN  in the last 168 hours.  No results found for this or any previous visit (from the past 240 hours).       Radiology Studies: VAS US  LOWER EXTREMITY VENOUS (DVT) Result Date: 02/29/2024  Lower Venous DVT Study Patient Name:  Karen [name]  Date of Exam:   02/29/2024 Medical Rec #: [id number]       Accession #:    [id number] Date of Birth: 06-17-1940       Patient Gender: F Patient Age:   17 years Exam Location:  [facility] Procedure:      VAS US  LOWER EXTREMITY VENOUS (DVT) Referring Phys: DAVID ORTIZ --------------------------------------------------------------------------------  Indications: Pulmonary embolism, and Right sided pain.  Comparison Study: No prior exam. Performing Technologist: Edilia Elden Appl  Examination Guidelines: A complete evaluation includes B-mode imaging, spectral Doppler, color Doppler, and power Doppler as needed of all accessible portions of each vessel. Bilateral testing is considered an integral part of a complete examination. Limited examinations for reoccurring indications may be performed as noted. The reflux portion of the exam is performed with  the patient in reverse Trendelenburg.  +---------+---------------+---------+-----------+----------+--------------+ RIGHT    CompressibilityPhasicitySpontaneityPropertiesThrombus Aging +---------+---------------+---------+-----------+----------+--------------+ CFV      Full           Yes      Yes                                 +---------+---------------+---------+-----------+----------+--------------+ SFJ      Full           Yes      Yes                                 +---------+---------------+---------+-----------+----------+--------------+ FV Prox  Full                                                        +---------+---------------+---------+-----------+----------+--------------+ FV Mid   Full                                                         +---------+---------------+---------+-----------+----------+--------------+ FV DistalFull                                                        +---------+---------------+---------+-----------+----------+--------------+ PFV      Full                                                        +---------+---------------+---------+-----------+----------+--------------+ POP      Full           Yes      Yes                                 +---------+---------------+---------+-----------+----------+--------------+ PTV      Full                                                        +---------+---------------+---------+-----------+----------+--------------+ PERO     None           No       No                                  +---------+---------------+---------+-----------+----------+--------------+ Deep vein thrombosis noted in one of the paired peroneal veins of the right lower extremity.  +---------+---------------+---------+-----------+----------+--------------+ LEFT     CompressibilityPhasicitySpontaneityPropertiesThrombus Aging +---------+---------------+---------+-----------+----------+--------------+ CFV      Full           Yes      Yes                                 +---------+---------------+---------+-----------+----------+--------------+  SFJ      Full           Yes      Yes                                 +---------+---------------+---------+-----------+----------+--------------+ FV Prox  Full                                                        +---------+---------------+---------+-----------+----------+--------------+ FV Mid   Full                                                        +---------+---------------+---------+-----------+----------+--------------+ FV DistalFull                                                        +---------+---------------+---------+-----------+----------+--------------+ PFV      Full                                                         +---------+---------------+---------+-----------+----------+--------------+ POP      Full           Yes      Yes                                 +---------+---------------+---------+-----------+----------+--------------+ PTV      Full                                                        +---------+---------------+---------+-----------+----------+--------------+ PERO     Full                                                        +---------+---------------+---------+-----------+----------+--------------+     Summary: RIGHT: - Findings consistent with acute deep vein thrombosis involving the right peroneal veins.  - No cystic structure found in the popliteal fossa.  LEFT: - No evidence of deep vein thrombosis in the lower extremity. No indirect evidence of obstruction proximal to the inguinal ligament.  - No cystic structure found in the popliteal fossa.  *See table(s) above for measurements and observations. Electronically signed by Debby Robertson on 02/29/2024 at 4:47:31 PM.    Final    CT ABDOMEN PELVIS W CONTRAST Result Date: 02/29/2024 CLINICAL DATA:  Right-sided abdominal pain beginning yesterday. Recent aspiration of liver cysts 02/14/2024. EXAM: CT ABDOMEN AND PELVIS WITH CONTRAST TECHNIQUE: Multidetector  CT imaging of the abdomen and pelvis was performed using the standard protocol following bolus administration of intravenous contrast. RADIATION DOSE REDUCTION: This exam was performed according to the departmental dose-optimization program which includes automated exposure control, adjustment of the mA and/or kV according to patient size and/or use of iterative reconstruction technique. CONTRAST:  OMNIPAQUE  IOHEXOL  300 MG/ML  SOLN COMPARISON:  02/12/2024 FINDINGS: Lower chest: Borderline stable cardiomegaly calcified plaque over the descending thoracic aorta. There are bilateral proximal lower lobar pulmonary emboli noted. These are  new compared to the recent prior exams. RV/LV ratio less than 1. Small sliding hiatal hernia unchanged. Visualized lung bases demonstrate a new small right pleural effusion with associated hazy right basilar opacification likely atelectasis and less likely infection. Hepatobiliary: Evidence of patient's known numerous liver cysts with interval decrease in size of the previously seen largest cyst in the left lobe now measuring 8.2 cm (previously 11.6 cm). Remaining liver cysts are not significantly changed. Gallbladder and biliary tree are unremarkable. Pancreas: Normal. Spleen: Normal. Adrenals/Urinary Tract: Adrenal glands are normal. Kidneys are normal size without hydronephrosis or nephrolithiasis. Stable upper pole 2.7 cm left renal cyst. Ureters and bladder are normal. Stomach/Bowel: Small sliding hiatal hernia unchanged. Stomach is otherwise unremarkable. Small bowel is normal. Appendix is normal. Moderate diverticulosis of the sigmoid colon as the colon is otherwise unremarkable. Vascular/Lymphatic: Calcified plaque over the abdominal aorta which is normal caliber. Remaining vascular structures are unremarkable. No adenopathy. Reproductive: Uterus and bilateral adnexa are unremarkable. Other: No significant free peritoneal fluid or focal inflammatory change. Small umbilical hernia containing only peritoneal fat. Small right inguinal hernia containing only peritoneal fat. Musculoskeletal: No focal abnormality. Stable grade 1 anterolisthesis of L4 on L5. IMPRESSION: 1. Evidence of patient's known numerous liver cysts with interval decrease in size of the previously seen largest cyst in the left lobe post aspiration now measuring 8.2 cm (previously 11.6 cm). Remaining liver cysts are not significantly changed. 2. New bilateral proximal lower lobar pulmonary emboli. 3. New small right pleural effusion with associated hazy right basilar opacification likely atelectasis and less likely infection. 4. Small sliding  hiatal hernia unchanged. 5. Moderate diverticulosis of the sigmoid colon without active inflammation. 6. Small umbilical and right inguinal hernias containing only peritoneal fat. 7. Aortic atherosclerosis. Aortic Atherosclerosis (ICD10-I70.0). Critical Value/emergent results were called by telephone at the time of interpretation on 02/29/2024 at 11:48 am to provider [facility] , who verbally acknowledged these results. Electronically Signed   By: Toribio Agreste M.D.   On: 02/29/2024 11:52        Scheduled Meds:  losartan   100 mg Oral Daily   And   hydrochlorothiazide  25 mg Oral Daily   Influenza vac split trivalent PF  0.5 mL Intramuscular Tomorrow-1000   ondansetron  (ZOFRAN ) IV  4 mg Intravenous Once   Continuous Infusions:  heparin 1,250 Units/hr (03/01/24 0728)     LOS: 0 days    Time spent: 35 Minutes    Aprill Banko A Donal Lynam, MD [facility]   If 7PM-7AM, please contact night-coverage [email or web address]  03/01/2024, 7:48 AM

## 2024-03-01 NOTE — Plan of Care (Signed)
  Problem: Education: Goal: Knowledge of General Education information will improve Description: Including pain rating scale, medication(s)/side effects and non-pharmacologic comfort measures Outcome: Progressing   Problem: Health Behavior/Discharge Planning: Goal: Ability to manage health-related needs will improve Outcome: Progressing   Problem: Clinical Measurements: Goal: Will remain free from infection Outcome: Progressing Goal: Diagnostic test results will improve Outcome: Progressing Goal: Respiratory complications will improve Outcome: Progressing Goal: Cardiovascular complication will be avoided Outcome: Progressing   Problem: Nutrition: Goal: Adequate nutrition will be maintained Outcome: Progressing   Problem: Coping: Goal: Level of anxiety will decrease Outcome: Progressing   Problem: Elimination: Goal: Will not experience complications related to bowel motility Outcome: Progressing Goal: Will not experience complications related to urinary retention Outcome: Progressing   Problem: Pain Managment: Goal: General experience of comfort will improve and/or be controlled Outcome: Progressing   Problem: Safety: Goal: Ability to remain free from injury will improve Outcome: Progressing

## 2024-03-01 NOTE — Progress Notes (Signed)
  Echocardiogram 2D Echocardiogram has been performed.  Tinnie FORBES Gosling RDCS 03/01/2024, 11:43 AM

## 2024-03-01 NOTE — Discharge Instructions (Addendum)
 Information on my medicine - ELIQUIS (apixaban)  Why was Eliquis prescribed for you? Eliquis was prescribed to treat blood clots that may have been found in the veins of your legs (deep vein thrombosis) or in your lungs (pulmonary embolism) and to reduce the risk of them occurring again.  What do You need to know about Eliquis ? The starting dose is 10 mg (two 5 mg tablets) taken TWICE daily for the FIRST SEVEN (7) DAYS, then on 11/20  the dose is reduced to ONE 5 mg tablet taken TWICE daily.  Eliquis may be taken with or without food.   Try to take the dose about the same time in the morning and in the evening. If you have difficulty swallowing the tablet whole please discuss with your pharmacist how to take the medication safely.  Take Eliquis exactly as prescribed and DO NOT stop taking Eliquis without talking to the doctor who prescribed the medication.  Stopping may increase your risk of developing a new blood clot.  Refill your prescription before you run out.  After discharge, you should have regular check-up appointments with your healthcare provider that is prescribing your Eliquis.    What do you do if you miss a dose? If a dose of ELIQUIS is not taken at the scheduled time, take it as soon as possible on the same day and twice-daily administration should be resumed. The dose should not be doubled to make up for a missed dose.  Important Safety Information A possible side effect of Eliquis is bleeding. You should call your healthcare provider right away if you experience any of the following: Bleeding from an injury or your nose that does not stop. Unusual colored urine (red or dark brown) or unusual colored stools (red or black). Unusual bruising for unknown reasons. A serious fall or if you hit your head (even if there is no bleeding).  Some medicines may interact with Eliquis and might increase your risk of bleeding or clotting while on Eliquis. To help avoid this,  consult your healthcare provider or pharmacist prior to using any new prescription or non-prescription medications, including herbals, vitamins, non-steroidal anti-inflammatory drugs (NSAIDs) and supplements.  This website has more information on Eliquis (apixaban): http://[email or web address].[email or web address]/home    [facility] Primary Care  [facility] [address] [place], [place] [address]  1.2 miles from [place], [place] [address] [phone] Get Driving Directions  [facility] [address] [place], [place] [address]  1.8 miles from [place], [place] [address] [phone] Get Driving Directions  [facility] [address] [place], [place] [address]  1.8 miles from [place], [place] [address] [phone] Get Driving Directions  [facility] [address] [place], [place] [address]  1.8 miles from [place], [place] [address] [phone] Get Driving Directions  [facility] [address] [place], [place] [address]  4.[address] miles from [address], [place] [address] [phone] Get Driving Directions  [facility] at [facility] [address] [place], [place] [address]  6.8 miles from [place], [place] [address] [phone] Get Driving Directions   Take Eliquis 10 mg Twice a day for 3 days then take 5 mg Twice a day there after.  You have a new antibiotics for Treatment of PNA>  You have a new inhaler for Wheezing.

## 2024-03-01 NOTE — Plan of Care (Signed)

## 2024-03-01 NOTE — Progress Notes (Signed)
   03/01/24 0945  TOC Brief Assessment  Insurance and Status Reviewed  Patient has primary care physician No (No PCP listed, but pt has active [facility] Medicare)  Home environment has been reviewed Apartment  Prior level of function: Independent  Prior/Current Home Services No current home services  Social Drivers of Health Review SDOH reviewed no interventions necessary  Readmission risk has been reviewed Yes  Transition of care needs transition of care needs identified, TOC will continue to follow    Pt has no PCP listed in chart, however, pt has active [facility] Medicare coverage. TOC will continue to follow for needs.  Signed: Heather Saltness, MSW, LCSW Clinical Social Worker Inpatient Care Management 03/01/2024 9:46 AM

## 2024-03-01 NOTE — Progress Notes (Signed)
 I attest to student documentation.  Brek Reece V. Tashaun Obey, MSN-RN Nursing Faculty/Clinical Instructor [facility] Associate Degree Nursing Program

## 2024-03-01 NOTE — Telephone Encounter (Signed)
 Patient Product/process development scientist completed.    The patient is insured through [facility]. Patient has Medicare and is not eligible for a copay card, but may be able to apply for patient assistance or Medicare RX Payment Plan (Patient Must reach out to their plan, if eligible for payment plan), if available.    Ran test claim for Eliquis 5 mg and the current 30 day co-pay is $0.00.  Ran test claim for Xarelto  20 mg and the current 30 day co-pay is $0.00.  This test claim was processed through [facility]- copay amounts may vary at other pharmacies due to pharmacy/plan contracts, or as the patient moves through the different stages of their insurance plan.     Reyes Sharps, CPHT Pharmacy Technician Patient Advocate Specialist Lead [facility] Patient Advocate Team Direct Number: [phone]  Fax: [phone]

## 2024-03-02 ENCOUNTER — Other Ambulatory Visit (HOSPITAL_COMMUNITY): Payer: Self-pay

## 2024-03-02 DIAGNOSIS — I2699 Other pulmonary embolism without acute cor pulmonale: Secondary | ICD-10-CM | POA: Diagnosis not present

## 2024-03-02 LAB — BASIC METABOLIC PANEL WITH GFR
Anion gap: 8 (ref 5–15)
BUN: 27 mg/dL — ABNORMAL HIGH (ref 8–23)
CO2: 28 mmol/L (ref 22–32)
Calcium: 8.7 mg/dL — ABNORMAL LOW (ref 8.9–10.3)
Chloride: 97 mmol/L — ABNORMAL LOW (ref 98–111)
Creatinine, Ser: 0.95 mg/dL (ref 0.44–1.00)
GFR, Estimated: 59 mL/min — ABNORMAL LOW (ref >60)
Glucose, Bld: 107 mg/dL — ABNORMAL HIGH (ref 70–99)
Potassium: 3.7 mmol/L (ref 3.5–5.1)
Sodium: 133 mmol/L — ABNORMAL LOW (ref 135–145)

## 2024-03-02 LAB — CBC
HCT: 30.9 % — ABNORMAL LOW (ref 36.0–46.0)
Hemoglobin: 10.2 g/dL — ABNORMAL LOW (ref 12.0–15.0)
MCH: 28.7 pg (ref 26.0–34.0)
MCHC: 33 g/dL (ref 30.0–36.0)
MCV: 86.8 fL (ref 80.0–100.0)
Platelets: 199 K/uL (ref 150–400)
RBC: 3.56 MIL/uL — ABNORMAL LOW (ref 3.87–5.11)
RDW: 14.2 % (ref 11.5–15.5)
WBC: 10.8 K/uL — ABNORMAL HIGH (ref 4.0–10.5)
nRBC: 0 % (ref 0.0–0.2)

## 2024-03-02 MED ORDER — IPRATROPIUM-ALBUTEROL 0.5-2.5 (3) MG/3ML IN SOLN
3.0000 mL | Freq: Four times a day (QID) | RESPIRATORY_TRACT | Status: DC
  Administered 2024-03-02 (×2): 3 mL via RESPIRATORY_TRACT
  Filled 2024-03-02 (×2): qty 3

## 2024-03-02 MED ORDER — IPRATROPIUM-ALBUTEROL 0.5-2.5 (3) MG/3ML IN SOLN
3.0000 mL | Freq: Three times a day (TID) | RESPIRATORY_TRACT | Status: DC
  Administered 2024-03-03: 3 mL via RESPIRATORY_TRACT
  Filled 2024-03-02: qty 3

## 2024-03-02 MED ORDER — GUAIFENESIN ER 600 MG PO TB12
600.0000 mg | ORAL_TABLET | Freq: Two times a day (BID) | ORAL | Status: DC
  Administered 2024-03-02 – 2024-03-04 (×5): 600 mg via ORAL
  Filled 2024-03-02 (×5): qty 1

## 2024-03-02 NOTE — Evaluation (Signed)
 Physical Therapy Evaluation Patient Details Name: Karen [name] MRN: [id number] DOB: 1941-01-27 Today's Date: 03/02/2024  History of Present Illness  Pt is 83 yo female admitted on 02/29/24 with acute PE and R LE DVT.  Pt with hx including but not limited to herpes zoster, H. 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 At baseline, pt lives alone and is independent. Reports family can assist at times but not daily.  Today, pt needing min A for bed mobility and CGA to ambulate 100' with RW. She did fatigue easily and moved at slower pace than baseline with mild unsteadiness. Pt expected to progress well and would recommend HHPT at d/c with intermittent support from family. Pt currently with functional limitations due to the deficits listed below (see PT Problem List). Pt will benefit from acute skilled PT to increase their independence and safety with mobility to allow discharge.           If plan is discharge home, recommend the following: A little help with walking and/or transfers;A little help with bathing/dressing/bathroom;Assistance with cooking/housework;Help with stairs or ramp for entrance   Can travel by private vehicle        Equipment Recommendations Rollator (4 wheels)  Recommendations for Other Services       Functional Status Assessment Patient has had a recent decline in their functional status and demonstrates the ability to make significant improvements in function in a reasonable and predictable amount of time.     Precautions / Restrictions Precautions Precautions: Fall      Mobility  Bed Mobility Overal bed mobility: Needs Assistance Bed Mobility: Supine to Sit, Sit to Supine     Supine to sit: Min assist Sit to supine: Supervision         Transfers Overall transfer level: Needs assistance Equipment used: None Transfers: Sit to/from Stand Sit to Stand: Supervision                Ambulation/Gait Ambulation/Gait assistance: Contact guard assist Gait Distance (Feet): 100 Feet Assistive device: Rolling walker (2 wheels) Gait Pattern/deviations: Step-through pattern, Decreased stride length Gait velocity: decreased     General Gait Details: Decreased gait speed, mild shakiness (recently received breathing tx), mild unsteadiness needing RW and CGA  Stairs            Wheelchair Mobility     Tilt Bed    Modified Rankin (Stroke Patients Only)       Balance Overall balance assessment: Needs assistance Sitting-balance support: No upper extremity supported Sitting balance-Leahy Scale: Good     Standing balance support: No upper extremity supported Standing balance-Leahy Scale: Fair Standing balance comment: Could stand wtihout support but felt unsteady taking steps, improved with RW                             Pertinent Vitals/Pain Pain Assessment Pain Assessment: Faces Faces Pain Scale: Hurts little more Pain Location: mid to low back Pain Descriptors / Indicators: Discomfort Pain Intervention(s): Limited activity within patient's tolerance, Monitored during session, Repositioned, Heat applied (reports feels some better after ambulation)    Home Living Family/patient expects to be discharged to:: Private residence Living Arrangements: Alone Available Help at Discharge: Family;Other (Comment) (daughter and son work, sister also sick) Type of Home: Apartment Home Access: Level entry  Home Layout: One level Home Equipment: Grab bars - tub/shower      Prior Function Prior Level of Function : Independent/Modified Independent             Mobility Comments: could ambulate in community ADLs Comments: independent with adls and iadls; does not drive     Extremity/Trunk  Assessment   Upper Extremity Assessment Upper Extremity Assessment: Overall WFL for tasks assessed    Lower Extremity Assessment Lower Extremity Assessment: Overall WFL for tasks assessed    Cervical / Trunk Assessment Cervical / Trunk Assessment: Normal  Communication   Communication Communication: No apparent difficulties    Cognition Arousal: Alert Behavior During Therapy: WFL for tasks assessed/performed   PT - Cognitive impairments: No apparent impairments                         Following commands: Intact       Cueing       General Comments General comments (skin integrity, edema, etc.): Pt on RA.  She did have some wheezing (nursing aware, recently received breathing treatment).  O2 sats were >94% throughout session.  HR 90's    Exercises     Assessment/Plan    PT Assessment Patient needs continued PT services  PT Problem List Decreased strength;Decreased mobility;Decreased activity tolerance;Decreased balance;Decreased knowledge of use of DME;Cardiopulmonary status limiting activity;Pain       PT Treatment Interventions DME instruction;Therapeutic exercise;Stair training;Functional mobility training;Therapeutic activities;Patient/family education;Gait training;Balance training    PT Goals (Current goals can be found in the Care Plan section)  Acute Rehab PT Goals Patient Stated Goal: return home PT Goal Formulation: With patient Time For Goal Achievement: 03/16/24 Potential to Achieve Goals: Good    Frequency Min 3X/week     Co-evaluation               AM-PAC PT 6 Clicks Mobility  Outcome Measure Help needed turning from your back to your side while in a flat bed without using bedrails?: None Help needed moving from lying on your back to sitting on the side of a flat bed without using bedrails?: A Little Help needed moving to and from a bed to a chair (including a wheelchair)?: A Little Help needed standing up from a chair using  your arms (e.g., wheelchair or bedside chair)?: A Little Help needed to walk in hospital room?: A Little Help needed climbing 3-5 steps with a railing? : A Little 6 Click Score: 19    End of Session Equipment Utilized During Treatment: Gait belt Activity Tolerance: Patient tolerated treatment well Patient left: in bed;with call bell/phone within reach;with bed alarm set Nurse Communication: Mobility status PT Visit Diagnosis: Other abnormalities of gait and mobility (R26.89);Muscle weakness (generalized) (M62.81)    Time: 8358-8342 PT Time Calculation (min) (ACUTE ONLY): 16 min   Charges:   PT Evaluation $PT Eval Low Complexity: 1 Low   PT General Charges $$ ACUTE PT VISIT: 1 Visit         Benjiman, PT Acute Rehab [facility] Re[facility] [phone]   Benjiman VEAR Mulberry 03/02/2024, 5:31 PM

## 2024-03-02 NOTE — Progress Notes (Addendum)
 PROGRESS NOTE    Karen [name]  FMW:[id number] DOB: April 11, 1941 DOA: 02/29/2024 PCP: Pcp, No   Brief Narrative: 83 year old with past medical history significant for herpes zoster, H. pylori, hypertension, GERD, iron deficiency anemia, hyperlipidemia, grade 1 diastolic dysfunction, hypertensive heart disease, hepatic cystic disease who was admitted 02/12/2024 until 10/31st/2025 and status post drainage of large liver cyst by IR presents to the ED complaining of right lower back and right upper quadrant abdominal pain initially thought similar to her previous liver cyst.  Subsequent evaluation in the ED CT abdomen and pelvis with contrast show evidence of patient's known numerous liver cysts with interval decrease in size of the previously seen largest cyst in the left lobe post aspiration.  New bilateral proximal lower lobe pulmonary emboli.  New small right pleural effusion with associated hazy right basilar opacity likely atelectasis or less likely infection.   Assessment & Plan:   Principal Problem:   Pulmonary embolism (HCC) Active Problems:   Iron deficiency anemia   Gastroesophageal reflux disease without esophagitis   Essential hypertension   Chronic diastolic heart failure (HCC)   Cystic disease of liver   Acute deep vein thrombosis (DVT) of right peroneal vein (HCC)   Mixed hyperlipidemia  1-Acute BL Pulmonary embolism  Acute deep vein thrombosis right peroneal vein - Echo: Normal EF, normal RV function.  - Doppler: Right consistent with acute deep vein thrombosis involving the right peroneal veins. -Transition to Eliquis.  -pain improving, received IV dilaudid overnight. Plan to monitor on oral tramadol today.  -chest x ray negative.  -PE was diagnosed by CT abdomen pelvis. She will need to be on anticoagulation for DVT as well, with mild increased cr would like to avoid further contrast.  -Continue with  Lydocaine patch, schedule tylenol . Incentive spirometry.  -Chest pain  is pleuritic making PE most likely diagnosis. Could have some component of liver cyst Plan to monitor overnight.  Wean off oxygen today.   Respiratory , Bronchitis.  Develops Upper resp wheezing.  Plan to give her Breathing treatment.  NSL fluids already.  Start Guaifenesin.   Iron deficiency anemia Iron and Tsat low.  Continue with iron supplement.   GERD;Continue with PPI  Hypertension: Holding  Cozaar  and hydrochlorothiazide, SBP 120  Cystic disease of liver: She was referred to hepatologist  Mixed hyperlipidemia  AKI; mild increased cr to 1.0. baseline 0.7 Improved with fluids.   Estimated body mass index is 25.96 kg/m as calculated from the following:   Height as of this encounter: 5' 5 (1.651 m).   Weight as of this encounter: 70.8 kg.   DVT prophylaxis: heparin  Code Status: DNR Family Communication: Daughter and Son at bedside Disposition Plan:  Status is: Observation The patient will require care spanning > 2 midnights and should be moved to inpatient because: management of chest pain, PE<>     Consultants:  none  Procedures:  ECHO Doppler  Antimicrobials:  She is in better spirit. Report improvement of abdominal pain. Tramadol makes her less sleepy.  Not eating much, drinking  ensure. Taking miralax.   Objective: Vitals:   03/01/24 0830 03/01/24 1000 03/01/24 2044 03/02/24 0518  BP: (!) 133/54 (!) 112/54 (!) 133/53 114/61  Pulse: 86 80 76 67  Resp:   18 16  Temp: 98.7 F (37.1 C)  98.5 F (36.9 C) 97.8 F (36.6 C)  TempSrc: Oral  Oral   SpO2:   98% 97%  Weight:      Height:  Intake/Output Summary (Last 24 hours) at 03/02/2024 0735 Last data filed at 03/02/2024 0045 Gross per 24 hour  Intake 1594.28 ml  Output --  Net 1594.28 ml   Filed Weights   02/29/24 1140  Weight: 70.8 kg    Examination:  General exam: NAD Respiratory system: CTA Cardiovascular system: S 1, S 2 RRR Gastrointestinal system: BS present, soft  nt Central nervous system: alert, conversant  Data Reviewed: I have personally reviewed following labs and imaging studies  CBC: Recent Labs  Lab 02/29/24 0956 02/29/24 1006 03/01/24 0607 03/02/24 0603  WBC 12.2*  --  11.4* 10.8*  NEUTROABS 9.4*  --   --   --   HGB 11.8* 12.2 11.1* 10.2*  HCT 36.4 36.0 34.7* 30.9*  MCV 86.9  --  87.6 86.8  PLT 228  --  206 199   Basic Metabolic Panel: Recent Labs  Lab 02/29/24 0956 02/29/24 1006 03/01/24 0809 03/02/24 0603  NA 138 138 136 133*  K 3.6 3.6 3.5 3.7  CL 100 99 98 97*  CO2 29  --  26 28  GLUCOSE 105* 106* 101* 107*  BUN 14 14 21  27*  CREATININE 0.79 0.90 1.02* 0.95  CALCIUM 9.7  --  9.2 8.7*   GFR: Estimated Creatinine Clearance: 43.5 mL/min (by C-G formula based on SCr of 0.95 mg/dL). Liver Function Tests: Recent Labs  Lab 02/29/24 0956 03/01/24 0809  AST 27 32  ALT 20 16  ALKPHOS 85 85  BILITOT 0.8 0.6  PROT 7.8 7.7  ALBUMIN 3.8 3.4*   Recent Labs  Lab 02/29/24 0956  LIPASE 31   No results for input(s): AMMONIA in the last 168 hours. Coagulation Profile: No results for input(s): INR, PROTIME in the last 168 hours. Cardiac Enzymes: No results for input(s): CKTOTAL, CKMB, CKMBINDEX, TROPONINI in the last 168 hours. BNP (last 3 results) No results for input(s): PROBNP in the last 8760 hours. HbA1C: No results for input(s): HGBA1C in the last 72 hours. CBG: No results for input(s): GLUCAP in the last 168 hours. Lipid Profile: No results for input(s): CHOL, HDL, LDLCALC, TRIG, CHOLHDL, LDLDIRECT in the last 72 hours. Thyroid Function Tests: No results for input(s): TSH, T4TOTAL, FREET4, T3FREE, THYROIDAB in the last 72 hours. Anemia Panel: No results for input(s): VITAMINB12, FOLATE, FERRITIN, TIBC, IRON, RETICCTPCT in the last 72 hours. Sepsis Labs: No results for input(s): PROCALCITON, LATICACIDVEN in the last 168 hours.  No results found for  this or any previous visit (from the past 240 hours).       Radiology Studies: DG CHEST PORT 1 VIEW Result Date: 03/01/2024 CLINICAL DATA:  Chest pain. EXAM: PORTABLE CHEST 1 VIEW COMPARISON:  Chest CT 12/15/2023, lung bases from abdominopelvic CT yesterday. FINDINGS: Unchanged elevation of right hemidiaphragm. The cardiomediastinal contours are normal for technique. The lungs are clear. Pulmonary vasculature is normal. Right pleural effusion on CT is not well demonstrated by radiograph. No pulmonary edema. No pneumothorax. No acute osseous abnormalities are seen. IMPRESSION: 1. No acute chest findings. 2. Unchanged elevation of right hemidiaphragm. Electronically Signed   By: Andrea Gasman M.D.   On: 03/01/2024 16:20   ECHOCARDIOGRAM COMPLETE Result Date: 03/01/2024    ECHOCARDIOGRAM REPORT   Patient Name:   Karen [name] Date of Exam: 03/01/2024 Medical Rec #:  [id number]      Height:       65.0 in Accession #:    [id number]     Weight:       156.0  lb Date of Birth:  1940/05/25      BSA:          1.780 m Patient Age:    83 years       BP:           114/56 mmHg Patient Gender: F              HR:           76 bpm. Exam Location:  Inpatient Procedure: 2D Echo, Color Doppler and Cardiac Doppler (Both Spectral and Color            Flow Doppler were utilized during procedure). Indications:    Pulmonary Embolus I26.09  History:        Patient has no prior history of Echocardiogram examinations.                 CHF; Risk Factors:Hypertension.  Sonographer:    Tinnie Gosling RDCS Referring Phys: [phone] DAVID MANUEL ORTIZ  Sonographer Comments: Technically difficult study due to poor echo windows, no apical window and no subcostal window. Pt in lot of pain, unable to position or breathing exercises IMPRESSIONS  1. Left ventricular ejection fraction, by estimation, is 60 to 65%. The left ventricle has normal function. The left ventricle has no regional wall motion abnormalities. There is mild concentric left  ventricular hypertrophy. Left ventricular diastolic function could not be evaluated.  2. Right ventricular systolic function is normal. The right ventricular size is normal. Tricuspid regurgitation signal is inadequate for assessing PA pressure.  3. The mitral valve is normal in structure. No evidence of mitral valve regurgitation. No evidence of mitral stenosis.  4. The aortic valve is normal in structure. Aortic valve regurgitation is not visualized. No aortic stenosis is present.  5. The inferior vena cava is normal in size with greater than 50% respiratory variability, suggesting right atrial pressure of 3 mmHg. FINDINGS  Left Ventricle: Left ventricular ejection fraction, by estimation, is 60 to 65%. The left ventricle has normal function. The left ventricle has no regional wall motion abnormalities. The left ventricular internal cavity size was normal in size. There is  mild concentric left ventricular hypertrophy. Left ventricular diastolic function could not be evaluated. Right Ventricle: The right ventricular size is normal. No increase in right ventricular wall thickness. Right ventricular systolic function is normal. Tricuspid regurgitation signal is inadequate for assessing PA pressure. Left Atrium: Left atrial size was normal in size. Right Atrium: Right atrial size was normal in size. Pericardium: There is no evidence of pericardial effusion. Mitral Valve: The mitral valve is normal in structure. No evidence of mitral valve regurgitation. No evidence of mitral valve stenosis. Tricuspid Valve: The tricuspid valve is normal in structure. Tricuspid valve regurgitation is mild . No evidence of tricuspid stenosis. Aortic Valve: The aortic valve is normal in structure. Aortic valve regurgitation is not visualized. No aortic stenosis is present. Pulmonic Valve: The pulmonic valve was normal in structure. Pulmonic valve regurgitation is not visualized. No evidence of pulmonic stenosis. Aorta: The aortic root  is normal in size and structure. Venous: The inferior vena cava is normal in size with greater than 50% respiratory variability, suggesting right atrial pressure of 3 mmHg. IAS/Shunts: No atrial level shunt detected by color flow Doppler.  LEFT VENTRICLE PLAX 2D LVIDd:         4.00 cm LVIDs:         2.50 cm LV PW:         1.10  cm LV IVS:        1.00 cm LVOT diam:     1.80 cm LV SV:         51 LV SV Index:   29 LVOT Area:     2.54 cm LV IVRT:       92 msec  IVC IVC diam: 2.10 cm LEFT ATRIUM           Index LA diam:      4.00 cm 2.25 cm/m LA Vol (A4C): 30.1 ml 16.91 ml/m  AORTIC VALVE LVOT Vmax:   108.00 cm/s LVOT Vmean:  75.800 cm/s LVOT VTI:    0.202 m  AORTA Ao Root diam: 3.20 cm Ao Asc diam:  3.10 cm TRICUSPID VALVE TR Peak grad:   18.0 mmHg TR Vmax:        212.00 cm/s  SHUNTS Systemic VTI:  0.20 m Systemic Diam: 1.80 cm Kardie Tobb DO Electronically signed by Dub Huntsman DO Signature Date/Time: 03/01/2024/1:51:19 PM    Final    VAS US  LOWER EXTREMITY VENOUS (DVT) Result Date: 02/29/2024  Lower Venous DVT Study Patient Name:  Karen [name]  Date of Exam:   02/29/2024 Medical Rec #: [id number]       Accession #:    [id number] Date of Birth: [birth year]       Patient Gender: F Patient Age:   75 years Exam Location:  [facility] Procedure:      VAS US  LOWER EXTREMITY VENOUS (DVT) Referring Phys: DAVID ORTIZ --------------------------------------------------------------------------------  Indications: Pulmonary embolism, and Right sided pain.  Comparison Study: No prior exam. Performing Technologist: Edilia Elden Appl  Examination Guidelines: A complete evaluation includes B-mode imaging, spectral Doppler, color Doppler, and power Doppler as needed of all accessible portions of each vessel. Bilateral testing is considered an integral part of a complete examination. Limited examinations for reoccurring indications may be performed as noted. The reflux portion of the exam is performed with the patient in  reverse Trendelenburg.  +---------+---------------+---------+-----------+----------+--------------+ RIGHT    CompressibilityPhasicitySpontaneityPropertiesThrombus Aging +---------+---------------+---------+-----------+----------+--------------+ CFV      Full           Yes      Yes                                 +---------+---------------+---------+-----------+----------+--------------+ SFJ      Full           Yes      Yes                                 +---------+---------------+---------+-----------+----------+--------------+ FV Prox  Full                                                        +---------+---------------+---------+-----------+----------+--------------+ FV Mid   Full                                                        +---------+---------------+---------+-----------+----------+--------------+ FV DistalFull                                                        +---------+---------------+---------+-----------+----------+--------------+  PFV      Full                                                        +---------+---------------+---------+-----------+----------+--------------+ POP      Full           Yes      Yes                                 +---------+---------------+---------+-----------+----------+--------------+ PTV      Full                                                        +---------+---------------+---------+-----------+----------+--------------+ PERO     None           No       No                                  +---------+---------------+---------+-----------+----------+--------------+ Deep vein thrombosis noted in one of the paired peroneal veins of the right lower extremity.  +---------+---------------+---------+-----------+----------+--------------+ LEFT     CompressibilityPhasicitySpontaneityPropertiesThrombus Aging +---------+---------------+---------+-----------+----------+--------------+ CFV       Full           Yes      Yes                                 +---------+---------------+---------+-----------+----------+--------------+ SFJ      Full           Yes      Yes                                 +---------+---------------+---------+-----------+----------+--------------+ FV Prox  Full                                                        +---------+---------------+---------+-----------+----------+--------------+ FV Mid   Full                                                        +---------+---------------+---------+-----------+----------+--------------+ FV DistalFull                                                        +---------+---------------+---------+-----------+----------+--------------+ PFV      Full                                                        +---------+---------------+---------+-----------+----------+--------------+  POP      Full           Yes      Yes                                 +---------+---------------+---------+-----------+----------+--------------+ PTV      Full                                                        +---------+---------------+---------+-----------+----------+--------------+ PERO     Full                                                        +---------+---------------+---------+-----------+----------+--------------+     Summary: RIGHT: - Findings consistent with acute deep vein thrombosis involving the right peroneal veins.  - No cystic structure found in the popliteal fossa.  LEFT: - No evidence of deep vein thrombosis in the lower extremity. No indirect evidence of obstruction proximal to the inguinal ligament.  - No cystic structure found in the popliteal fossa.  *See table(s) above for measurements and observations. Electronically signed by Debby Robertson on 02/29/2024 at 4:47:31 PM.    Final    CT ABDOMEN PELVIS W CONTRAST Result Date: 02/29/2024 CLINICAL DATA:  Right-sided abdominal  pain beginning yesterday. Recent aspiration of liver cysts 02/14/2024. EXAM: CT ABDOMEN AND PELVIS WITH CONTRAST TECHNIQUE: Multidetector CT imaging of the abdomen and pelvis was performed using the standard protocol following bolus administration of intravenous contrast. RADIATION DOSE REDUCTION: This exam was performed according to the departmental dose-optimization program which includes automated exposure control, adjustment of the mA and/or kV according to patient size and/or use of iterative reconstruction technique. CONTRAST:  OMNIPAQUE  IOHEXOL  300 MG/ML  SOLN COMPARISON:  02/12/2024 FINDINGS: Lower chest: Borderline stable cardiomegaly calcified plaque over the descending thoracic aorta. There are bilateral proximal lower lobar pulmonary emboli noted. These are new compared to the recent prior exams. RV/LV ratio less than 1. Small sliding hiatal hernia unchanged. Visualized lung bases demonstrate a new small right pleural effusion with associated hazy right basilar opacification likely atelectasis and less likely infection. Hepatobiliary: Evidence of patient's known numerous liver cysts with interval decrease in size of the previously seen largest cyst in the left lobe now measuring 8.2 cm (previously 11.6 cm). Remaining liver cysts are not significantly changed. Gallbladder and biliary tree are unremarkable. Pancreas: Normal. Spleen: Normal. Adrenals/Urinary Tract: Adrenal glands are normal. Kidneys are normal size without hydronephrosis or nephrolithiasis. Stable upper pole 2.7 cm left renal cyst. Ureters and bladder are normal. Stomach/Bowel: Small sliding hiatal hernia unchanged. Stomach is otherwise unremarkable. Small bowel is normal. Appendix is normal. Moderate diverticulosis of the sigmoid colon as the colon is otherwise unremarkable. Vascular/Lymphatic: Calcified plaque over the abdominal aorta which is normal caliber. Remaining vascular structures are unremarkable. No adenopathy.  Reproductive: Uterus and bilateral adnexa are unremarkable. Other: No significant free peritoneal fluid or focal inflammatory change. Small umbilical hernia containing only peritoneal fat. Small right inguinal hernia containing only peritoneal fat. Musculoskeletal: No focal abnormality. Stable grade 1 anterolisthesis of L4 on L5. IMPRESSION: 1. Evidence of patient's  known numerous liver cysts with interval decrease in size of the previously seen largest cyst in the left lobe post aspiration now measuring 8.2 cm (previously 11.6 cm). Remaining liver cysts are not significantly changed. 2. New bilateral proximal lower lobar pulmonary emboli. 3. New small right pleural effusion with associated hazy right basilar opacification likely atelectasis and less likely infection. 4. Small sliding hiatal hernia unchanged. 5. Moderate diverticulosis of the sigmoid colon without active inflammation. 6. Small umbilical and right inguinal hernias containing only peritoneal fat. 7. Aortic atherosclerosis. Aortic Atherosclerosis (ICD10-I70.0). Critical Value/emergent results were called by telephone at the time of interpretation on 02/29/2024 at 11:48 am to provider [facility] , who verbally acknowledged these results. Electronically Signed   By: Toribio Agreste M.D.   On: 02/29/2024 11:52        Scheduled Meds:  acetaminophen   500 mg Oral TID   apixaban  10 mg Oral BID   Followed by   NOREEN ON 03/08/2024] apixaban  5 mg Oral BID   feeding supplement  237 mL Oral BID BM   ferrous sulfate  325 mg Oral Q breakfast   Influenza vac split trivalent PF  0.5 mL Intramuscular Tomorrow-1000   lidocaine  1 patch Transdermal Daily   ondansetron  (ZOFRAN ) IV  4 mg Intravenous Once   pantoprazole   40 mg Oral Daily   polyethylene glycol  17 g Oral BID   senna-docusate  1 tablet Oral BID   Continuous Infusions:     LOS: 0 days    Time spent: 35 Minutes    Tiziana Cislo A Ziyana Morikawa, MD [facility]   If 7PM-7AM, please  contact night-coverage [email or web address]  03/02/2024, 7:35 AM

## 2024-03-03 ENCOUNTER — Encounter (HOSPITAL_COMMUNITY): Payer: Self-pay

## 2024-03-03 ENCOUNTER — Observation Stay (HOSPITAL_COMMUNITY)

## 2024-03-03 DIAGNOSIS — I2699 Other pulmonary embolism without acute cor pulmonale: Secondary | ICD-10-CM | POA: Diagnosis not present

## 2024-03-03 LAB — CBC
HCT: 31.1 % — ABNORMAL LOW (ref 36.0–46.0)
Hemoglobin: 10 g/dL — ABNORMAL LOW (ref 12.0–15.0)
MCH: 27.7 pg (ref 26.0–34.0)
MCHC: 32.2 g/dL (ref 30.0–36.0)
MCV: 86.1 fL (ref 80.0–100.0)
Platelets: 220 K/uL (ref 150–400)
RBC: 3.61 MIL/uL — ABNORMAL LOW (ref 3.87–5.11)
RDW: 14.2 % (ref 11.5–15.5)
WBC: 8.1 K/uL (ref 4.0–10.5)
nRBC: 0 % (ref 0.0–0.2)

## 2024-03-03 LAB — BASIC METABOLIC PANEL WITH GFR
Anion gap: 9 (ref 5–15)
BUN: 16 mg/dL (ref 8–23)
CO2: 29 mmol/L (ref 22–32)
Calcium: 9.1 mg/dL (ref 8.9–10.3)
Chloride: 100 mmol/L (ref 98–111)
Creatinine, Ser: 0.8 mg/dL (ref 0.44–1.00)
GFR, Estimated: >60 mL/min (ref >60)
Glucose, Bld: 122 mg/dL — ABNORMAL HIGH (ref 70–99)
Potassium: 4.4 mmol/L (ref 3.5–5.1)
Sodium: 137 mmol/L (ref 135–145)

## 2024-03-03 MED ORDER — IPRATROPIUM-ALBUTEROL 0.5-2.5 (3) MG/3ML IN SOLN: 3.0000 mL | Freq: Four times a day (QID) | RESPIRATORY_TRACT | Status: DC | PRN

## 2024-03-03 MED ORDER — IPRATROPIUM-ALBUTEROL 0.5-2.5 (3) MG/3ML IN SOLN
3.0000 mL | Freq: Three times a day (TID) | RESPIRATORY_TRACT | Status: DC
  Administered 2024-03-03 – 2024-03-07 (×12): 3 mL via RESPIRATORY_TRACT
  Filled 2024-03-03 (×12): qty 3

## 2024-03-03 MED ORDER — SODIUM CHLORIDE 0.9 % IV SOLN
500.0000 mg | INTRAVENOUS | Status: DC
  Administered 2024-03-03 – 2024-03-04 (×2): 500 mg via INTRAVENOUS
  Filled 2024-03-03 (×2): qty 5

## 2024-03-03 MED ORDER — SODIUM CHLORIDE 0.9 % IV SOLN
2.0000 g | INTRAVENOUS | Status: DC
  Administered 2024-03-03 – 2024-03-04 (×2): 2 g via INTRAVENOUS
  Filled 2024-03-03 (×2): qty 20

## 2024-03-03 MED ORDER — NALOXONE HCL 0.4 MG/ML IJ SOLN
INTRAMUSCULAR | Status: AC
  Filled 2024-03-03: qty 1

## 2024-03-03 MED ORDER — IOHEXOL 350 MG/ML SOLN
75.0000 mL | Freq: Once | INTRAVENOUS | Status: AC | PRN
  Administered 2024-03-03: 75 mL via INTRAVENOUS

## 2024-03-03 NOTE — TOC Progression Note (Signed)
 Transition of Care [facility]) - Progression Note    Patient Details  Name: Karen [name] MRN: [id number] Date of Birth: 12-08-1940  [facility] CM/SW Contact  Heather DELENA Saltness, LCSW Phone Number: 03/03/2024, 1:24 PM  Clinical Narrative:    CSW met with pt at bedside to discuss PT's recommendation for [facility] PT services upon discharge. Pt agreeable to recommendation. Pt does not have PCP listed in chart. CSW unable to set up [facility] services due to pt not having established PCP. Pt has active [facility] Medicare. CSW attached list of [facility] clinics in [place] to AVS. CSW advised pt to schedule PCP appointment upon discharge.    Expected Discharge Plan and Services  Home    Social Drivers of Health (SDOH) Interventions SDOH Screenings   Food Insecurity: No Food Insecurity (02/29/2024)  Housing: Low Risk  (02/29/2024)  Transportation Needs: No Transportation Needs (02/29/2024)  Utilities: Not At Risk (02/29/2024)  Alcohol Screen: Low Risk  (11/03/2021)  Depression (PHQ2-9): Low Risk  (11/03/2021)  Financial Resource Strain: Low Risk  (11/03/2021)  Physical Activity: Sufficiently Active (11/03/2021)  Social Connections: Moderately Integrated (02/29/2024)  Stress: No Stress Concern Present (11/03/2021)  Tobacco Use: Low Risk  (03/03/2024)    Readmission Risk Interventions     No data to display           Signed: Heather Saltness, MSW, LCSW Clinical Social Worker Inpatient Care Management 03/03/2024 1:26 PM

## 2024-03-03 NOTE — Plan of Care (Signed)

## 2024-03-03 NOTE — Evaluation (Signed)
 Occupational Therapy Evaluation Patient Details Name: Karen [name] MRN: [id number] DOB: 1941-04-02 Today's Date: 03/03/2024   History of Present Illness   Pt is 83 yo female admitted on 02/29/24 with acute PE and R LE DVT.  Pt with hx including but not limited to herpes zoster, H. pylori, hypertension, GERD, iron deficiency anemia, hyperlipidemia, grade 1 diastolic dysfunction, hypertensive heart disease, hepatic cystic disease with recent admission for aspiration of cyst     Clinical Impressions Pt reports PTA, she was independent with ADL and functional mobility and her dtr assisted with IADL as needed. Pt reports she was living alone. Pt currently requires cga for functional mobility without AD. She is limited due to pain in R ribs and R side of back. Pt will continue to benefit from skilled OT services during admission. Anticipate pt will continue to progress toward prior level of functioning, do not anticipate she will OT services following d/c.      If plan is discharge home, recommend the following:   A little help with walking and/or transfers;A little help with bathing/dressing/bathroom     Functional Status Assessment   Patient has had a recent decline in their functional status and demonstrates the ability to make significant improvements in function in a reasonable and predictable amount of time.     Equipment Recommendations   Tub/shower bench     Recommendations for Other Services         Precautions/Restrictions   Precautions Precautions: Fall Restrictions Weight Bearing Restrictions Per Provider Order: No     Mobility Bed Mobility Overal bed mobility: Needs Assistance Bed Mobility: Supine to Sit, Sit to Supine     Supine to sit: Contact guard Sit to supine: Supervision   General bed mobility comments: pt with increased pain during transfers    Transfers Overall transfer level: Needs assistance Equipment used: None Transfers: Sit to/from  Stand Sit to Stand: Supervision                  Balance Overall balance assessment: Needs assistance Sitting-balance support: No upper extremity supported Sitting balance-Leahy Scale: Good     Standing balance support: No upper extremity supported Standing balance-Leahy Scale: Fair                             ADL either performed or assessed with clinical judgement   ADL Overall ADL's : Needs assistance/impaired Eating/Feeding: Independent   Grooming: Contact guard assist;Standing   Upper Body Bathing: Set up;Sitting   Lower Body Bathing: Contact guard assist;Sit to/from stand   Upper Body Dressing : Set up;Sitting   Lower Body Dressing: Contact guard assist;Sit to/from stand   Toilet Transfer: Contact guard assist;Ambulation   Toileting- Clothing Manipulation and Hygiene: Contact guard assist   Tub/ Engineer, Structural: Minimal Radiation Protection Practitioner Details (indicate cue type and reason): to simulate transfer over tub;pt reports her tub ledge is very high Functional mobility during ADLs: Contact guard assist General ADL Comments: ambulated in the hallway with cga     Vision         Perception         Praxis         Pertinent Vitals/Pain Pain Assessment Pain Assessment: Faces Faces Pain Scale: Hurts even more Pain Location: mid to low back Pain Descriptors / Indicators: Discomfort Pain Intervention(s): Limited activity within patient's tolerance, Monitored during session     Extremity/Trunk Assessment Upper Extremity Assessment Upper Extremity Assessment:  Overall WFL for tasks assessed   Lower Extremity Assessment Lower Extremity Assessment: Overall WFL for tasks assessed   Cervical / Trunk Assessment Cervical / Trunk Assessment: Normal   Communication Communication Communication: No apparent difficulties   Cognition Arousal: Alert Behavior During Therapy: WFL for tasks assessed/performed Cognition: No apparent  impairments                               Following commands: Intact       Cueing  General Comments   Cueing Techniques: Verbal cues  pt on RA, HR during mobility 88bpm-100bpm   Exercises     Shoulder Instructions      Home Living Family/patient expects to be discharged to:: Private residence Living Arrangements: Alone Available Help at Discharge: Family;Other (Comment) (daughter and son work, sister also sick) Type of Home: Apartment Home Access: Level entry     Home Layout: One level     Bathroom Shower/Tub: Chief Strategy Officer: Handicapped height     Home Equipment: Grab bars - tub/shower          Prior Functioning/Environment Prior Level of Function : Independent/Modified Independent             Mobility Comments: could ambulate in community ADLs Comments: independent with adls and iadls; does not drive, her dtr can assist as needed    OT Problem List: Decreased activity tolerance;Impaired balance (sitting and/or standing);Pain   OT Treatment/Interventions: Self-care/ADL training;Therapeutic exercise;Energy conservation;Therapeutic activities;Patient/family education      OT Goals(Current goals can be found in the care plan section)   Acute Rehab OT Goals Patient Stated Goal: to go home OT Goal Formulation: With patient Time For Goal Achievement: 03/17/24 Potential to Achieve Goals: Good ADL Goals Pt Will Perform Grooming: Independently;standing Pt Will Perform Lower Body Dressing: Independently;sit to/from stand Pt Will Transfer to Toilet: Independently;ambulating Pt Will Perform Tub/Shower Transfer: with modified independence   OT Frequency:  Min 2X/week    Co-evaluation              AM-PAC OT 6 Clicks Daily Activity     Outcome Measure Help from another person eating meals?: None Help from another person taking care of personal grooming?: A Little Help from another person toileting, which includes  using toliet, bedpan, or urinal?: A Little Help from another person bathing (including washing, rinsing, drying)?: A Little Help from another person to put on and taking off regular upper body clothing?: A Little Help from another person to put on and taking off regular lower body clothing?: A Little 6 Click Score: 19   End of Session Nurse Communication: Mobility status  Activity Tolerance: Patient tolerated treatment well Patient left: in bed;with call bell/phone within reach;with bed alarm set  OT Visit Diagnosis: Other abnormalities of gait and mobility (R26.89);Pain Pain - Right/Left: Right Pain - part of body:  (flank pain)                Time: 8840-8786 OT Time Calculation (min): 14 min Charges:  OT General Charges $OT Visit: 1 Visit OT Evaluation $OT Eval Low Complexity: 1 Low  Shayona Hibbitts OTR/L Acute Rehabilitation Services Office: [phone]   Verneita ONEIDA Moose 03/03/2024, 1:02 PM

## 2024-03-03 NOTE — Progress Notes (Addendum)
 PROGRESS NOTE    Karen [name]  FMW:[id number] DOB: [birth year] DOA: 02/29/2024 PCP: Pcp, No   Brief Narrative: 83 year old with past medical history significant for herpes zoster, H. pylori, hypertension, GERD, iron deficiency anemia, hyperlipidemia, grade 1 diastolic dysfunction, hypertensive heart disease, hepatic cystic disease who was admitted 02/12/2024 until 10/31st/2025 and status post drainage of large liver cyst by IR presents to the ED complaining of right lower back and right upper quadrant abdominal pain initially thought similar to her previous liver cyst.  Subsequent evaluation in the ED CT abdomen and pelvis with contrast show evidence of patient's known numerous liver cysts with interval decrease in size of the previously seen largest cyst in the left lobe post aspiration.  New bilateral proximal lower lobe pulmonary emboli.  New small right pleural effusion with associated hazy right basilar opacity likely atelectasis or less likely infection.   Assessment & Plan:   Principal Problem:   Pulmonary embolism (HCC) Active Problems:   Iron deficiency anemia   Gastroesophageal reflux disease without esophagitis   Essential hypertension   Chronic diastolic heart failure (HCC)   Cystic disease of liver   Acute deep vein thrombosis (DVT) of right peroneal vein (HCC)   Mixed hyperlipidemia  1-Acute BL Pulmonary embolism  Acute deep vein thrombosis right peroneal vein - Echo: Normal EF, normal RV function.  - Doppler: Right consistent with acute deep vein thrombosis involving the right peroneal veins. -Transition to Eliquis.  -pain improving, received IV dilaudid overnight. Plan to monitor on oral tramadol today.  -chest x ray negative.  -PE was diagnosed by CT abdomen pelvis. She will need to be on anticoagulation for DVT as well, with mild increased cr would like to avoid further contrast.  -Continue with  Lydocaine patch, schedule tylenol . Incentive spirometry.  -Chest pain  is pleuritic making PE most likely diagnosis. Could have some component of liver cyst -She has been off oxygen, she does report more chest pain right upper part, report more cough/ , I will proceed with CTA chest to evaluate size of PE and to ruled out PNA.  Respiratory , Bronchitis.  Develops Upper resp wheezing.  Continue with  NSL fluids already.  Continue with Guaifenesin.   Iron deficiency anemia Iron and Tsat low.  Continue with iron supplement.   GERD;Continue with PPI  Hypertension: Holding  Cozaar  and hydrochlorothiazide, SBP 120  Cystic disease of liver: She was referred to hepatologist  Mixed hyperlipidemia  AKI; mild increased cr to 1.0. baseline 0.7 Improved with fluids.   Estimated body mass index is 25.96 kg/m as calculated from the following:   Height as of this encounter: 5' 5 (1.651 m).   Weight as of this encounter: 70.8 kg.   DVT prophylaxis: heparin  Code Status: DNR Family Communication: Discussed with son over phone  Disposition Plan:  Status is: Observation The patient will require care spanning > 2 midnights and should be moved to inpatient because: management of chest pain, PE<>     Consultants:  none  Procedures:  ECHO Doppler  Antimicrobials:   Subjective: She report more pain upper part of her chest and worsening cough.  She is off of oxygen. She is not feeling well today   Objective: Vitals:   03/02/24 2128 03/03/24 0530 03/03/24 0755 03/03/24 1223  BP: (!) 157/92 137/65  133/61  Pulse: 84 72  77  Resp: 18 18  20   Temp: 98.2 F (36.8 C) 98 F (36.7 C)  98.7 F (37.1 C)  TempSrc:  Oral Oral    SpO2:   96%   Weight:      Height:        Intake/Output Summary (Last 24 hours) at 03/03/2024 1518 Last data filed at 03/03/2024 0850 Gross per 24 hour  Intake 360 ml  Output --  Net 360 ml   Filed Weights   02/29/24 1140  Weight: 70.8 kg    Examination:  General exam: NAD Respiratory system: BL  ronchus. Cardiovascular system: S 1, S 2 RRR Gastrointestinal system: BS present, soft, nt Central nervous system: alert, conversant  Data Reviewed: I have personally reviewed following labs and imaging studies  CBC: Recent Labs  Lab 02/29/24 0956 02/29/24 1006 03/01/24 0607 03/02/24 0603 03/03/24 1121  WBC 12.2*  --  11.4* 10.8* 8.1  NEUTROABS 9.4*  --   --   --   --   HGB 11.8* 12.2 11.1* 10.2* 10.0*  HCT 36.4 36.0 34.7* 30.9* 31.1*  MCV 86.9  --  87.6 86.8 86.1  PLT 228  --  206 199 220   Basic Metabolic Panel: Recent Labs  Lab 02/29/24 0956 02/29/24 1006 03/01/24 0809 03/02/24 0603 03/03/24 1121  NA 138 138 136 133* 137  K 3.6 3.6 3.5 3.7 4.4  CL 100 99 98 97* 100  CO2 29  --  26 28 29   GLUCOSE 105* 106* 101* 107* 122*  BUN 14 14 21  27* 16  CREATININE 0.79 0.90 1.02* 0.95 0.80  CALCIUM 9.7  --  9.2 8.7* 9.1   GFR: Estimated Creatinine Clearance: 51.6 mL/min (by C-G formula based on SCr of 0.8 mg/dL). Liver Function Tests: Recent Labs  Lab 02/29/24 0956 03/01/24 0809  AST 27 32  ALT 20 16  ALKPHOS 85 85  BILITOT 0.8 0.6  PROT 7.8 7.7  ALBUMIN 3.8 3.4*   Recent Labs  Lab 02/29/24 0956  LIPASE 31   No results for input(s): AMMONIA in the last 168 hours. Coagulation Profile: No results for input(s): INR, PROTIME in the last 168 hours. Cardiac Enzymes: No results for input(s): CKTOTAL, CKMB, CKMBINDEX, TROPONINI in the last 168 hours. BNP (last 3 results) No results for input(s): PROBNP in the last 8760 hours. HbA1C: No results for input(s): HGBA1C in the last 72 hours. CBG: No results for input(s): GLUCAP in the last 168 hours. Lipid Profile: No results for input(s): CHOL, HDL, LDLCALC, TRIG, CHOLHDL, LDLDIRECT in the last 72 hours. Thyroid Function Tests: No results for input(s): TSH, T4TOTAL, FREET4, T3FREE, THYROIDAB in the last 72 hours. Anemia Panel: No results for input(s): VITAMINB12,  FOLATE, FERRITIN, TIBC, IRON, RETICCTPCT in the last 72 hours. Sepsis Labs: No results for input(s): PROCALCITON, LATICACIDVEN in the last 168 hours.  No results found for this or any previous visit (from the past 240 hours).       Radiology Studies: No results found.       Scheduled Meds:  naloxone       acetaminophen   500 mg Oral TID   apixaban  10 mg Oral BID   Followed by   NOREEN ON 03/08/2024] apixaban  5 mg Oral BID   feeding supplement  237 mL Oral BID BM   ferrous sulfate  325 mg Oral Q breakfast   guaiFENesin  600 mg Oral BID   Influenza vac split trivalent PF  0.5 mL Intramuscular Tomorrow-1000   lidocaine  1 patch Transdermal Daily   pantoprazole   40 mg Oral Daily   polyethylene glycol  17 g Oral BID  senna-docusate  1 tablet Oral BID   Continuous Infusions:     LOS: 0 days    Time spent: 35 Minutes    Daivd Fredericksen A Keilany Burnette, MD [facility]   If 7PM-7AM, please contact night-coverage [email or web address]  03/03/2024, 3:18 PM

## 2024-03-04 ENCOUNTER — Telehealth: Payer: Self-pay | Admitting: Pulmonary Disease

## 2024-03-04 DIAGNOSIS — E782 Mixed hyperlipidemia: Secondary | ICD-10-CM | POA: Diagnosis present

## 2024-03-04 DIAGNOSIS — Z7901 Long term (current) use of anticoagulants: Secondary | ICD-10-CM | POA: Diagnosis not present

## 2024-03-04 DIAGNOSIS — Z888 Allergy status to other drugs, medicaments and biological substances status: Secondary | ICD-10-CM | POA: Diagnosis not present

## 2024-03-04 DIAGNOSIS — J189 Pneumonia, unspecified organism: Secondary | ICD-10-CM | POA: Diagnosis present

## 2024-03-04 DIAGNOSIS — N179 Acute kidney failure, unspecified: Secondary | ICD-10-CM | POA: Diagnosis present

## 2024-03-04 DIAGNOSIS — Z83438 Family history of other disorder of lipoprotein metabolism and other lipidemia: Secondary | ICD-10-CM | POA: Diagnosis not present

## 2024-03-04 DIAGNOSIS — Z79899 Other long term (current) drug therapy: Secondary | ICD-10-CM | POA: Diagnosis not present

## 2024-03-04 DIAGNOSIS — J90 Pleural effusion, not elsewhere classified: Secondary | ICD-10-CM

## 2024-03-04 DIAGNOSIS — J40 Bronchitis, not specified as acute or chronic: Secondary | ICD-10-CM | POA: Diagnosis not present

## 2024-03-04 DIAGNOSIS — Z8249 Family history of ischemic heart disease and other diseases of the circulatory system: Secondary | ICD-10-CM | POA: Diagnosis not present

## 2024-03-04 DIAGNOSIS — R195 Other fecal abnormalities: Secondary | ICD-10-CM | POA: Diagnosis not present

## 2024-03-04 DIAGNOSIS — I110 Hypertensive heart disease with heart failure: Secondary | ICD-10-CM | POA: Diagnosis present

## 2024-03-04 DIAGNOSIS — K921 Melena: Secondary | ICD-10-CM | POA: Diagnosis not present

## 2024-03-04 DIAGNOSIS — I2699 Other pulmonary embolism without acute cor pulmonale: Secondary | ICD-10-CM | POA: Diagnosis present

## 2024-03-04 DIAGNOSIS — Z23 Encounter for immunization: Secondary | ICD-10-CM | POA: Diagnosis present

## 2024-03-04 DIAGNOSIS — K7689 Other specified diseases of liver: Secondary | ICD-10-CM | POA: Diagnosis present

## 2024-03-04 DIAGNOSIS — Z66 Do not resuscitate: Secondary | ICD-10-CM | POA: Diagnosis present

## 2024-03-04 DIAGNOSIS — I82451 Acute embolism and thrombosis of right peroneal vein: Secondary | ICD-10-CM | POA: Diagnosis present

## 2024-03-04 DIAGNOSIS — D509 Iron deficiency anemia, unspecified: Secondary | ICD-10-CM | POA: Diagnosis present

## 2024-03-04 DIAGNOSIS — I5032 Chronic diastolic (congestive) heart failure: Secondary | ICD-10-CM | POA: Diagnosis present

## 2024-03-04 DIAGNOSIS — K219 Gastro-esophageal reflux disease without esophagitis: Secondary | ICD-10-CM | POA: Diagnosis present

## 2024-03-04 LAB — CBC
HCT: 34 % — ABNORMAL LOW (ref 36.0–46.0)
Hemoglobin: 10.2 g/dL — ABNORMAL LOW (ref 12.0–15.0)
MCH: 28.3 pg (ref 26.0–34.0)
MCHC: 30 g/dL (ref 30.0–36.0)
MCV: 94.4 fL (ref 80.0–100.0)
Platelets: 239 K/uL (ref 150–400)
RBC: 3.6 MIL/uL — ABNORMAL LOW (ref 3.87–5.11)
RDW: 14.5 % (ref 11.5–15.5)
WBC: 6.1 K/uL (ref 4.0–10.5)
nRBC: 0 % (ref 0.0–0.2)

## 2024-03-04 LAB — BASIC METABOLIC PANEL WITH GFR
Anion gap: 9 (ref 5–15)
BUN: 14 mg/dL (ref 8–23)
CO2: 29 mmol/L (ref 22–32)
Calcium: 9 mg/dL (ref 8.9–10.3)
Chloride: 99 mmol/L (ref 98–111)
Creatinine, Ser: 0.8 mg/dL (ref 0.44–1.00)
GFR, Estimated: >60 mL/min (ref >60)
Glucose, Bld: 118 mg/dL — ABNORMAL HIGH (ref 70–99)
Potassium: 4.8 mmol/L (ref 3.5–5.1)
Sodium: 138 mmol/L (ref 135–145)

## 2024-03-04 MED ORDER — GUAIFENESIN ER 600 MG PO TB12
1200.0000 mg | ORAL_TABLET | Freq: Two times a day (BID) | ORAL | Status: DC
  Administered 2024-03-05 – 2024-03-07 (×5): 1200 mg via ORAL
  Filled 2024-03-04 (×5): qty 2

## 2024-03-04 MED ORDER — BISACODYL 10 MG RE SUPP: 10.0000 mg | Freq: Once | RECTAL | Status: DC

## 2024-03-04 MED ORDER — GUAIFENESIN ER 600 MG PO TB12: 1200.0000 mg | ORAL_TABLET | Freq: Two times a day (BID) | ORAL | Status: DC

## 2024-03-04 MED ORDER — GUAIFENESIN ER 600 MG PO TB12
600.0000 mg | ORAL_TABLET | Freq: Once | ORAL | Status: AC
  Administered 2024-03-04: 600 mg via ORAL
  Filled 2024-03-04: qty 1

## 2024-03-04 NOTE — Telephone Encounter (Signed)
 Needs pulmonary medicine follow up for PE and pleural effusion.

## 2024-03-04 NOTE — Plan of Care (Signed)
  Problem: Education: Goal: Knowledge of General Education information will improve Description: Including pain rating scale, medication(s)/side effects and non-pharmacologic comfort measures Outcome: Progressing   Problem: Health Behavior/Discharge Planning: Goal: Ability to manage health-related needs will improve Outcome: Progressing   Problem: Coping: Goal: Level of anxiety will decrease Outcome: Progressing   Problem: Elimination: Goal: Will not experience complications related to bowel motility Outcome: Progressing Goal: Will not experience complications related to urinary retention Outcome: Progressing   Problem: Pain Managment: Goal: General experience of comfort will improve and/or be controlled Outcome: Progressing   Problem: Safety: Goal: Ability to remain free from injury will improve Outcome: Progressing   Problem: Skin Integrity: Goal: Risk for impaired skin integrity will decrease Outcome: Progressing

## 2024-03-04 NOTE — Progress Notes (Signed)
 Mobility Specialist - Progress Note   03/04/24 1021  Mobility  Activity Ambulated with assistance  Level of Assistance Contact guard assist, steadying assist  Assistive Device Front wheel walker  Distance Ambulated (ft) 200 ft  Range of Motion/Exercises Active  Activity Response Tolerated well  Mobility visit 1 Mobility  Mobility Specialist Start Time (ACUTE ONLY) 1010  Mobility Specialist Stop Time (ACUTE ONLY) 1021  Mobility Specialist Time Calculation (min) (ACUTE ONLY) 11 min   Pt was found in bed and agreeable to mobilize. No complaints. At EOS returned to recliner chair with all needs met. Call bell in reach.   Erminio Leos,  Mobility Specialist Can be reached via Secure Chat

## 2024-03-04 NOTE — Consult Note (Signed)
 NAME:  Karen [name], MRN:  [id number], DOB:  11-09-1940, LOS: 0 ADMISSION DATE:  02/29/2024, CONSULTATION DATE:  @TODAY @ REFERRING MD:  Dr. Madelyne, CHIEF COMPLAINT:  Pleural Effusion   History of Present Illness:   Karen [name] is an 83 y/o F with a PMH significant for HTN, GERD, IDA, HLD, and hepatic cysts who presents for R sided pleuritic chest pain found to have low risk PE and pleural effusion. Pulmonology consulted for evaluation and management of the latter.  The patient presents with right leg pain and shortness of breath. A few weeks ago, they were hospitalized for five days due to liver cysts, during which fluid was drained. After discharge, they returned to the hospital with severe right leg pain, which was diagnosed as a blood clot. They were started on blood thinners, which have improved the pain, although it has started to 'go up'. Two days ago, they began experiencing a cough and a 'funny whistle' in their chest. The patient reports that they were told after a CT scan that they have pneumonia and a little fluid in their lung. The pain worsens with deep breaths, particularly on the right side, and they have been receiving breathing treatments that provide temporary relief. No fever, chills, or night sweats. They have not undergone any recent surgeries, long car rides, or plane rides, and they are not on hormone medications. They have never smoked. Their family history includes a mother who had Alzheimer's dementia and high blood pressure but no known history of blood clots. They have a history of high blood pressure and are on medication for it. They recently developed an unspecified allergy that caused a severe rash.   Of note, patient admitted between 10/26 and 02/17/24 for abdominal pain. Found to have large hepatic cysts. IR drained the cysts - around 300 cc aspirated. She had some improvement in abdominal pain. Cytology sent and negative for malignant cells. It notes acellular  mucin.  Pertinent  Medical History   Past Medical History:  Diagnosis Date   Chronic diastolic heart failure (HCC) 02/12/2024   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 pylori infection    Hypertension    Iron deficiency anemia 08/05/2016   Pulmonary embolism (HCC) 02/29/2024   Objective    Blood pressure 138/64, pulse 77, temperature 98.8 F (37.1 C), temperature source Oral, resp. rate 18, height 5' 5 (1.651 m), weight 70.8 kg, SpO2 96%.        Intake/Output Summary (Last 24 hours) at 03/04/2024 1631 Last data filed at 03/04/2024 1100 Gross per 24 hour  Intake 578.99 ml  Output --  Net 578.99 ml   Filed Weights   02/29/24 1140  Weight: 70.8 kg    Examination: General: well appearing, no distress HENT: anicteric sclera, well injected conjunctivae, oral and nasal mucosa normal Lungs: decreased breath sounds in right lower lung field. POCUS evaluation shows a small anechoic pleural effusion.  Cardiovascular: RRR, no murmurs Abdomen: soft, non-tender, liver ultrasound shows a few large anechoic cysts as well. Extremities: no edema Neuro: alert, oriented x 3 GU: deferred.  Resolved problem list   Assessment and Plan   #R pleural effusion:  #Unprovoked Low Risk PE: Echocardiogram without evidence of RV strain. > I evaluated the patient's R pleural effusion with ultrasound. The effusion is very small and not amenabel to drainage especially given that patient is on anticoagulation for acute PE. The risks of thoracentesis would outweigh the benefits. PE is a recognized cause of exudative pleural  effusions. The incidence of pleural effusion in patients with PE is significant, with studies showing it can be present in up to 41.2% of cases. Pleural effusion in the context of PE is often unilateral and small as noted in this patient clinical picture.  Recommendations: - Given that PE is unprovoked, recommend long term anticoagulation with DOAC - The patient will need  pulmonology follow up as an outpatient for PE and follow up of pleural effusion  Pulmonology team will sign off at this time. Labs   CBC: Recent Labs  Lab 02/29/24 0956 02/29/24 1006 03/01/24 0607 03/02/24 0603 03/03/24 1121 03/04/24 1225  WBC 12.2*  --  11.4* 10.8* 8.1 6.1  NEUTROABS 9.4*  --   --   --   --   --   HGB 11.8* 12.2 11.1* 10.2* 10.0* 10.2*  HCT 36.4 36.0 34.7* 30.9* 31.1* 34.0*  MCV 86.9  --  87.6 86.8 86.1 94.4  PLT 228  --  206 199 220 239    Basic Metabolic Panel: Recent Labs  Lab 02/29/24 0956 02/29/24 1006 03/01/24 0809 03/02/24 0603 03/03/24 1121 03/04/24 1225  NA 138 138 136 133* 137 138  K 3.6 3.6 3.5 3.7 4.4 4.8  CL 100 99 98 97* 100 99  CO2 29  --  26 28 29 29   GLUCOSE 105* 106* 101* 107* 122* 118*  BUN 14 14 21  27* 16 14  CREATININE 0.79 0.90 1.02* 0.95 0.80 0.80  CALCIUM 9.7  --  9.2 8.7* 9.1 9.0   GFR: Estimated Creatinine Clearance: 51.6 mL/min (by C-G formula based on SCr of 0.8 mg/dL). Recent Labs  Lab 03/01/24 0607 03/02/24 0603 03/03/24 1121 03/04/24 1225  WBC 11.4* 10.8* 8.1 6.1    Liver Function Tests: Recent Labs  Lab 02/29/24 0956 03/01/24 0809  AST 27 32  ALT 20 16  ALKPHOS 85 85  BILITOT 0.8 0.6  PROT 7.8 7.7  ALBUMIN 3.8 3.4*   Recent Labs  Lab 02/29/24 0956  LIPASE 31   No results for input(s): AMMONIA in the last 168 hours.  ABG    Component Value Date/Time   TCO2 30 02/29/2024 1006     Coagulation Profile: No results for input(s): INR, PROTIME in the last 168 hours.  Cardiac Enzymes: No results for input(s): CKTOTAL, CKMB, CKMBINDEX, TROPONINI in the last 168 hours.  HbA1C: No results found for: HGBA1C  CBG: No results for input(s): GLUCAP in the last 168 hours.  Review of Systems:   Not obtained  Past Medical History:  She,  has a past medical history of Chronic diastolic heart failure (HCC) (89/73/7974), Cystic disease of liver (02/12/2024), H. pylori infection,  Hypertension, Iron deficiency anemia (08/05/2016), and Pulmonary embolism (HCC) (02/29/2024).   Surgical History:   Past Surgical History:  Procedure Laterality Date   CATARACT EXTRACTION, BILATERAL     ESOPHAGOGASTRODUODENOSCOPY     EYE SURGERY     on her retina   liver cyst aspiration  01/2024     Social History:   reports that she has never smoked. She has never used smokeless tobacco. She reports that she does not drink alcohol and does not use drugs.   Family History:  Her family history includes Hyperlipidemia in her mother; Hypertension in her father and mother. There is no history of Colon cancer, Esophageal cancer, Rectal cancer, or Stomach cancer.   Allergies Allergies  Allergen Reactions   Hydralazine      Report numbness and tingling hands and feet. Anxiety.  Home Medications  Prior to Admission medications   Medication Sig Start Date End Date Taking? Authorizing Provider  losartan -hydrochlorothiazide (HYZAAR) 100-25 MG tablet TAKE 1 TABLET BY MOUTH DAILY 02/10/22  Yes Thedora Garnette HERO, MD  meclizine  (ANTIVERT ) 12.5 MG tablet Take 1 tablet (12.5 mg total) by mouth 3 (three) times daily as needed for dizziness. 09/18/20  Yes Cirigliano, Mary K, DO  nystatin (MYCOSTATIN) 100000 UNIT/ML suspension Take 5 mLs (500,000 Units total) by mouth 4 (four) times daily. Patient not taking: Reported on 02/29/2024 02/16/24   Regalado, Belkys A, MD     Thank you for this interesting consult. I have spent 60 minutes evaluating patient, reviewing chart, and discussing plan of care with patient, family, and primary medical team. If you have any questions or concerns please reach out to me via secure chat.  Paula Southerly, MD [facility]

## 2024-03-04 NOTE — Progress Notes (Signed)
 PROGRESS NOTE    Karen [name]  FMW:[id number] DOB: 1941-04-15 DOA: 02/29/2024 PCP: Pcp, No   Brief Narrative: 83 year old with past medical history significant for herpes zoster, H. pylori, hypertension, GERD, iron deficiency anemia, hyperlipidemia, grade 1 diastolic dysfunction, hypertensive heart disease, hepatic cystic disease who was admitted 02/12/2024 until 10/31st/2025 and status post drainage of large liver cyst by IR presents to the ED complaining of right lower back and right upper quadrant abdominal pain initially thought similar to her previous liver cyst.  Subsequent evaluation in the ED CT abdomen and pelvis with contrast show evidence of patient's known numerous liver cysts with interval decrease in size of the previously seen largest cyst in the left lobe post aspiration.  New bilateral proximal lower lobe pulmonary emboli.  New small right pleural effusion with associated hazy right basilar opacity likely atelectasis or less likely infection.   Assessment & Plan:   Principal Problem:   Pulmonary embolism (HCC) Active Problems:   Iron deficiency anemia   Gastroesophageal reflux disease without esophagitis   Essential hypertension   Chronic diastolic heart failure (HCC)   Cystic disease of liver   Acute deep vein thrombosis (DVT) of right peroneal vein (HCC)   Mixed hyperlipidemia  1-Acute BL Pulmonary embolism  Acute deep vein thrombosis right peroneal vein - Echo: Normal EF, normal RV function.  - Doppler: Right consistent with acute deep vein thrombosis involving the right peroneal veins. -Transition to Eliquis.  -pain improving, received IV dilaudid overnight. Plan to monitor on oral tramadol today.  -chest x ray negative.  -PE was diagnosed by CT abdomen pelvis. She will need to be on anticoagulation for DVT as well, with mild increased cr would like to avoid further contrast.  -Continue with  Lydocaine patch, schedule tylenol . Incentive spirometry.  -Chest pain  is pleuritic making PE most likely diagnosis. Could have some component of liver cyst -She has been off oxygen, she does report more chest pain right upper part, report more cough/ , I proceed with CTA chest to evaluate size of PE and to ruled out PNA. Which was positive for pneumonia.   PNA:  Respiratory , Bronchitis.  Develops Upper resp wheezing.  Continue with Guaifenesin.  CTA showed small to moderate pleural effusion and right lower lobe infiltrates.  Started IV ceftriaxone and Azithromycin.  Continue with Breathing treatment.  Pulmonologist consulted.   Iron deficiency anemia Iron and Tsat low.  Continue with iron supplement.  Report dark stool, black: plan to check sample, she is also on Iron supplement.   GERD;Continue with PPI  Hypertension: Holding  Cozaar  and hydrochlorothiazide, SBP 120  Cystic disease of liver: She was referred to hepatologist  Mixed hyperlipidemia  AKI; mild increased cr to 1.0. baseline 0.7 Improved with fluids.   Estimated body mass index is 25.96 kg/m as calculated from the following:   Height as of this encounter: 5' 5 (1.651 m).   Weight as of this encounter: 70.8 kg.   DVT prophylaxis: heparin  Code Status: DNR Family Communication: Discussed with son over phone  Disposition Plan:  Status is: Observation The patient will require care spanning > 2 midnights and should be moved to inpatient because: management of chest pain, PE<>     Consultants:  none  Procedures:  ECHO Doppler  Antimicrobials:   Subjective: She report pain is controlled, only bothers her when she cough or move around.  Denies dyspnea. Still coughing phlegm.    Objective: Vitals:   03/04/24 0531 03/04/24 0740 03/04/24  1423 03/04/24 1437  BP: 133/70  138/64   Pulse: 64  77   Resp: 18  18   Temp: 97.7 F (36.5 C)  98.8 F (37.1 C)   TempSrc: Oral  Oral   SpO2: 95% 95% 96% 96%  Weight:      Height:        Intake/Output Summary (Last 24 hours)  at 03/04/2024 1534 Last data filed at 03/04/2024 1100 Gross per 24 hour  Intake 578.99 ml  Output --  Net 578.99 ml   Filed Weights   02/29/24 1140  Weight: 70.8 kg    Examination:  General exam: NAD Respiratory system: BL Ronchus.  Cardiovascular system: S 1, S 2 RRR Gastrointestinal system: BS present, soft, nt Central nervous system: alert, conversant  Data Reviewed: I have personally reviewed following labs and imaging studies  CBC: Recent Labs  Lab 02/29/24 0956 02/29/24 1006 03/01/24 0607 03/02/24 0603 03/03/24 1121 03/04/24 1225  WBC 12.2*  --  11.4* 10.8* 8.1 6.1  NEUTROABS 9.4*  --   --   --   --   --   HGB 11.8* 12.2 11.1* 10.2* 10.0* 10.2*  HCT 36.4 36.0 34.7* 30.9* 31.1* 34.0*  MCV 86.9  --  87.6 86.8 86.1 94.4  PLT 228  --  206 199 220 239   Basic Metabolic Panel: Recent Labs  Lab 02/29/24 0956 02/29/24 1006 03/01/24 0809 03/02/24 0603 03/03/24 1121 03/04/24 1225  NA 138 138 136 133* 137 138  K 3.6 3.6 3.5 3.7 4.4 4.8  CL 100 99 98 97* 100 99  CO2 29  --  26 28 29 29   GLUCOSE 105* 106* 101* 107* 122* 118*  BUN 14 14 21  27* 16 14  CREATININE 0.79 0.90 1.02* 0.95 0.80 0.80  CALCIUM 9.7  --  9.2 8.7* 9.1 9.0   GFR: Estimated Creatinine Clearance: 51.6 mL/min (by C-G formula based on SCr of 0.8 mg/dL). Liver Function Tests: Recent Labs  Lab 02/29/24 0956 03/01/24 0809  AST 27 32  ALT 20 16  ALKPHOS 85 85  BILITOT 0.8 0.6  PROT 7.8 7.7  ALBUMIN 3.8 3.4*   Recent Labs  Lab 02/29/24 0956  LIPASE 31   No results for input(s): AMMONIA in the last 168 hours. Coagulation Profile: No results for input(s): INR, PROTIME in the last 168 hours. Cardiac Enzymes: No results for input(s): CKTOTAL, CKMB, CKMBINDEX, TROPONINI in the last 168 hours. BNP (last 3 results) No results for input(s): PROBNP in the last 8760 hours. HbA1C: No results for input(s): HGBA1C in the last 72 hours. CBG: No results for input(s): GLUCAP  in the last 168 hours. Lipid Profile: No results for input(s): CHOL, HDL, LDLCALC, TRIG, CHOLHDL, LDLDIRECT in the last 72 hours. Thyroid Function Tests: No results for input(s): TSH, T4TOTAL, FREET4, T3FREE, THYROIDAB in the last 72 hours. Anemia Panel: No results for input(s): VITAMINB12, FOLATE, FERRITIN, TIBC, IRON, RETICCTPCT in the last 72 hours. Sepsis Labs: No results for input(s): PROCALCITON, LATICACIDVEN in the last 168 hours.  No results found for this or any previous visit (from the past 240 hours).       Radiology Studies: CT Angio Chest Pulmonary Embolism (PE) W or WO Contrast Result Date: 03/03/2024 CLINICAL DATA:  Pulmonary embolism suspected, high probability. EXAM: CT ANGIOGRAPHY CHEST WITH CONTRAST TECHNIQUE: Multidetector CT imaging of the chest was performed using the standard protocol during bolus administration of intravenous contrast. Multiplanar CT image reconstructions and MIPs were obtained to evaluate the  vascular anatomy. RADIATION DOSE REDUCTION: This exam was performed according to the departmental dose-optimization program which includes automated exposure control, adjustment of the mA and/or kV according to patient size and/or use of iterative reconstruction technique. CONTRAST:  75mL OMNIPAQUE  IOHEXOL  350 MG/ML SOLN COMPARISON:  12/15/2023, 02/29/2024. FINDINGS: Cardiovascular: The heart is enlarged and there is no pericardial effusion. There is atherosclerotic calcification of the aorta a without evidence of aneurysm. The pulmonary trunk is normal in caliber. Small segmental and subsegmental pulmonary emboli are present in the right lower lobe and in the lobar, segmental, and subsegmental pulmonary arteries on the left. No evidence of right heart strain. Mediastinum/Nodes: No mediastinal, hilar, or axillary lymphadenopathy is seen. The thyroid gland, trachea, and esophagus are within normal limits. There is a small hiatal  hernia. Lungs/Pleura: There is a small to moderate pleural effusion on the right. Compressive atelectasis versus infiltrate is noted in the right lower lobe. No pneumothorax bilaterally. Upper Abdomen: Multiple large hepatic cysts are present. No acute abnormality. Musculoskeletal: Degenerative changes are present in the thoracic spine. No acute osseous abnormality. Review of the MIP images confirms the above findings. IMPRESSION: 1. Small bilateral pulmonary emboli, improved from the prior exam. 2. Small to moderate right pleural effusion with increased atelectasis or infiltrate. 3. Small hiatal hernia. 4. Aortic atherosclerosis. Electronically Signed   By: Leita Birmingham M.D.   On: 03/03/2024 16:14         Scheduled Meds:  acetaminophen   500 mg Oral TID   apixaban  10 mg Oral BID   Followed by   NOREEN ON 03/08/2024] apixaban  5 mg Oral BID   feeding supplement  237 mL Oral BID BM   [START ON 03/05/2024] guaiFENesin  1,200 mg Oral BID   Influenza vac split trivalent PF  0.5 mL Intramuscular Tomorrow-1000   ipratropium-albuterol  3 mL Nebulization TID   lidocaine  1 patch Transdermal Daily   pantoprazole   40 mg Oral Daily   polyethylene glycol  17 g Oral BID   senna-docusate  1 tablet Oral BID   Continuous Infusions:  azithromycin Stopped (03/04/24 0013)   cefTRIAXone (ROCEPHIN)  IV Stopped (03/03/24 2342)      LOS: 0 days    Time spent: 35 Minutes    Jaeleigh Monaco A Moataz Tavis, MD [facility]   If 7PM-7AM, please contact night-coverage [email or web address]  03/04/2024, 3:34 PM

## 2024-03-04 NOTE — Plan of Care (Signed)

## 2024-03-05 DIAGNOSIS — I2699 Other pulmonary embolism without acute cor pulmonale: Secondary | ICD-10-CM | POA: Diagnosis not present

## 2024-03-05 LAB — CBC
HCT: 33.3 % — ABNORMAL LOW (ref 36.0–46.0)
Hemoglobin: 10.7 g/dL — ABNORMAL LOW (ref 12.0–15.0)
MCH: 27.9 pg (ref 26.0–34.0)
MCHC: 32.1 g/dL (ref 30.0–36.0)
MCV: 86.7 fL (ref 80.0–100.0)
Platelets: 270 K/uL (ref 150–400)
RBC: 3.84 MIL/uL — ABNORMAL LOW (ref 3.87–5.11)
RDW: 14.5 % (ref 11.5–15.5)
WBC: 5.7 K/uL (ref 4.0–10.5)
nRBC: 0 % (ref 0.0–0.2)

## 2024-03-05 LAB — OCCULT BLOOD X 1 CARD TO LAB, STOOL
Fecal Occult Bld: NEGATIVE
Fecal Occult Bld: POSITIVE — AB

## 2024-03-05 MED ORDER — LACTATED RINGERS IV BOLUS: 500.0000 mL | Freq: Once | INTRAVENOUS | Status: DC

## 2024-03-05 MED ORDER — LIDOCAINE 5 % EX PTCH: 1.0000 | MEDICATED_PATCH | Freq: Every day | CUTANEOUS | 0 refills | Status: DC

## 2024-03-05 MED ORDER — LOSARTAN POTASSIUM-HCTZ 100-25 MG PO TABS: 1.0000 | ORAL_TABLET | Freq: Every day | ORAL | Status: DC

## 2024-03-05 MED ORDER — PANTOPRAZOLE SODIUM 40 MG PO TBEC
40.0000 mg | DELAYED_RELEASE_TABLET | Freq: Two times a day (BID) | ORAL | Status: DC
  Administered 2024-03-05: 40 mg via ORAL
  Filled 2024-03-05: qty 1

## 2024-03-05 MED ORDER — PANTOPRAZOLE SODIUM 40 MG PO TBEC: 40.0000 mg | DELAYED_RELEASE_TABLET | Freq: Every day | ORAL | 0 refills | Status: DC

## 2024-03-05 MED ORDER — APIXABAN 5 MG PO TABS: ORAL_TABLET | ORAL | 1 refills | Status: DC

## 2024-03-05 MED ORDER — HYDROCHLOROTHIAZIDE 25 MG PO TABS
25.0000 mg | ORAL_TABLET | Freq: Every day | ORAL | Status: DC
  Administered 2024-03-05: 25 mg via ORAL
  Filled 2024-03-05: qty 1

## 2024-03-05 MED ORDER — ALBUTEROL SULFATE HFA 108 (90 BASE) MCG/ACT IN AERS: 2.0000 | INHALATION_SPRAY | Freq: Four times a day (QID) | RESPIRATORY_TRACT | 2 refills | Status: DC | PRN

## 2024-03-05 MED ORDER — SODIUM CHLORIDE 0.9 % IV SOLN
500.0000 mg | INTRAVENOUS | Status: DC
  Administered 2024-03-05: 500 mg via INTRAVENOUS
  Filled 2024-03-05: qty 5

## 2024-03-05 MED ORDER — AMOXICILLIN-POT CLAVULANATE 875-125 MG PO TABS: 1.0000 | ORAL_TABLET | Freq: Two times a day (BID) | ORAL | 0 refills | Status: DC

## 2024-03-05 MED ORDER — ACETAMINOPHEN 500 MG PO TABS: 500.0000 mg | ORAL_TABLET | Freq: Three times a day (TID) | ORAL | 0 refills | Status: DC

## 2024-03-05 MED ORDER — POLYETHYLENE GLYCOL 3350 17 G PO PACK: 17.0000 g | PACK | Freq: Two times a day (BID) | ORAL | 0 refills | Status: DC

## 2024-03-05 MED ORDER — LOSARTAN POTASSIUM 50 MG PO TABS
100.0000 mg | ORAL_TABLET | Freq: Every day | ORAL | Status: DC
  Administered 2024-03-05 – 2024-03-07 (×3): 100 mg via ORAL
  Filled 2024-03-05 (×3): qty 2

## 2024-03-05 MED ORDER — SODIUM CHLORIDE 0.9 % IV SOLN
2.0000 g | INTRAVENOUS | Status: DC
  Administered 2024-03-05 – 2024-03-06 (×2): 2 g via INTRAVENOUS
  Filled 2024-03-05 (×2): qty 20

## 2024-03-05 MED ORDER — TRAMADOL HCL 50 MG PO TABS: 50.0000 mg | ORAL_TABLET | Freq: Four times a day (QID) | ORAL | 0 refills | Status: DC | PRN

## 2024-03-05 MED ORDER — LACTATED RINGERS IV BOLUS: 250.0000 mL | Freq: Once | INTRAVENOUS | Status: DC

## 2024-03-05 MED ORDER — LACTATED RINGERS IV BOLUS
250.0000 mL | Freq: Once | INTRAVENOUS | Status: AC
  Administered 2024-03-05: 250 mL via INTRAVENOUS

## 2024-03-05 MED ORDER — GUAIFENESIN ER 600 MG PO TB12: 1200.0000 mg | ORAL_TABLET | Freq: Two times a day (BID) | ORAL | 0 refills | Status: DC

## 2024-03-05 NOTE — Progress Notes (Signed)
 PROGRESS NOTE    Rotunda Worden  FMW:[id number] DOB: [birth year] DOA: 02/29/2024 PCP: Pcp, No   Brief Narrative: 83 year old with past medical history significant for herpes zoster, H. pylori, hypertension, GERD, iron deficiency anemia, hyperlipidemia, grade 1 diastolic dysfunction, hypertensive heart disease, hepatic cystic disease who was admitted 02/12/2024 until 10/31st/2025 and status post drainage of large liver cyst by IR presents to the ED complaining of right lower back and right upper quadrant abdominal pain initially thought similar to her previous liver cyst.  Subsequent evaluation in the ED CT abdomen and pelvis with contrast show evidence of patient's known numerous liver cysts with interval decrease in size of the previously seen largest cyst in the left lobe post aspiration.  New bilateral proximal lower lobe pulmonary emboli.  New small right pleural effusion with associated hazy right basilar opacity likely atelectasis or less likely infection.   Assessment & Plan:   Principal Problem:   Pulmonary embolism (HCC) Active Problems:   Iron deficiency anemia   Gastroesophageal reflux disease without esophagitis   Essential hypertension   Chronic diastolic heart failure (HCC)   Cystic disease of liver   Acute deep vein thrombosis (DVT) of right peroneal vein (HCC)   Mixed hyperlipidemia  1-Acute BL Pulmonary embolism  Acute deep vein thrombosis right peroneal vein - Echo: Normal EF, normal RV function.  - Doppler: Right consistent with acute deep vein thrombosis involving the right peroneal veins. -Transition to Eliquis.  -pain improving, received IV dilaudid overnight. Plan to monitor on oral tramadol today.  -chest x ray negative.  -PE was diagnosed by CT abdomen pelvis. She will need to be on anticoagulation for DVT as well, with mild increased cr would like to avoid further contrast.  -Continue with  Lydocaine patch, schedule tylenol . Incentive spirometry.  -Chest pain  is pleuritic making PE most likely diagnosis. Could have some component of liver cyst -She has been off oxygen, she does report more chest pain right upper part, report more cough/ , I proceed with CTA chest to evaluate size of PE and to ruled out PNA. Which was positive for pneumonia.   PNA:  Respiratory , Bronchitis.  Develops Upper resp wheezing.  Continue with Guaifenesin.  CTA showed small to moderate pleural effusion and right lower lobe infiltrates.  Continue with  IV ceftriaxone and Azithromycin.  Continue with Breathing treatment.  Pulmonologist consulted. Plan to follow up Pleural effusion out patient.  Discharge on Augmentin.   Black Stool: Had another episode fo black stool. Plan to hold discharge.  Change protonix  to BID>  Plan to repeat Hb tomorrow and check occult blood times 2.  I have discontinue Iron supplement.   Iron deficiency anemia Iron and Tsat low.  Hold  iron supplement.    GERD;Continue with PPI  Hypertension: Resume Cozaar  and hydrochlorothiazide, SBP increasing.   Cystic disease of liver: She was referred to hepatologist  Mixed hyperlipidemia  AKI; mild increased cr to 1.0. baseline 0.7 Improved with fluids.   Estimated body mass index is 25.96 kg/m as calculated from the following:   Height as of this encounter: 5' 5 (1.651 m).   Weight as of this encounter: 70.8 kg.   DVT prophylaxis: heparin  Code Status: DNR Family Communication: Discussed with daughter over phone.  Disposition Plan:  Status is: Observation The patient will require care spanning > 2 midnights and should be moved to inpatient because: management of chest pain, PE<>     Consultants:  none  Procedures:  ECHO Doppler  Antimicrobials:   Subjective: She is breathing better, chest pain improved.  She report black stool again this am.    Objective: Vitals:   03/04/24 2104 03/05/24 0521 03/05/24 0735 03/05/24 1356  BP:  (!) 161/81  (!) 166/68  Pulse:  78   85  Resp:  20  18  Temp:  98.5 F (36.9 C)  98.6 F (37 C)  TempSrc:      SpO2: 97% 93% 96% 95%  Weight:      Height:        Intake/Output Summary (Last 24 hours) at 03/05/2024 1616 Last data filed at 03/05/2024 1300 Gross per 24 hour  Intake 1200.86 ml  Output --  Net 1200.86 ml   Filed Weights   02/29/24 1140  Weight: 70.8 kg    Examination:  General exam: NAD Respiratory system: BL ronchus.   Cardiovascular system: S 1, S 2 RRR Gastrointestinal system: BS present, soft, nt Central nervous system: alert, conversant  Data Reviewed: I have personally reviewed following labs and imaging studies  CBC: Recent Labs  Lab 02/29/24 0956 02/29/24 1006 03/01/24 0607 03/02/24 0603 03/03/24 1121 03/04/24 1225 03/05/24 0940  WBC 12.2*  --  11.4* 10.8* 8.1 6.1 5.7  NEUTROABS 9.4*  --   --   --   --   --   --   HGB 11.8*   < > 11.1* 10.2* 10.0* 10.2* 10.7*  HCT 36.4   < > 34.7* 30.9* 31.1* 34.0* 33.3*  MCV 86.9  --  87.6 86.8 86.1 94.4 86.7  PLT 228  --  206 199 220 239 270   < > = values in this interval not displayed.   Basic Metabolic Panel: Recent Labs  Lab 02/29/24 0956 02/29/24 1006 03/01/24 0809 03/02/24 0603 03/03/24 1121 03/04/24 1225  NA 138 138 136 133* 137 138  K 3.6 3.6 3.5 3.7 4.4 4.8  CL 100 99 98 97* 100 99  CO2 29  --  26 28 29 29   GLUCOSE 105* 106* 101* 107* 122* 118*  BUN 14 14 21  27* 16 14  CREATININE 0.79 0.90 1.02* 0.95 0.80 0.80  CALCIUM 9.7  --  9.2 8.7* 9.1 9.0   GFR: Estimated Creatinine Clearance: 51.6 mL/min (by C-G formula based on SCr of 0.8 mg/dL). Liver Function Tests: Recent Labs  Lab 02/29/24 0956 03/01/24 0809  AST 27 32  ALT 20 16  ALKPHOS 85 85  BILITOT 0.8 0.6  PROT 7.8 7.7  ALBUMIN 3.8 3.4*   Recent Labs  Lab 02/29/24 0956  LIPASE 31   No results for input(s): AMMONIA in the last 168 hours. Coagulation Profile: No results for input(s): INR, PROTIME in the last 168 hours. Cardiac Enzymes: No  results for input(s): CKTOTAL, CKMB, CKMBINDEX, TROPONINI in the last 168 hours. BNP (last 3 results) No results for input(s): PROBNP in the last 8760 hours. HbA1C: No results for input(s): HGBA1C in the last 72 hours. CBG: No results for input(s): GLUCAP in the last 168 hours. Lipid Profile: No results for input(s): CHOL, HDL, LDLCALC, TRIG, CHOLHDL, LDLDIRECT in the last 72 hours. Thyroid Function Tests: No results for input(s): TSH, T4TOTAL, FREET4, T3FREE, THYROIDAB in the last 72 hours. Anemia Panel: No results for input(s): VITAMINB12, FOLATE, FERRITIN, TIBC, IRON, RETICCTPCT in the last 72 hours. Sepsis Labs: No results for input(s): PROCALCITON, LATICACIDVEN in the last 168 hours.  No results found for this or any previous visit (from the past 240 hours).  Radiology Studies: No results found.        Scheduled Meds:  acetaminophen   500 mg Oral TID   apixaban  10 mg Oral BID   Followed by   NOREEN ON 03/08/2024] apixaban  5 mg Oral BID   feeding supplement  237 mL Oral BID BM   guaiFENesin  1,200 mg Oral BID   losartan   100 mg Oral Daily   And   hydrochlorothiazide  25 mg Oral Daily   ipratropium-albuterol  3 mL Nebulization TID   lidocaine  1 patch Transdermal Daily   pantoprazole   40 mg Oral BID   Continuous Infusions:  azithromycin     cefTRIAXone (ROCEPHIN)  IV        LOS: 1 day    Time spent: 35 Minutes    Tamorah Hada A Lavita Pontius, MD [facility]   If 7PM-7AM, please contact night-coverage [email or web address]  03/05/2024, 4:16 PM

## 2024-03-05 NOTE — Plan of Care (Signed)

## 2024-03-05 NOTE — Plan of Care (Signed)
 Pt requested to have labs drawn at later time. Phlebotomy states they will return at 0800.   Problem: Education: Goal: Knowledge of General Education information will improve Description: Including pain rating scale, medication(s)/side effects and non-pharmacologic comfort measures Outcome: Progressing   Problem: Health Behavior/Discharge Planning: Goal: Ability to manage health-related needs will improve Outcome: Progressing   Problem: Clinical Measurements: Goal: Ability to maintain clinical measurements within normal limits will improve Outcome: Progressing   Problem: Activity: Goal: Risk for activity intolerance will decrease Outcome: Progressing   Problem: Nutrition: Goal: Adequate nutrition will be maintained Outcome: Progressing   Problem: Pain Managment: Goal: General experience of comfort will improve and/or be controlled Outcome: Progressing   Problem: Safety: Goal: Ability to remain free from injury will improve Outcome: Progressing   Problem: Skin Integrity: Goal: Risk for impaired skin integrity will decrease Outcome: Progressing

## 2024-03-05 NOTE — TOC Transition Note (Signed)
 Transition of Care [facility]) - Discharge Note  Patient Details  Name: Karen [name] MRN: [id number] Date of Birth: April 29, 1940  Transition of [facility]) CM/SW Contact:  Duwaine GORMAN Aran, LCSW Phone Number: 03/05/2024, 1:35 PM  Clinical Narrative: PT recommended rollator. Patient and son are agreeable to having rollator set up. CSW made DME referral to Zack with [employer]. [employer] to deliver rollator to patient's room. Care management signing off.  Final next level of care: Home/Self Care Barriers to Discharge: Barriers Resolved  Patient Goals and CMS Choice CMS Medicare.gov Compare Post Acute Care list provided to:: Patient Choice offered to / list presented to : Patient, Adult Children  Discharge Plan and Services Additional resources added to the After Visit Summary for          DME Arranged: Walker rolling with seat DME Agency: [employer] Date DME Agency Contacted: 03/05/24 Time DME Agency Contacted: 1324 Representative spoke with at DME Agency: Zack  Social Drivers of Health (SDOH) Interventions SDOH Screenings   Food Insecurity: No Food Insecurity (02/29/2024)  Housing: Low Risk  (02/29/2024)  Transportation Needs: No Transportation Needs (02/29/2024)  Utilities: Not At Risk (02/29/2024)  Alcohol Screen: Low Risk  (11/03/2021)  Depression (PHQ2-9): Low Risk  (11/03/2021)  Financial Resource Strain: Low Risk  (11/03/2021)  Physical Activity: Sufficiently Active (11/03/2021)  Social Connections: Moderately Integrated (02/29/2024)  Stress: No Stress Concern Present (11/03/2021)  Tobacco Use: Low Risk  (03/03/2024)   Readmission Risk Interventions     No data to display

## 2024-03-05 NOTE — Progress Notes (Signed)
 Occupational Therapy Treatment Patient Details Name: Karen [name] MRN: [id number] DOB: 11-Apr-1941 Today's Date: 03/05/2024   History of present illness Pt is 83 yo female admitted on 02/29/24 with acute PE and R LE DVT.  Pt with hx including but not limited to herpes zoster, H. pylori, hypertension, GERD, iron deficiency anemia, hyperlipidemia, grade 1 diastolic dysfunction, hypertensive heart disease, hepatic cystic disease with recent admission for aspiration of cyst   OT comments  Pt completed ADLs and ambulation with set up to supervision. Reports fatiguing easily. Recommended seated showering and educated in energy conservation strategies.       If plan is discharge home, recommend the following:  A little help with walking and/or transfers;A little help with bathing/dressing/bathroom   Equipment Recommendations  Tub/shower bench    Recommendations for Other Services      Precautions / Restrictions Precautions Precautions: Fall Restrictions Weight Bearing Restrictions Per Provider Order: No       Mobility Bed Mobility Overal bed mobility: Modified Independent             General bed mobility comments: HOB up    Transfers Overall transfer level: Needs assistance Equipment used: None Transfers: Sit to/from Stand Sit to Stand: Supervision                 Balance Overall balance assessment: Needs assistance Sitting-balance support: No upper extremity supported Sitting balance-Leahy Scale: Good       Standing balance-Leahy Scale: Fair                             ADL either performed or assessed with clinical judgement   ADL Overall ADL's : Needs assistance/impaired     Grooming: Wash/dry hands;Standing;Supervision/safety           Upper Body Dressing : Set up;Sitting   Lower Body Dressing: Set up;Sitting/lateral leans   Toilet Transfer: Supervision/safety;Ambulation   Toileting- Clothing Manipulation and Hygiene:  Supervision/safety       Functional mobility during ADLs: Supervision/safety      Extremity/Trunk Assessment              Vision       Perception     Praxis     Communication Communication Communication: No apparent difficulties   Cognition Arousal: Alert Behavior During Therapy: WFL for tasks assessed/performed Cognition: No apparent impairments                               Following commands: Intact        Cueing   Cueing Techniques: Verbal cues  Exercises      Shoulder Instructions       General Comments      Pertinent Vitals/ Pain       Pain Assessment Pain Assessment: Faces Faces Pain Scale: Hurts a little bit Pain Location: back Pain Descriptors / Indicators: Discomfort Pain Intervention(s): Heat applied, Monitored during session  Home Living                                          Prior Functioning/Environment              Frequency  Min 2X/week        Progress Toward Goals  OT Goals(current goals can now be found in the care  plan section)  Progress towards OT goals: Progressing toward goals  Acute Rehab OT Goals OT Goal Formulation: With patient Time For Goal Achievement: 03/17/24 Potential to Achieve Goals: Good  Plan      Co-evaluation                 AM-PAC OT 6 Clicks Daily Activity     Outcome Measure   Help from another person eating meals?: None Help from another person taking care of personal grooming?: A Little Help from another person toileting, which includes using toliet, bedpan, or urinal?: A Little Help from another person bathing (including washing, rinsing, drying)?: A Little Help from another person to put on and taking off regular upper body clothing?: A Little Help from another person to put on and taking off regular lower body clothing?: A Little 6 Click Score: 19    End of Session    OT Visit Diagnosis: Unsteadiness on feet (R26.81)   Activity  Tolerance Patient tolerated treatment well   Patient Left in chair;with call bell/phone within reach   Nurse Communication          Time: 9156-9096 OT Time Calculation (min): 20 min  Charges: OT General Charges $OT Visit: 1 Visit OT Treatments $Self Care/Home Management : 8-22 mins  Mliss HERO, OTR/L Acute Rehabilitation Services Office: [phone]   Kennth Mliss Helling 03/05/2024, 9:07 AM

## 2024-03-05 NOTE — Progress Notes (Signed)
 Physical Therapy Treatment Patient Details Name: Karen [name] MRN: [id number] DOB: 1941/03/27 Today's Date: 03/05/2024   History of Present Illness Pt is 83 yo female admitted on 02/29/24 with acute PE and R LE DVT.  Pt with hx including but not limited to herpes zoster, H. pylori, hypertension, GERD, iron deficiency anemia, hyperlipidemia, grade 1 diastolic dysfunction, hypertensive heart disease, hepatic cystic disease with recent admission for aspiration of cyst    PT Comments  PT - Cognition Comments: AxO x 3 pleasant and willing.  Very tired long day, stated Pt.  Was hoping to go home today but stated her bowels are dark.  Rollator was delivered to room so assisted with amb as well as proper use and how to fold.  General Gait Details: assisted with amb using her new Rollator walker a functional distance.  Pt aware, use is for Community/not in home.  Educated on how to johnson controls. Educated to always back walker against a wall or sturdy object before sitting.  Also Educated on when to lock brakes before sitting.  Pt tolerated amb 120 feet with 2 seated rest breaks, properly performing seated rest brakes on Rollator seat.  Pt plans to D/C to home with Family support.  LPT has rec HH PT.    If plan is discharge home, recommend the following: A little help with walking and/or transfers;A little help with bathing/dressing/bathroom;Assistance with cooking/housework;Help with stairs or ramp for entrance   Can travel by private vehicle        Equipment Recommendations  Rollator (4 wheels)    Recommendations for Other Services       Precautions / Restrictions       Mobility  Bed Mobility Overal bed mobility: Modified Independent             General bed mobility comments: self able    Transfers Overall transfer level: Modified independent                 General transfer comment: self able with good safety awareness     Ambulation/Gait Ambulation/Gait assistance: Supervision Gait Distance (Feet): 120 Feet Assistive device: Rollator (4 wheels) Gait Pattern/deviations: Step-through pattern Gait velocity: decreased     General Gait Details: assisted with amb using her new Rollator walker a functional distance.  Pt aware, use is for Community/not in home.  Educated on how to johnson controls. Educated to always back walker against a wall or sturdy object before sitting.  Also Educated on when to lock brakes before sitting.  Pt tolerated amb 120 feet with 2 seated rest breaks, properly performing seated rest brakes on Rollator seat.   Stairs             Wheelchair Mobility     Tilt Bed    Modified Rankin (Stroke Patients Only)       Balance                                            Communication Communication Communication: No apparent difficulties  Cognition Arousal: Alert Behavior During Therapy: WFL for tasks assessed/performed   PT - Cognitive impairments: No apparent impairments                       PT - Cognition Comments: AxO x 3 pleasant and willing.  Very tired long day, stated Pt.  Was hoping to  go home today but stated her bowels are dark.  Rollator was delivered to room so assisted with amb as well as proper use and how to fold. Following commands: Intact      Cueing Cueing Techniques: Verbal cues  Exercises      General Comments        Pertinent Vitals/Pain Pain Assessment Pain Assessment: Faces Faces Pain Scale: Hurts a little bit Pain Location: back Pain Intervention(s): Monitored during session    Home Living                          Prior Function            PT Goals (current goals can now be found in the care plan section) Progress towards PT goals: Progressing toward goals    Frequency    Min 3X/week      PT Plan      Co-evaluation              AM-PAC PT 6 Clicks Mobility    Outcome Measure  Help needed turning from your back to your side while in a flat bed without using bedrails?: None Help needed moving from lying on your back to sitting on the side of a flat bed without using bedrails?: None Help needed moving to and from a bed to a chair (including a wheelchair)?: None Help needed standing up from a chair using your arms (e.g., wheelchair or bedside chair)?: None Help needed to walk in hospital room?: None Help needed climbing 3-5 steps with a railing? : A Little 6 Click Score: 23    End of Session Equipment Utilized During Treatment: Gait belt Activity Tolerance: Patient tolerated treatment well Patient left: in bed;with call bell/phone within reach;with bed alarm set Nurse Communication: Mobility status PT Visit Diagnosis: Other abnormalities of gait and mobility (R26.89);Muscle weakness (generalized) (M62.81)     Time: 8291-8278 PT Time Calculation (min) (ACUTE ONLY): 13 min  Charges:    $Gait Training: 8-22 mins PT General Charges $$ ACUTE PT VISIT: 1 Visit                     Katheryn Leap  PTA Acute  Rehabilitation Services Office M-F          [phone]

## 2024-03-06 DIAGNOSIS — R195 Other fecal abnormalities: Secondary | ICD-10-CM

## 2024-03-06 DIAGNOSIS — K921 Melena: Secondary | ICD-10-CM | POA: Insufficient documentation

## 2024-03-06 DIAGNOSIS — I82451 Acute embolism and thrombosis of right peroneal vein: Secondary | ICD-10-CM | POA: Diagnosis not present

## 2024-03-06 DIAGNOSIS — I2699 Other pulmonary embolism without acute cor pulmonale: Secondary | ICD-10-CM | POA: Diagnosis not present

## 2024-03-06 LAB — CBC
HCT: 36.2 % (ref 36.0–46.0)
Hemoglobin: 11.5 g/dL — ABNORMAL LOW (ref 12.0–15.0)
MCH: 28 pg (ref 26.0–34.0)
MCHC: 31.8 g/dL (ref 30.0–36.0)
MCV: 88.1 fL (ref 80.0–100.0)
Platelets: 288 K/uL (ref 150–400)
RBC: 4.11 MIL/uL (ref 3.87–5.11)
RDW: 14.5 % (ref 11.5–15.5)
WBC: 8.2 K/uL (ref 4.0–10.5)
nRBC: 0 % (ref 0.0–0.2)

## 2024-03-06 LAB — BASIC METABOLIC PANEL WITH GFR
Anion gap: 12 (ref 5–15)
BUN: 16 mg/dL (ref 8–23)
CO2: 23 mmol/L (ref 22–32)
Calcium: 9.5 mg/dL (ref 8.9–10.3)
Chloride: 103 mmol/L (ref 98–111)
Creatinine, Ser: 0.85 mg/dL (ref 0.44–1.00)
GFR, Estimated: >60 mL/min (ref >60)
Glucose, Bld: 99 mg/dL (ref 70–99)
Potassium: 3.9 mmol/L (ref 3.5–5.1)
Sodium: 138 mmol/L (ref 135–145)

## 2024-03-06 MED ORDER — HYDROCHLOROTHIAZIDE 25 MG PO TABS
25.0000 mg | ORAL_TABLET | Freq: Every day | ORAL | Status: DC
  Administered 2024-03-07: 25 mg via ORAL
  Filled 2024-03-06: qty 1

## 2024-03-06 MED ORDER — PANTOPRAZOLE SODIUM 40 MG IV SOLR
40.0000 mg | Freq: Two times a day (BID) | INTRAVENOUS | Status: DC
  Administered 2024-03-06 – 2024-03-07 (×3): 40 mg via INTRAVENOUS
  Filled 2024-03-06 (×3): qty 10

## 2024-03-06 NOTE — Consult Note (Addendum)
 Referring Provider: Dr. Owen Lore Primary Care Physician:  Pcp, No Primary Gastroenterologist:  Dr. Stacia  Reason for Consultation: Black stools on Eliquis  HPI: Karen [name] is a 83 y.o. female with a past medical history of hypertension, chronic diastolic CHF, IDA, GERD, H. pylori and liver cysts.    Recently admitted 10/26 - 02/17/2024 secondary to abdominal pain associated with large liver cysts, status post drainage of large liver cyst by IR.  Path report negative for malignant cells.  She presented to the ED 02/29/2024 with severe right sided abdominal/flank pain. Labs in the ED showed WBC count of 12.2.  Hemoglobin 11.8 up from 10.7 two weeks prior to admission.  Normal LFTs.  Lipase 31.  CTAP with contrast identified new bilateral pulmonary emboli, new small right pleural effusion, numerous liver cysts (decreased in size when compared to prior imaging), small hiatal hernia, moderate diverticulosis in the sigmoid colon without diverticulitis, small umbilical hernia and a right inguinal hernia. CTA 11/15 confirms small bilateral pulmonary emboli and small to moderate right pleural effusion with increased atelectasis/infiltrate. She was also found to have an acute DVT of the right peroneal vein. She was started on Eliquis and oral iron during this admission. She endorsed passing black stools for the past few days. Last BM was last night, she stated was dark, almost black and watery which she attributes to taking Miralax. FOBT yesterday was negative, repeat FOBT positive.  Oral iron was discontinued 11/16.  She denies having any further right sided abdominal/flank pain, abated 2 days ago.  She denies having any nausea or vomiting.  No reflux symptoms.  Underwent an EGD 12/30/2023 which identified a 2 cm hiatal hernia and H. pylori gastritis which was treated and eradicated.  On Pantoprazole  40 mg once daily at home.  No NSAID use. GI consult was requested for further evaluation  regarding suspected GI bleeding in setting of AC secondary to bilateral PE.  Echo 02/29/2024:  Left ventricular ejection fraction, by estimation, is 60 to 65%. The left ventricle has normal function. The left ventricle has no regional wall motion abnormalities. There is mild concentric left ventricular hypertrophy. Left ventricular diastolic function could not be evaluated. 1. Right ventricular systolic function is normal. The right ventricular size is normal. Tricuspid regurgitation signal is inadequate for assessing PA pressure. 2. The mitral valve is normal in structure. No evidence of mitral valve regurgitation. No evidence of mitral stenosis. 3. The aortic valve is normal in structure. Aortic valve regurgitation is not visualized. No aortic stenosis is present. 4. The inferior vena cava is normal in size with greater than 50% respiratory variability, suggesting right atrial pressure of 3 mmHg.  GI PROCEDURES:  EGD Sept 12, 2025 - Normal esophagus.  - Gastritis. Biopsied.  - Mild extrinsic compression in the gastric body (anterior wall) likely related to large hepatic cyst .  - 2 cm hiatal hernia.  - Normal examined duodenum.  - No obvious cause of upper abdominal pain         1. 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       -  ABUNDANT HELICOBACTER PYLORI ORGANISMS ARE IDENTIFIED ON THE H&E-STAINED      SLIDE.        2. Surgical [P], gastric body :       -  OXYNTIC MUCOSA WITH MODERATE CHRONIC MILDLY ACTIVE HELICOBACTER ASSOCIATED       GASTRITIS.       -  ABUNDANT HELICOBACTER PYLORI ORGANISMS ARE IDENTIFIED ON THE H&E-STAINED      SLIDE.    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 pylori infection    Hypertension     Iron deficiency anemia 08/05/2016   Pulmonary embolism (HCC) 02/29/2024    Past Surgical History:  Procedure Laterality Date   CATARACT EXTRACTION, BILATERAL     ESOPHAGOGASTRODUODENOSCOPY     EYE SURGERY     on her retina   liver cyst aspiration  01/2024    Prior to Admission medications   Medication Sig Start Date End Date Taking? Authorizing Provider  albuterol (VENTOLIN HFA) 108 (90 Base) MCG/ACT inhaler Inhale 2 puffs into the lungs every 6 (six) hours as needed for wheezing or shortness of breath. 03/05/24  Yes Regalado, Belkys A, MD  amoxicillin-clavulanate (AUGMENTIN) 875-125 MG tablet Take 1 tablet by mouth 2 (two) times daily for 5 days. 03/05/24 03/10/24 Yes Regalado, Belkys A, MD  losartan -hydrochlorothiazide (HYZAAR) 100-25 MG tablet TAKE 1 TABLET BY MOUTH DAILY 02/10/22  Yes Thedora Garnette HERO, MD  meclizine  (ANTIVERT ) 12.5 MG tablet Take 1 tablet (12.5 mg total) by mouth 3 (three) times daily as needed for dizziness. 09/18/20  Yes Cirigliano, Mary K, DO  acetaminophen  (TYLENOL ) 500 MG tablet Take 1 tablet (500 mg total) by mouth 3 (three) times daily. 03/05/24   Regalado, Belkys A, MD  apixaban (ELIQUIS) 5 MG TABS tablet Take 2 tablets (10 mg total) by mouth 2 (two) times daily for 3 days, THEN 1 tablet (5 mg total) 2 (two) times daily. 03/05/24 04/07/24  Regalado, Belkys A, MD  guaiFENesin (MUCINEX) 600 MG 12 hr tablet Take 2 tablets (1,200 mg total) by mouth 2 (two) times daily. 03/05/24   Regalado, Belkys A, MD  lidocaine (LIDODERM) 5 % Place 1 patch onto the skin daily. Remove & Discard patch within 12 hours or as directed by MD 03/06/24   Regalado, Belkys A, MD  nystatin (MYCOSTATIN) 100000 UNIT/ML suspension Take 5 mLs (500,000 Units total) by mouth 4 (four) times daily. Patient not taking: Reported on 02/29/2024 02/16/24   Regalado, Owen A, MD  pantoprazole  (PROTONIX ) 40 MG tablet Take 1 tablet (40 mg total) by mouth daily. 03/06/24 04/05/24  Regalado, Belkys A, MD   polyethylene glycol (MIRALAX / GLYCOLAX) 17 g packet Take 17 g by mouth 2 (two) times daily. 03/05/24   Regalado, Belkys A, MD  traMADol (ULTRAM) 50 MG tablet Take 1 tablet (50 mg total) by mouth every 6 (six) hours as needed for up to 5 days for moderate pain (pain score 4-6). 03/05/24 03/10/24  Regalado, Owen LABOR, MD    Current Facility-Administered Medications  Medication Dose Route Frequency Provider Last Rate Last Admin   acetaminophen  (TYLENOL ) tablet 650 mg  650 mg Oral Q6H PRN Celinda Alm Lot, MD   650 mg at 03/02/24 0039   Or   acetaminophen  (TYLENOL ) suppository 650 mg  650 mg Rectal Q6H PRN Celinda Alm Lot, MD       acetaminophen  (TYLENOL ) tablet 500 mg  500 mg Oral TID Regalado, Belkys A, MD   500 mg at 03/06/24 0844   apixaban (ELIQUIS) tablet 10 mg  10 mg Oral BID Nicholaus Quarry, RPH   10 mg at 03/05/24 2133  Followed by   NOREEN ON 03/08/2024] apixaban (ELIQUIS) tablet 5 mg  5 mg Oral BID Nicholaus Quarry, RPH       cefTRIAXone (ROCEPHIN) 2 g in sodium chloride  0.9 % 100 mL IVPB  2 g Intravenous Q24H Regalado, Belkys A, MD 200 mL/hr at 03/05/24 2139 2 g at 03/05/24 2139   feeding supplement (ENSURE PLUS HIGH PROTEIN) liquid 237 mL  237 mL Oral BID BM Regalado, Belkys A, MD   237 mL at 03/06/24 0844   guaiFENesin (MUCINEX) 12 hr tablet 1,200 mg  1,200 mg Oral BID Utomwen, Adesuwa, RPH   1,200 mg at 03/06/24 0844   HYDROmorphone (DILAUDID) injection 0.5 mg  0.5 mg Intravenous Q3H PRN Regalado, Belkys A, MD   0.5 mg at 03/02/24 0040   ipratropium-albuterol (DUONEB) 0.5-2.5 (3) MG/3ML nebulizer solution 3 mL  3 mL Nebulization TID Regalado, Belkys A, MD   3 mL at 03/06/24 0856   lidocaine (LIDODERM) 5 % 1 patch  1 patch Transdermal Daily Regalado, Belkys A, MD   1 patch at 03/06/24 0843   losartan  (COZAAR ) tablet 100 mg  100 mg Oral Daily Regalado, Belkys A, MD   100 mg at 03/06/24 0844   ondansetron  (ZOFRAN ) tablet 4 mg  4 mg Oral Q6H PRN Celinda Alm Lot, MD       Or    ondansetron  (ZOFRAN ) injection 4 mg  4 mg Intravenous Q6H PRN Celinda Alm Lot, MD       pantoprazole  (PROTONIX ) injection 40 mg  40 mg Intravenous Q12H Regalado, Belkys A, MD   40 mg at 03/06/24 0843   traMADol (ULTRAM) tablet 50 mg  50 mg Oral Q6H PRN Regalado, Belkys A, MD   50 mg at 03/04/24 0845    Allergies as of 02/29/2024 - Review Complete 02/29/2024  Allergen Reaction Noted   Hydralazine   02/16/2024    Family History  Problem Relation Age of Onset   Hypertension Mother    Hyperlipidemia Mother    Hypertension Father    Colon cancer Neg Hx    Esophageal cancer Neg Hx    Rectal cancer Neg Hx    Stomach cancer Neg Hx     Social History   Socioeconomic History   Marital status: Widowed    Spouse name: Not on file   Number of children: 4   Years of education: Not on file   Highest education level: Not on file  Occupational History   Not on file  Tobacco Use   Smoking status: Never   Smokeless tobacco: Never  Vaping Use   Vaping status: Never Used  Substance and Sexual Activity   Alcohol use: No   Drug use: No   Sexual activity: Not Currently    Birth control/protection: None  Other Topics Concern   Not on file  Social History Narrative   Not on file   Social Drivers of Health   Financial Resource Strain: Low Risk  (11/03/2021)   Overall Financial Resource Strain (CARDIA)    Difficulty of Paying Living Expenses: Not hard at all  Food Insecurity: No Food Insecurity (02/29/2024)   Hunger Vital Sign    Worried About Running Out of Food in the Last Year: Never true    Ran Out of Food in the Last Year: Never true  Transportation Needs: No Transportation Needs (02/29/2024)   PRAPARE - Administrator, Civil Service (Medical): No    Lack of Transportation (Non-Medical): No  Physical Activity: Sufficiently Active (  11/03/2021)   Exercise Vital Sign    Days of Exercise per Week: 5 days    Minutes of Exercise per Session: 30 min  Stress: No Stress  Concern Present (11/03/2021)   [employer] of Occupational Health - Occupational Stress Questionnaire    Feeling of Stress : Not at all  Social Connections: Moderately Integrated (02/29/2024)   Social Connection and Isolation Panel    Frequency of Communication with Friends and Family: Three times a week    Frequency of Social Gatherings with Friends and Family: Three times a week    Attends Religious Services: More than 4 times per year    Active Member of Clubs or Organizations: Yes    Attends Banker Meetings: 1 to 4 times per year    Marital Status: Widowed  Intimate Partner Violence: Not At Risk (02/29/2024)   Humiliation, Afraid, Rape, and Kick questionnaire    Fear of Current or Ex-Partner: No    Emotionally Abused: No    Physically Abused: No    Sexually Abused: No    Review of Systems: Gen: Denies fever, sweats or chills. No weight loss.  CV: Denies chest pain, palpitations or edema. Resp: Denies cough, shortness of breath of hemoptysis.  GI: See HPI. GU : Denies urinary burning, blood in urine, increased urinary frequency or incontinence. MS: Denies joint pain, muscles aches or weakness. Derm: Denies rash, itchiness, skin lesions or unhealing ulcers. Psych: Denies depression, anxiety, memory loss or confusion. Heme: Denies easy bruising, bleeding. Neuro:  Denies headaches, dizziness or paresthesias. Endo:  Denies any problems with DM, thyroid or adrenal function.  Physical Exam: Vital signs in last 24 hours: Temp:  [98.5 F (36.9 C)-98.9 F (37.2 C)] 98.5 F (36.9 C) (11/18 0500) Pulse Rate:  [79-85] 79 (11/18 0500) Resp:  [16-18] 16 (11/18 0500) BP: (144-166)/(68-70) 144/70 (11/18 0500) SpO2:  [93 %-96 %] 96 % (11/18 0856) Last BM Date : 03/06/24 General: Alert 83 year old female in no acute distress. Head:  Normocephalic and atraumatic. Eyes:  No scleral icterus. Conjunctiva pink. Ears:  Normal auditory acuity. Nose:  No deformity,  discharge or lesions. Mouth:  Dentition intact. No ulcers or lesions.  Neck:  Supple. No lymphadenopathy or thyromegaly.  Lungs: Breath sounds clear throughout. No wheezes, rhonchi or crackles.  Heart: Regular rate and rhythm, no murmurs Abdomen: Abdomen soft, nondistended.  Nontender.  Positive bowel sounds to all 4 quadrants. Rectal: Deferred. Musculoskeletal:  Symmetrical without gross deformities.  Pulses:  Normal pulses noted. Extremities:  Without clubbing or edema. Neurologic:  Alert and  oriented x 4. No focal deficits.  Skin:  Intact without significant lesions or rashes. Psych:  Alert and cooperative. Normal mood and affect.  Intake/Output from previous day: 11/17 0701 - 11/18 0700 In: 502 [P.O.:480; IV Piggyback:22] Out: -  Intake/Output this shift: No intake/output data recorded.  Lab Results: Recent Labs    03/04/24 1225 03/05/24 0940 03/06/24 0919  WBC 6.1 5.7 8.2  HGB 10.2* 10.7* 11.5*  HCT 34.0* 33.3* 36.2  PLT 239 270 288   BMET Recent Labs    03/04/24 1225  NA 138  K 4.8  CL 99  CO2 29  GLUCOSE 118*  BUN 14  CREATININE 0.80  CALCIUM 9.0   LFT No results for input(s): PROT, ALBUMIN, AST, ALT, ALKPHOS, BILITOT, BILIDIR, IBILI in the last 72 hours. PT/INR No results for input(s): LABPROT, INR in the last 72 hours. Hepatitis Panel No results for input(s): HEPBSAG, HCVAB, HEPAIGM, HEPBIGM  in the last 72 hours.    Studies/Results: No results found.  IMPRESSION/PLAN:  83 year old female admitted 02/29/2024 with right sided abdominal/flank pain. CTAP with contrast 11/12 identified a small hiatal hernia, moderate diverticulosis in the sigmoid colon without diverticulitis, small umbilical hernia and a right inguinal hernia without acute abdominal pathology to explain her symptoms. Right sided abdominal/flank pain abated 2 days ago. - Continue to monitor patient for recurrent abdominal pain  CTAP and CTA identified  bilateral pulmonary emboli and acute DVT of the right peroneal vein.  Initially received Heparin IV then transitioned to on Eliquis. - Management per the hospitalist  Pneumonia, CTA showed a small to moderate right pleural effusion with increased atelectasis/infiltrate.  On Ceftriaxone 2 g IV daily. - Management per the hospitalist  Normocytic anemia. Hg 10.7 -> 11.5.  FOBT negative 11/17, repeat FOBT positive 11/17.  Iron discontinued 11/16. - Continue Eliquis for now, await further recommendations per Dr. Stacia  - Continue to monitor the patient closely for GI bleeding/melena - CBC in am  H. pylori gastritis per EGD 12/2023, treated and eradicated - Continue Pantoprazole  40 mg IV twice daily  Hepatic cysts. Previously admitted 10/26 - 02/17/2024 secondary to abdominal pain associated with large liver cysts, status post drainage of large liver cyst by IR. Path report identified acellular mucin, no malignant cells.  Grade I diastolic CHF.  Echo with normal LVEF, normal RV function.   Karen [name]  03/06/2024, 12:55 PM  ----------------------------------------------------------------------------------------------  I have taken a history, reviewed the chart and examined the patient. I performed a substantive portion of this encounter, including complete performance of at least one of the key components, in conjunction with the APP. I agree with the APP's note, impression and recommendations  83 year old female with history of hypertension and symptomatic liver cyst status post aspiration, admitted with new DVT in PE and pneumonia.  GI consulted for melanic appearing stools, with 1 stool test positive for occult blood.  Patient was taking iron supplements at the time of the dark stools.  Her hemoglobin has remained stable since admission and her BUN is not elevated. She had an upper endoscopy in September which showed H. pylori gastritis, which was subsequently eradicated, as  confirmed with stool antigen. Low suspicion for clinically significant bleed given stable hemoglobin and possibility of her dark stools from iron. Recommend continued observation and monitoring of hemoglobin.  Would not recommend stopping anticoagulation.   Should she experience significant drop in hemoglobin or other more convincing evidence of GI bleeding, we could consider repeat EGD +/- colonoscopy.  Will follow peripherally  Latrell Reitan E. Stacia, MD [facility]  Moderate complex medical decision making (this includes chart review, review of results, face-to-face time used for counseling as well as treatment plan and follow-up. The patient was provided an opportunity to ask questions and all were answered. The patient agreed with the plan and demonstrated an understanding of the instructions

## 2024-03-06 NOTE — Plan of Care (Signed)

## 2024-03-06 NOTE — Progress Notes (Signed)
 PROGRESS NOTE    Karen [name]  FMW:[id number] DOB: 08/11/1940 DOA: 02/29/2024 PCP: Pcp, No   Brief Narrative: 83 year old with past medical history significant for herpes zoster, H. pylori, hypertension, GERD, iron deficiency anemia, hyperlipidemia, grade 1 diastolic dysfunction, hypertensive heart disease, hepatic cystic disease who was admitted 02/12/2024 until 10/31st/2025 and status post drainage of large liver cyst by IR presents to the ED complaining of right lower back and right upper quadrant abdominal pain initially thought similar to her previous liver cyst.  Subsequent evaluation in the ED CT abdomen and pelvis with contrast show evidence of patient's known numerous liver cysts with interval decrease in size of the previously seen largest cyst in the left lobe post aspiration.  New bilateral proximal lower lobe pulmonary emboli.  New small right pleural effusion with associated hazy right basilar opacity likely atelectasis or less likely infection.  Patient admitted and treated for bilateral small PE, acute deep vein thrombosis right peroneal vein, pneumonia, small pleural effusion, she has been stable from PE and pneumonia,  subsequently developed melena, occult blood positive x 1, GI has been consulted plan to monitor hemoglobin.     Assessment & Plan:   Principal Problem:   Pulmonary embolism (HCC) Active Problems:   Iron deficiency anemia   Gastroesophageal reflux disease without esophagitis   Essential hypertension   Chronic diastolic heart failure (HCC)   Cystic disease of liver   Acute deep vein thrombosis (DVT) of right peroneal vein (HCC)   Mixed hyperlipidemia  1-Acute BL Pulmonary embolism  Acute deep vein thrombosis right peroneal vein - Echo: Normal EF, normal RV function.  - Doppler: Right consistent with acute deep vein thrombosis involving the right peroneal veins. - Continue with Eliquis.  -Continue with  Lydocaine patch, schedule tylenol . Incentive  spirometry.  - Due to worsening chest pain and cough CTA chest was obtained which confirm  small bilateral PE.  Pneumonia and a small pleural effusion. Patient is stable, off of oxygen.  PNA:  Respiratory , Bronchitis.  Develops Upper resp wheezing.  Continue with Guaifenesin.  CTA showed small to moderate pleural effusion and right lower lobe infiltrates.  Continue with  IV ceftriaxone and completed azithromycin.  Continue with Breathing treatment.  Pulmonologist consulted. Plan to follow up Pleural effusion out patient.  Discharge on Augmentin.   Black Stool/Melena Had another episode fo black stool on 11/17  Change protonix  to BID>  Occult blood one negative, one positive.  I have discontinue Iron supplement.  GI has been consulted, due to melena and patient required Eliquis Hb stable.   Iron deficiency anemia Iron and Tsat low.  Hold  iron supplement.    GERD;Continue with PPI  Hypertension: Resume Cozaar  and hydrochlorothiazide, SBP increasing.   Cystic disease of liver: She was referred to hepatologist  Mixed hyperlipidemia  AKI; mild increased cr to 1.0. baseline 0.7 Improved with fluids.   Estimated body mass index is 25.96 kg/m as calculated from the following:   Height as of this encounter: 5' 5 (1.651 m).   Weight as of this encounter: 70.8 kg.   DVT prophylaxis: heparin  Code Status: DNR Family Communication: Discussed with daughter over phone. 11/18 Disposition Plan:  Status is: Observation The patient will require care spanning > 2 midnights and should be moved to inpatient because: management of chest pain, PE<>     Consultants:  none  Procedures:  ECHO Doppler  Antimicrobials:   Subjective: Patient is doing okay, chest pain is better.  She had  some diarrhea yesterday after laxative.  Is still having black stool last night. No bowel movement this morning. Objective: Vitals:   03/05/24 2034 03/05/24 2040 03/06/24 0500 03/06/24 0856   BP:   (!) 144/70   Pulse: 84 79 79   Resp: 18 16 16    Temp:   98.5 F (36.9 C)   TempSrc:   Oral   SpO2: 94% 95% 96% 96%  Weight:      Height:        Intake/Output Summary (Last 24 hours) at 03/06/2024 1642 Last data filed at 03/05/2024 1845 Gross per 24 hour  Intake 21.98 ml  Output --  Net 21.98 ml   Filed Weights   02/29/24 1140  Weight: 70.8 kg    Examination:  General exam:NAD Respiratory system: CTA  Cardiovascular system: SS 1, S 2 RRR Gastrointestinal system: BS present, soft, nt Central nervous system: aAlert  Data Reviewed: I have personally reviewed following labs and imaging studies  CBC: Recent Labs  Lab 02/29/24 0956 02/29/24 1006 03/02/24 0603 03/03/24 1121 03/04/24 1225 03/05/24 0940 03/06/24 0919  WBC 12.2*   < > 10.8* 8.1 6.1 5.7 8.2  NEUTROABS 9.4*  --   --   --   --   --   --   HGB 11.8*   < > 10.2* 10.0* 10.2* 10.7* 11.5*  HCT 36.4   < > 30.9* 31.1* 34.0* 33.3* 36.2  MCV 86.9   < > 86.8 86.1 94.4 86.7 88.1  PLT 228   < > 199 220 239 270 288   < > = values in this interval not displayed.   Basic Metabolic Panel: Recent Labs  Lab 03/01/24 0809 03/02/24 0603 03/03/24 1121 03/04/24 1225 03/06/24 1218  NA 136 133* 137 138 138  K 3.5 3.7 4.4 4.8 3.9  CL 98 97* 100 99 103  CO2 26 28 29 29 23   GLUCOSE 101* 107* 122* 118* 99  BUN 21 27* 16 14 16   CREATININE 1.02* 0.95 0.80 0.80 0.85  CALCIUM 9.2 8.7* 9.1 9.0 9.5   GFR: Estimated Creatinine Clearance: 48.6 mL/min (by C-G formula based on SCr of 0.85 mg/dL). Liver Function Tests: Recent Labs  Lab 02/29/24 0956 03/01/24 0809  AST 27 32  ALT 20 16  ALKPHOS 85 85  BILITOT 0.8 0.6  PROT 7.8 7.7  ALBUMIN 3.8 3.4*   Recent Labs  Lab 02/29/24 0956  LIPASE 31   No results for input(s): AMMONIA in the last 168 hours. Coagulation Profile: No results for input(s): INR, PROTIME in the last 168 hours. Cardiac Enzymes: No results for input(s): CKTOTAL, CKMB,  CKMBINDEX, TROPONINI in the last 168 hours. BNP (last 3 results) No results for input(s): PROBNP in the last 8760 hours. HbA1C: No results for input(s): HGBA1C in the last 72 hours. CBG: No results for input(s): GLUCAP in the last 168 hours. Lipid Profile: No results for input(s): CHOL, HDL, LDLCALC, TRIG, CHOLHDL, LDLDIRECT in the last 72 hours. Thyroid Function Tests: No results for input(s): TSH, T4TOTAL, FREET4, T3FREE, THYROIDAB in the last 72 hours. Anemia Panel: No results for input(s): VITAMINB12, FOLATE, FERRITIN, TIBC, IRON, RETICCTPCT in the last 72 hours. Sepsis Labs: No results for input(s): PROCALCITON, LATICACIDVEN in the last 168 hours.  No results found for this or any previous visit (from the past 240 hours).       Radiology Studies: No results found.        Scheduled Meds:  acetaminophen   500 mg Oral TID  apixaban  10 mg Oral BID   Followed by   NOREEN ON 03/08/2024] apixaban  5 mg Oral BID   feeding supplement  237 mL Oral BID BM   guaiFENesin  1,200 mg Oral BID   [START ON 03/07/2024] hydrochlorothiazide  25 mg Oral Daily   ipratropium-albuterol  3 mL Nebulization TID   lidocaine  1 patch Transdermal Daily   losartan   100 mg Oral Daily   pantoprazole  (PROTONIX ) IV  40 mg Intravenous Q12H   Continuous Infusions:  cefTRIAXone (ROCEPHIN)  IV 2 g (03/05/24 2139)      LOS: 2 days    Time spent: 35 Minutes    Bailey Faiella A Chaden Doom, MD [facility]   If 7PM-7AM, please contact night-coverage [email or web address]  03/06/2024, 4:42 PM

## 2024-03-06 NOTE — Plan of Care (Signed)

## 2024-03-07 ENCOUNTER — Other Ambulatory Visit (HOSPITAL_COMMUNITY): Payer: Self-pay

## 2024-03-07 DIAGNOSIS — J189 Pneumonia, unspecified organism: Secondary | ICD-10-CM | POA: Insufficient documentation

## 2024-03-07 HISTORY — DX: Pneumonia, unspecified organism: J18.9

## 2024-03-07 LAB — CBC
HCT: 34.2 % — ABNORMAL LOW (ref 36.0–46.0)
Hemoglobin: 11.1 g/dL — ABNORMAL LOW (ref 12.0–15.0)
MCH: 28 pg (ref 26.0–34.0)
MCHC: 32.5 g/dL (ref 30.0–36.0)
MCV: 86.4 fL (ref 80.0–100.0)
Platelets: 281 K/uL (ref 150–400)
RBC: 3.96 MIL/uL (ref 3.87–5.11)
RDW: 14.6 % (ref 11.5–15.5)
WBC: 6.2 K/uL (ref 4.0–10.5)
nRBC: 0 % (ref 0.0–0.2)

## 2024-03-07 MED ORDER — PANTOPRAZOLE SODIUM 40 MG PO TBEC
DELAYED_RELEASE_TABLET | ORAL | 0 refills | Status: AC
  Filled 2024-03-07: qty 58, 58d supply, fill #0

## 2024-03-07 MED ORDER — IPRATROPIUM-ALBUTEROL 0.5-2.5 (3) MG/3ML IN SOLN: 3.0000 mL | Freq: Four times a day (QID) | RESPIRATORY_TRACT | Status: DC | PRN

## 2024-03-07 MED ORDER — LIDOCAINE 5 % EX PTCH
1.0000 | MEDICATED_PATCH | Freq: Every day | CUTANEOUS | 0 refills | Status: AC
  Filled 2024-03-07: qty 30, 30d supply, fill #0

## 2024-03-07 MED ORDER — APIXABAN 5 MG PO TABS
ORAL_TABLET | ORAL | 1 refills | Status: DC
  Filled 2024-03-07: qty 60, 30d supply, fill #0

## 2024-03-07 MED ORDER — APIXABAN 5 MG PO TABS
5.0000 mg | ORAL_TABLET | Freq: Two times a day (BID) | ORAL | 1 refills | Status: DC
  Filled 2024-03-07: qty 60, 30d supply, fill #0

## 2024-03-07 MED ORDER — ACETAMINOPHEN 500 MG PO TABS: 500.0000 mg | ORAL_TABLET | Freq: Three times a day (TID) | ORAL | Status: AC | PRN

## 2024-03-07 MED ORDER — ALBUTEROL SULFATE HFA 108 (90 BASE) MCG/ACT IN AERS
2.0000 | INHALATION_SPRAY | Freq: Four times a day (QID) | RESPIRATORY_TRACT | 2 refills | Status: AC | PRN
  Filled 2024-03-07: qty 6.7, 30d supply, fill #0

## 2024-03-07 MED ORDER — APIXABAN 5 MG PO TABS
ORAL_TABLET | ORAL | 1 refills | Status: DC
  Filled 2024-03-07: qty 60, 29d supply, fill #0

## 2024-03-07 NOTE — Assessment & Plan Note (Signed)
Outpatient f/u  

## 2024-03-07 NOTE — Assessment & Plan Note (Addendum)
 Heme positive with 2 episodes of melena Prior EGD in 12/2023 with H pylori gastritis which was confirmed to have been eradicated by stool antigen Changed protonix  to BID x 4 weeks and then resume once daily Hgb stable GI signed off

## 2024-03-07 NOTE — Assessment & Plan Note (Addendum)
 Acute BL Pulmonary embolism/acute deep vein thrombosis right peroneal vein Echo: Normal EF, normal RV function Continue Eliquis Continue lidocaine patch, tylenol  prn Patient is stable, off of oxygen She feels well and wants to go home today

## 2024-03-07 NOTE — Assessment & Plan Note (Deleted)
Continue losartan and hydrochlorothiazide

## 2024-03-07 NOTE — Assessment & Plan Note (Deleted)
 Develops Upper resp wheezing.  Continue with Guaifenesin.  CTA showed small to moderate pleural effusion and right lower lobe infiltrates.  Continue with  IV ceftriaxone and completed azithromycin.  Continue with Breathing treatment.  Pulmonologist consulted. Plan to follow up Pleural effusion out patient.  Discharge on Augmentin.

## 2024-03-07 NOTE — Assessment & Plan Note (Deleted)
 Acute BL Pulmonary embolism  Acute deep vein thrombosis right peroneal vein - Echo: Normal EF, normal RV function.  - Doppler: Right consistent with acute deep vein thrombosis involving the right peroneal veins. - Continue with Eliquis.  -Continue with  Lydocaine patch, schedule tylenol . Incentive spirometry.  - Due to worsening chest pain and cough CTA chest was obtained which confirm  small bilateral PE.  Pneumonia and a small pleural effusion. Patient is stable, off of oxygen

## 2024-03-07 NOTE — Assessment & Plan Note (Signed)
Continue losartan and hydrochlorothiazide

## 2024-03-07 NOTE — Plan of Care (Signed)
  Problem: Clinical Measurements: Goal: Ability to maintain clinical measurements within normal limits will improve Outcome: Progressing   Problem: Clinical Measurements: Goal: Will remain free from infection Outcome: Progressing    Problem: Nutrition: Goal: Adequate nutrition will be maintained Outcome: Progressing   Problem: Pain Managment: Goal: General experience of comfort will improve and/or be controlled Outcome: Progressing

## 2024-03-07 NOTE — Assessment & Plan Note (Signed)
Referred to hepatology

## 2024-03-07 NOTE — Assessment & Plan Note (Deleted)
 Continue PPI.

## 2024-03-07 NOTE — Progress Notes (Signed)
 Occupational Therapy Treatment Patient Details Name: Karen [name] MRN: [id number] DOB: [birth year] Today's Date: 03/07/2024   History of present illness Pt is 83 yr old female admitted on 02/29/24 with acute PE and R LE DVT.  Pt with medical history including but not limited to herpes zoster, H. pylori, hypertension, GERD, iron deficiency anemia, hyperlipidemia, grade 1 diastolic dysfunction, hypertensive heart disease, hepatic cystic disease with recent admission for aspiration of cyst   OT comments  The patient performed lower body dressing, sit to stand, toileting at bathroom level, and hand washing standing at the sink independently. She has demonstrated gradual functional progress as a result of receiving OT services during her hospital stay. OT reinforced recommendations for her safe and optimal ADL and IADL participation in the home; she presented with good understanding. She does not require further OT services, therefore OT will sign off and recommend she return home at discharge.       If plan is discharge home, recommend the following:  Other (comment) (N/A)   Equipment Recommendations  Tub/shower seat    Recommendations for Other Services          Mobility Bed Mobility        General bed mobility comments: She was received seated in the bedside chair.    Transfers Overall transfer level: Independent Equipment used: None Transfers: Sit to/from Stand Sit to Stand: Independent                 Balance Overall balance assessment: Mild deficits observed, not formally tested         ADL either performed or assessed with clinical judgement   ADL Overall ADL's : Independent;At baseline     Grooming: Independent;Standing Grooming Details (indicate cue type and reason): She performed hand washing in standing at sink level.             Lower Body Dressing: Independent;Sitting/lateral leans Lower Body Dressing Details (indicate cue type and reason): She  doffed then donned her slippers seated in the bedside chair. Toilet Transfer: Independent;Ambulation Toilet Transfer Details (indicate cue type and reason): The patient ambulated to and from the bathroom in her room without an assistive device. She transferred onto and off the toilet without assistance. Toileting- Architect and Hygiene: Independent;Sit to/from stand Toileting - Clothing Manipulation Details (indicate cue type and reason): The patient performed toileting management at bathroom level.                      Communication Communication Communication: No apparent difficulties   Cognition Arousal: Alert Behavior During Therapy: WFL for tasks assessed/performed Cognition: No apparent impairments      Following commands: Intact        Cueing   Cueing Techniques: Verbal cues             Pertinent Vitals/ Pain       Pain Assessment Pain Location: The pt did not report pain during the session.   Frequency   (N/A)        Progress Toward Goals  OT Goals(current goals can now be found in the care plan section)  Progress towards OT goals: Goals met/education completed, patient discharged from OT         AM-PAC OT 6 Clicks Daily Activity     Outcome Measure   Help from another person eating meals?: None Help from another person taking care of personal grooming?: None Help from another person toileting, which includes using toliet, bedpan, or urinal?:  None Help from another person bathing (including washing, rinsing, drying)?: None Help from another person to put on and taking off regular upper body clothing?: None Help from another person to put on and taking off regular lower body clothing?: None 6 Click Score: 24    End of Session Equipment Utilized During Treatment: Other (comment) (N/A)  OT Visit Diagnosis: Muscle weakness (generalized) (M62.81)   Activity Tolerance Patient tolerated treatment well   Patient Left in chair;with  call bell/phone within reach   Nurse Communication Other (comment) (patient requesting to take a shower before she discharges home)        Time: 8854-8845 OT Time Calculation (min): 9 min  Charges: OT General Charges $OT Visit: 1 Visit OT Treatments $Self Care/Home Management : 8-22 mins    Delanna JINNY Lesches, OTR/L 03/07/2024, 2:15 PM

## 2024-03-07 NOTE — Assessment & Plan Note (Deleted)
Outpatient f/u  

## 2024-03-07 NOTE — Hospital Course (Signed)
 83yo with h/o herpes zoster, HTN, IDA, HLD, chronic HFpEF, HTN, and 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  CT with numerous liver cysts and new BLL PE.  +DVT of R peroneal vein.  Started on hepatin, also treated with pneumonia.  Developed melena, heme +, GI consulted.

## 2024-03-07 NOTE — Plan of Care (Signed)

## 2024-03-07 NOTE — Progress Notes (Signed)
 Mobility Specialist - Progress Note   03/07/24 1000  Mobility  Activity Ambulated with assistance  Level of Assistance Modified independent, requires aide device or extra time  Assistive Device None  Distance Ambulated (ft) 120 ft  Range of Motion/Exercises Active  Activity Response Tolerated well  Mobility Referral Yes  Mobility visit 1 Mobility  Mobility Specialist Start Time (ACUTE ONLY) 1038  Mobility Specialist Stop Time (ACUTE ONLY) 1049  Mobility Specialist Time Calculation (min) (ACUTE ONLY) 11 min   Received in bed and agreed to mobility, had no issues throughout session. Returned to bed with all needs met.  Cyndee Ada Mobility Specialist  '

## 2024-03-07 NOTE — Assessment & Plan Note (Deleted)
 Heme positive Had another episode fo black stool on 11/17  Change protonix  to BID>  Occult blood one negative, one positive.  I have discontinue Iron supplement.  GI has been consulted, due to melena and patient required Eliquis  Hb stable.

## 2024-03-07 NOTE — Discharge Summary (Addendum)
 Physician Discharge Summary   Patient: Karen [name] MRN: [id number] DOB: July 09, 1940  Admit date:     02/29/2024  Discharge date: 03/07/24  Discharge Physician: Delon Herald   PCP: Pcp, No   Recommendations at discharge:   You are being discharged on Eliquis; take twice daily and do not miss doses You are being discharged with home physical therapy Referral made for PCP, call for appointment Follow up with pulmonology in 1 week, call for appointment  Discharge Diagnoses: Principal Problem:   Pulmonary embolism (HCC) Active Problems:   Iron deficiency anemia   Gastroesophageal reflux disease without esophagitis   Essential hypertension   Chronic diastolic heart failure (HCC)   Cystic disease of liver   Acute deep vein thrombosis (DVT) of right peroneal vein (HCC)   Mixed hyperlipidemia   Melena   Right lower lobe pneumonia  Hospital Course: 83yo with h/o herpes zoster, HTN, IDA, HLD, chronic HFpEF, HTN, and 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  CT with numerous liver cysts and new BLL PE.  +DVT of R peroneal vein.  Started on hepatin, also treated with pneumonia.  Developed melena, heme +, GI consulted.  Assessment and Plan:  Assessment & Plan Pulmonary embolism (HCC) Acute deep vein thrombosis (DVT) of right peroneal vein (HCC) Acute BL Pulmonary embolism/acute deep vein thrombosis right peroneal vein Echo: Normal EF, normal RV function Continue Eliquis Continue lidocaine patch, tylenol  prn Patient is stable, off of oxygen She feels well and wants to go home today Right lower lobe pneumonia CTA showed small to moderate pleural effusion and right lower lobe infiltrates Completed 5 days of ceftriaxone and azithromycin Pulmonologist consulted - plan to follow up pleural effusion outpatient Melena Heme positive with 2 episodes of melena Prior EGD in 12/2023 with H pylori  gastritis which was confirmed to have been eradicated by stool antigen Changed protonix  to BID x 4 weeks and then resume once daily Hgb stable GI signed off Iron deficiency anemia Hold iron supplementation at dc Gastroesophageal reflux disease without esophagitis Continue PPI - BID x 4 weeks and then daily Essential hypertension Continue losartan  and hydrochlorothiazide Chronic diastolic heart failure (HCC) Compensated Cystic disease of liver Referred to hepatology Mixed hyperlipidemia Outpatient f/u       Consultants: Pulmonology GI PT OT TOC team   Procedures: DVT US  11/12 Echocardiogram 11/13   Antibiotics: Azithromycin 11/15-18 Ceftriaxone 11/15-      Pain control - [place]  Controlled Substance Reporting System database was reviewed. and patient was instructed, not to drive, operate heavy machinery, perform activities at heights, swimming or participation in water activities or provide baby-sitting services while on Pain, Sleep and Anxiety Medications; until their outpatient Physician has advised to do so again. Also recommended to not to take more than prescribed Pain, Sleep and Anxiety Medications.   Disposition: Home Diet recommendation:  Discharge Diet Orders (From admission, onward)     Start     Ordered   03/05/24 0000  Diet - low sodium heart healthy        03/05/24 1130            DISCHARGE MEDICATION: Allergies as of 03/07/2024       Reactions   Hydralazine     Report numbness and tingling hands and feet. Anxiety.         Medication List     STOP taking these medications    nystatin  100000 UNIT/ML suspension Commonly known as: MYCOSTATIN        TAKE these medications    acetaminophen  500 MG tablet Commonly known as: TYLENOL  Take 1 tablet (500 mg total) by mouth every 8 (eight) hours as needed for mild pain (pain score 1-3), moderate pain (pain score 4-6) or headache.   albuterol  108 (90 Base) MCG/ACT inhaler Commonly  known as: VENTOLIN  HFA Inhale 2 puffs into the lungs every 6 (six) hours as needed for wheezing or shortness of breath.   apixaban  5 MG Tabs tablet Commonly known as: ELIQUIS  Take 1 tablet (5 mg total) by mouth 2 (two) times daily.   lidocaine  5 % Commonly known as: LIDODERM  Place 1 patch onto the skin daily. Remove & Discard patch within 12 hours or as directed by MD   losartan -hydrochlorothiazide  100-25 MG tablet Commonly known as: HYZAAR TAKE 1 TABLET BY MOUTH DAILY   meclizine  12.5 MG tablet Commonly known as: ANTIVERT  Take 1 tablet (12.5 mg total) by mouth 3 (three) times daily as needed for dizziness.   pantoprazole  40 MG tablet Commonly known as: PROTONIX  Take 1 tablet (40 mg total) by mouth daily for 28 days, THEN 1 tablet daily as needed. Start taking on: March 07, 2024               Durable Medical Equipment  (From admission, onward)           Start     Ordered   03/05/24 1313  For home use only DME 4 wheeled rolling walker with seat  Once       Question:  Patient needs a walker to treat with the following condition  Answer:  Balance disorder   03/05/24 1312            Follow-up Information     [facility]. Schedule an appointment as soon as possible for a visit on 03/03/2024.   Why: Please call this provider to establish PCP services. Once established with a PCP, they can refer you to Home Health PT/OT services. Contact information: [address] [place]  [address] [phone]        Catherine Cools, MD Follow up in 1 week(s).   Specialties: Pulmonary Disease, Internal Medicine Contact information: [address] [place] [address] [phone]                Discharge Exam:    Subjective: Feeling much better, asking to go home.   Objective: Vitals:   03/07/24 0451 03/07/24 0742  BP: (!) 145/65   Pulse: 81   Resp: 20   Temp: 98.4  F (36.9 C)   SpO2: 93% 95%   No intake or output data in the 24 hours ending 03/07/24 1425 Filed Weights   02/29/24 1140  Weight: 70.8 kg    Exam:  General:  Appears calm and comfortable and is in NAD, up in chair, on RA Eyes:  normal lids, iris ENT:  grossly normal hearing, lips & tongue, mmm Cardiovascular:  RRR, no m/r/g. No LE edema.  Respiratory:   CTA bilaterally with no wheezes/rales/rhonchi.  Normal respiratory effort. Abdomen:  soft, NT, ND Skin:  no rash or induration seen on limited exam Musculoskeletal:  grossly normal tone BUE/BLE, good ROM, no bony abnormality Psychiatric:  grossly normal mood and affect, speech fluent and appropriate, AOx3 Neurologic:  CN 2-12 grossly intact, moves all extremities in coordinated fashion  Data Reviewed: I have reviewed the  patient's lab results since admission.  Pertinent labs for today include:   None today    Condition at discharge: improving  The results of significant diagnostics from this hospitalization (including imaging, microbiology, ancillary and laboratory) are listed below for reference.   Imaging Studies: CT Angio Chest Pulmonary Embolism (PE) W or WO Contrast Result Date: 03/03/2024 CLINICAL DATA:  Pulmonary embolism suspected, high probability. EXAM: CT ANGIOGRAPHY CHEST WITH CONTRAST TECHNIQUE: Multidetector CT imaging of the chest was performed using the standard protocol during bolus administration of intravenous contrast. Multiplanar CT image reconstructions and MIPs were obtained to evaluate the vascular anatomy. RADIATION DOSE REDUCTION: This exam was performed according to the departmental dose-optimization program which includes automated exposure control, adjustment of the mA and/or kV according to patient size and/or use of iterative reconstruction technique. CONTRAST:  75mL OMNIPAQUE  IOHEXOL  350 MG/ML SOLN COMPARISON:  12/15/2023, 02/29/2024. FINDINGS: Cardiovascular: The heart is enlarged and there is no  pericardial effusion. There is atherosclerotic calcification of the aorta a without evidence of aneurysm. The pulmonary trunk is normal in caliber. Small segmental and subsegmental pulmonary emboli are present in the right lower lobe and in the lobar, segmental, and subsegmental pulmonary arteries on the left. No evidence of right heart strain. Mediastinum/Nodes: No mediastinal, hilar, or axillary lymphadenopathy is seen. The thyroid gland, trachea, and esophagus are within normal limits. There is a small hiatal hernia. Lungs/Pleura: There is a small to moderate pleural effusion on the right. Compressive atelectasis versus infiltrate is noted in the right lower lobe. No pneumothorax bilaterally. Upper Abdomen: Multiple large hepatic cysts are present. No acute abnormality. Musculoskeletal: Degenerative changes are present in the thoracic spine. No acute osseous abnormality. Review of the MIP images confirms the above findings. IMPRESSION: 1. Small bilateral pulmonary emboli, improved from the prior exam. 2. Small to moderate right pleural effusion with increased atelectasis or infiltrate. 3. Small hiatal hernia. 4. Aortic atherosclerosis. Electronically Signed   By: Leita Birmingham M.D.   On: 03/03/2024 16:14   DG CHEST PORT 1 VIEW Result Date: 03/01/2024 CLINICAL DATA:  Chest pain. EXAM: PORTABLE CHEST 1 VIEW COMPARISON:  Chest CT 12/15/2023, lung bases from abdominopelvic CT yesterday. FINDINGS: Unchanged elevation of right hemidiaphragm. The cardiomediastinal contours are normal for technique. The lungs are clear. Pulmonary vasculature is normal. Right pleural effusion on CT is not well demonstrated by radiograph. No pulmonary edema. No pneumothorax. No acute osseous abnormalities are seen. IMPRESSION: 1. No acute chest findings. 2. Unchanged elevation of right hemidiaphragm. Electronically Signed   By: Andrea Gasman M.D.   On: 03/01/2024 16:20   ECHOCARDIOGRAM COMPLETE Result Date: 03/01/2024     ECHOCARDIOGRAM REPORT   Patient Name:   HOPELYNN [name] Date of Exam: 03/01/2024 Medical Rec #:  [id number]      Height:       65.0 in Accession #:    [id number]     Weight:       156.0 lb Date of Birth:  1941-01-04      BSA:          1.780 m Patient Age:    83 years       BP:           114/56 mmHg Patient Gender: F              HR:           76 bpm. Exam Location:  Inpatient Procedure: 2D Echo, Color Doppler and Cardiac Doppler (Both Spectral and Color  Flow Doppler were utilized during procedure). Indications:    Pulmonary Embolus I26.09  History:        Patient has no prior history of Echocardiogram examinations.                 CHF; Risk Factors:Hypertension.  Sonographer:    Tinnie Gosling RDCS Referring Phys: [phone] DAVID MANUEL ORTIZ  Sonographer Comments: Technically difficult study due to poor echo windows, no apical window and no subcostal window. Pt in lot of pain, unable to position or breathing exercises IMPRESSIONS  1. Left ventricular ejection fraction, by estimation, is 60 to 65%. The left ventricle has normal function. The left ventricle has no regional wall motion abnormalities. There is mild concentric left ventricular hypertrophy. Left ventricular diastolic function could not be evaluated.  2. Right ventricular systolic function is normal. The right ventricular size is normal. Tricuspid regurgitation signal is inadequate for assessing PA pressure.  3. The mitral valve is normal in structure. No evidence of mitral valve regurgitation. No evidence of mitral stenosis.  4. The aortic valve is normal in structure. Aortic valve regurgitation is not visualized. No aortic stenosis is present.  5. The inferior vena cava is normal in size with greater than 50% respiratory variability, suggesting right atrial pressure of 3 mmHg. FINDINGS  Left Ventricle: Left ventricular ejection fraction, by estimation, is 60 to 65%. The left ventricle has normal function. The left ventricle has no regional wall  motion abnormalities. The left ventricular internal cavity size was normal in size. There is  mild concentric left ventricular hypertrophy. Left ventricular diastolic function could not be evaluated. Right Ventricle: The right ventricular size is normal. No increase in right ventricular wall thickness. Right ventricular systolic function is normal. Tricuspid regurgitation signal is inadequate for assessing PA pressure. Left Atrium: Left atrial size was normal in size. Right Atrium: Right atrial size was normal in size. Pericardium: There is no evidence of pericardial effusion. Mitral Valve: The mitral valve is normal in structure. No evidence of mitral valve regurgitation. No evidence of mitral valve stenosis. Tricuspid Valve: The tricuspid valve is normal in structure. Tricuspid valve regurgitation is mild . No evidence of tricuspid stenosis. Aortic Valve: The aortic valve is normal in structure. Aortic valve regurgitation is not visualized. No aortic stenosis is present. Pulmonic Valve: The pulmonic valve was normal in structure. Pulmonic valve regurgitation is not visualized. No evidence of pulmonic stenosis. Aorta: The aortic root is normal in size and structure. Venous: The inferior vena cava is normal in size with greater than 50% respiratory variability, suggesting right atrial pressure of 3 mmHg. IAS/Shunts: No atrial level shunt detected by color flow Doppler.  LEFT VENTRICLE PLAX 2D LVIDd:         4.00 cm LVIDs:         2.50 cm LV PW:         1.10 cm LV IVS:        1.00 cm LVOT diam:     1.80 cm LV SV:         51 LV SV Index:   29 LVOT Area:     2.54 cm LV IVRT:       92 msec  IVC IVC diam: 2.10 cm LEFT ATRIUM           Index LA diam:      4.00 cm 2.25 cm/m LA Vol (A4C): 30.1 ml 16.91 ml/m  AORTIC VALVE LVOT Vmax:   108.00 cm/s LVOT Vmean:  75.800 cm/s  LVOT VTI:    0.202 m  AORTA Ao Root diam: 3.20 cm Ao Asc diam:  3.10 cm TRICUSPID VALVE TR Peak grad:   18.0 mmHg TR Vmax:        212.00 cm/s  SHUNTS  Systemic VTI:  0.20 m Systemic Diam: 1.80 cm Kardie Tobb DO Electronically signed by Dub Huntsman DO Signature Date/Time: 03/01/2024/1:51:19 PM    Final    VAS US  LOWER EXTREMITY VENOUS (DVT) Result Date: 02/29/2024  Lower Venous DVT Study Patient Name:  Karen [name]  Date of Exam:   02/29/2024 Medical Rec #: [id number]       Accession #:    [id number] Date of Birth: Apr 11, 1941       Patient Gender: F Patient Age:   70 years Exam Location:  [facility] Procedure:      VAS US  LOWER EXTREMITY VENOUS (DVT) Referring Phys: DAVID ORTIZ --------------------------------------------------------------------------------  Indications: Pulmonary embolism, and Right sided pain.  Comparison Study: No prior exam. Performing Technologist: Edilia Elden Appl  Examination Guidelines: A complete evaluation includes B-mode imaging, spectral Doppler, color Doppler, and power Doppler as needed of all accessible portions of each vessel. Bilateral testing is considered an integral part of a complete examination. Limited examinations for reoccurring indications may be performed as noted. The reflux portion of the exam is performed with the patient in reverse Trendelenburg.  +---------+---------------+---------+-----------+----------+--------------+ RIGHT    CompressibilityPhasicitySpontaneityPropertiesThrombus Aging +---------+---------------+---------+-----------+----------+--------------+ CFV      Full           Yes      Yes                                 +---------+---------------+---------+-----------+----------+--------------+ SFJ      Full           Yes      Yes                                 +---------+---------------+---------+-----------+----------+--------------+ FV Prox  Full                                                        +---------+---------------+---------+-----------+----------+--------------+ FV Mid   Full                                                         +---------+---------------+---------+-----------+----------+--------------+ FV DistalFull                                                        +---------+---------------+---------+-----------+----------+--------------+ PFV      Full                                                        +---------+---------------+---------+-----------+----------+--------------+  POP      Full           Yes      Yes                                 +---------+---------------+---------+-----------+----------+--------------+ PTV      Full                                                        +---------+---------------+---------+-----------+----------+--------------+ PERO     None           No       No                                  +---------+---------------+---------+-----------+----------+--------------+ Deep vein thrombosis noted in one of the paired peroneal veins of the right lower extremity.  +---------+---------------+---------+-----------+----------+--------------+ LEFT     CompressibilityPhasicitySpontaneityPropertiesThrombus Aging +---------+---------------+---------+-----------+----------+--------------+ CFV      Full           Yes      Yes                                 +---------+---------------+---------+-----------+----------+--------------+ SFJ      Full           Yes      Yes                                 +---------+---------------+---------+-----------+----------+--------------+ FV Prox  Full                                                        +---------+---------------+---------+-----------+----------+--------------+ FV Mid   Full                                                        +---------+---------------+---------+-----------+----------+--------------+ FV DistalFull                                                        +---------+---------------+---------+-----------+----------+--------------+ PFV      Full                                                         +---------+---------------+---------+-----------+----------+--------------+ POP      Full           Yes      Yes                                 +---------+---------------+---------+-----------+----------+--------------+  PTV      Full                                                        +---------+---------------+---------+-----------+----------+--------------+ PERO     Full                                                        +---------+---------------+---------+-----------+----------+--------------+     Summary: RIGHT: - Findings consistent with acute deep vein thrombosis involving the right peroneal veins.  - No cystic structure found in the popliteal fossa.  LEFT: - No evidence of deep vein thrombosis in the lower extremity. No indirect evidence of obstruction proximal to the inguinal ligament.  - No cystic structure found in the popliteal fossa.  *See table(s) above for measurements and observations. Electronically signed by Debby Robertson on 02/29/2024 at 4:47:31 PM.    Final    CT ABDOMEN PELVIS W CONTRAST Result Date: 02/29/2024 CLINICAL DATA:  Right-sided abdominal pain beginning yesterday. Recent aspiration of liver cysts 02/14/2024. EXAM: CT ABDOMEN AND PELVIS WITH CONTRAST TECHNIQUE: Multidetector CT imaging of the abdomen and pelvis was performed using the standard protocol following bolus administration of intravenous contrast. RADIATION DOSE REDUCTION: This exam was performed according to the departmental dose-optimization program which includes automated exposure control, adjustment of the mA and/or kV according to patient size and/or use of iterative reconstruction technique. CONTRAST:  OMNIPAQUE  IOHEXOL  300 MG/ML  SOLN COMPARISON:  02/12/2024 FINDINGS: Lower chest: Borderline stable cardiomegaly calcified plaque over the descending thoracic aorta. There are bilateral proximal lower lobar pulmonary emboli noted. These are  new compared to the recent prior exams. RV/LV ratio less than 1. Small sliding hiatal hernia unchanged. Visualized lung bases demonstrate a new small right pleural effusion with associated hazy right basilar opacification likely atelectasis and less likely infection. Hepatobiliary: Evidence of patient's known numerous liver cysts with interval decrease in size of the previously seen largest cyst in the left lobe now measuring 8.2 cm (previously 11.6 cm). Remaining liver cysts are not significantly changed. Gallbladder and biliary tree are unremarkable. Pancreas: Normal. Spleen: Normal. Adrenals/Urinary Tract: Adrenal glands are normal. Kidneys are normal size without hydronephrosis or nephrolithiasis. Stable upper pole 2.7 cm left renal cyst. Ureters and bladder are normal. Stomach/Bowel: Small sliding hiatal hernia unchanged. Stomach is otherwise unremarkable. Small bowel is normal. Appendix is normal. Moderate diverticulosis of the sigmoid colon as the colon is otherwise unremarkable. Vascular/Lymphatic: Calcified plaque over the abdominal aorta which is normal caliber. Remaining vascular structures are unremarkable. No adenopathy. Reproductive: Uterus and bilateral adnexa are unremarkable. Other: No significant free peritoneal fluid or focal inflammatory change. Small umbilical hernia containing only peritoneal fat. Small right inguinal hernia containing only peritoneal fat. Musculoskeletal: No focal abnormality. Stable grade 1 anterolisthesis of L4 on L5. IMPRESSION: 1. Evidence of patient's known numerous liver cysts with interval decrease in size of the previously seen largest cyst in the left lobe post aspiration now measuring 8.2 cm (previously 11.6 cm). Remaining liver cysts are not significantly changed. 2. New bilateral proximal lower lobar pulmonary emboli. 3. New small right pleural effusion with associated hazy right basilar opacification likely atelectasis and  less likely infection. 4. Small sliding  hiatal hernia unchanged. 5. Moderate diverticulosis of the sigmoid colon without active inflammation. 6. Small umbilical and right inguinal hernias containing only peritoneal fat. 7. Aortic atherosclerosis. Aortic Atherosclerosis (ICD10-I70.0). Critical Value/emergent results were called by telephone at the time of interpretation on 02/29/2024 at 11:48 am to provider [facility] , who verbally acknowledged these results. Electronically Signed   By: Toribio Agreste M.D.   On: 02/29/2024 11:52   US  FINE NEEDLE ASP 1ST LESION Result Date: 02/14/2024 INDICATION: 83 year old with abdominal pain and multiple large hepatic cysts. EXAM: ULTRASOUND-GUIDED ASPIRATION OF HEPATIC CYSTS MEDICATIONS: Moderate sedation ANESTHESIA/SEDATION: Moderate (conscious) sedation was employed during this procedure. A total of Versed 2 mg and fentanyl 100 mcg was administered intravenously at the order of the provider performing the procedure. Total intra-service moderate sedation time: 24 minutes. Patient's level of consciousness and vital signs were monitored continuously by radiology nurse throughout the procedure under the supervision of the provider performing the procedure. COMPLICATIONS: None immediate. PROCEDURE: Informed written consent was obtained from the patient after a thorough discussion of the procedural risks, benefits and alternatives. All questions were addressed. A timeout was performed prior to the initiation of the procedure. Large cysts were identified in the anterior abdomen and involving the left hepatic lobe. Anterior abdomen was prepped with chlorhexidine and sterile field was created. Skin was anesthetized with 1% lidocaine. Using ultrasound guidance, a Yueh catheter was directed into the largest left hepatic cyst. 300 mL of clear yellow fluid was removed. Fluid was sent for culture and cytology. Ultrasound demonstrated a large residual cyst in this area suggesting there are probably 2 separate cysts. A new Yueh  catheter was directed into the other cyst using ultrasound guidance and another 300 mL of clear yellow fluid was removed. Bandage placed over the puncture site. FINDINGS: Multiple large cysts in the liver. Largest cystic structure in the left hepatic lobe appears to represent 2 cysts. Two separate aspirations were performed and a total of 600 mL of fluid was removed. IMPRESSION: Successful ultrasound-guided aspiration of large left hepatic cysts. Total of 600 mL of fluid was removed. Electronically Signed   By: Juliene Balder M.D.   On: 02/14/2024 18:33   US  FINE NEEDLE ASP EA ADDL LESION Result Date: 02/14/2024 INDICATION: 83 year old with abdominal pain and multiple large hepatic cysts. EXAM: ULTRASOUND-GUIDED ASPIRATION OF HEPATIC CYSTS MEDICATIONS: Moderate sedation ANESTHESIA/SEDATION: Moderate (conscious) sedation was employed during this procedure. A total of Versed 2 mg and fentanyl 100 mcg was administered intravenously at the order of the provider performing the procedure. Total intra-service moderate sedation time: 24 minutes. Patient's level of consciousness and vital signs were monitored continuously by radiology nurse throughout the procedure under the supervision of the provider performing the procedure. COMPLICATIONS: None immediate. PROCEDURE: Informed written consent was obtained from the patient after a thorough discussion of the procedural risks, benefits and alternatives. All questions were addressed. A timeout was performed prior to the initiation of the procedure. Large cysts were identified in the anterior abdomen and involving the left hepatic lobe. Anterior abdomen was prepped with chlorhexidine and sterile field was created. Skin was anesthetized with 1% lidocaine. Using ultrasound guidance, a Yueh catheter was directed into the largest left hepatic cyst. 300 mL of clear yellow fluid was removed. Fluid was sent for culture and cytology. Ultrasound demonstrated a large residual cyst in this  area suggesting there are probably 2 separate cysts. A new Yueh catheter was directed into the other cyst  using ultrasound guidance and another 300 mL of clear yellow fluid was removed. Bandage placed over the puncture site. FINDINGS: Multiple large cysts in the liver. Largest cystic structure in the left hepatic lobe appears to represent 2 cysts. Two separate aspirations were performed and a total of 600 mL of fluid was removed. IMPRESSION: Successful ultrasound-guided aspiration of large left hepatic cysts. Total of 600 mL of fluid was removed. Electronically Signed   By: Juliene Balder M.D.   On: 02/14/2024 18:33   CT ABDOMEN PELVIS W CONTRAST Result Date: 02/12/2024 CLINICAL DATA:  Acute abdominal pain. Patient reports continued and worsening left upper abdominal pain. EXAM: CT ABDOMEN AND PELVIS WITH CONTRAST TECHNIQUE: Multidetector CT imaging of the abdomen and pelvis was performed using the standard protocol following bolus administration of intravenous contrast. RADIATION DOSE REDUCTION: This exam was performed according to the departmental dose-optimization program which includes automated exposure control, adjustment of the mA and/or kV according to patient size and/or use of iterative reconstruction technique. CONTRAST:  OMNIPAQUE  IOHEXOL  300 MG/ML  SOLN COMPARISON:  12/15/2023 FINDINGS: Lower chest: Subsegmental atelectasis in the right lower lobe. No pleural effusion. Small hiatal hernia. Hepatobiliary: Numerous cysts in the liver of varying sizes. Largest cyst is within the left lobe of the liver and spans greater than 11 cm transverse and is mildly exophytic. No evidence of solid liver lesion. Partially distended gallbladder, no biliary dilatation. Pancreas: Fatty atrophy. No ductal dilatation or inflammation. No evidence of focal lesion. Spleen: Normal in size without focal abnormality. Adrenals/Urinary Tract: No adrenal nodule. No hydronephrosis or perinephric edema. Homogeneous renal  enhancement with symmetric excretion on delayed phase imaging. Left renal cyst. No further follow-up imaging is recommended. Urinary bladder is completely decompressed. Stomach/Bowel: Small hiatal hernia. The stomach is nondistended. No bowel obstruction or inflammatory change. Sigmoid colonic diverticulosis without diverticulitis. Normal appendix. Vascular/Lymphatic: Aortic atherosclerosis. No aortic aneurysm. No acute vascular findings. No abdominopelvic adenopathy. Reproductive: Uterus and bilateral adnexa are unremarkable. Other: No free air, free fluid, or intra-abdominal fluid collection. Small fat containing umbilical hernia. Musculoskeletal: Levo scoliotic curvature of the spine. Multilevel facet hypertrophy in the spine. There are no acute or suspicious osseous abnormalities. IMPRESSION: 1. No acute abnormality or explanation for abdominal pain. 2. Numerous cysts in the liver of varying sizes. Largest cyst is within the left lobe of the liver and spans greater than 11 cm transverse and is mildly exophytic. Capsular stretching could potentially be a source of left-sided pain. Overall, no change from prior exam. 3. Sigmoid colonic diverticulosis without diverticulitis. 4. Small hiatal hernia. Aortic Atherosclerosis (ICD10-I70.0). Electronically Signed   By: Andrea Gasman M.D.   On: 02/12/2024 15:27    Microbiology: Results for orders placed or performed during the hospital encounter of 02/12/24  Aerobic/Anaerobic Culture w Gram Stain (surgical/deep wound)     Status: None   Collection Time: 02/14/24  4:18 PM   Specimen: Cyst; Body Fluid  Result Value Ref Range Status   Specimen Description   Final    CYSTS Performed at [facility], [address] [place], [place] [address]    Special Requests   Final    NONE Performed at [facility], [address] [place], [place] [address]    Gram Stain NO WBC SEEN NO ORGANISMS SEEN   Final   Culture   Final     No growth aerobically or anaerobically. Performed at [facility], [address] [place], [place] [address]  Report Status 02/19/2024 FINAL  Final    Labs: CBC: Recent Labs  Lab 03/03/24 1121 03/04/24 1225 03/05/24 0940 03/06/24 0919 03/07/24 0901  WBC 8.1 6.1 5.7 8.2 6.2  HGB 10.0* 10.2* 10.7* 11.5* 11.1*  HCT 31.1* 34.0* 33.3* 36.2 34.2*  MCV 86.1 94.4 86.7 88.1 86.4  PLT 220 239 270 288 281   Basic Metabolic Panel: Recent Labs  Lab 03/01/24 0809 03/02/24 0603 03/03/24 1121 03/04/24 1225 03/06/24 1218  NA 136 133* 137 138 138  K 3.5 3.7 4.4 4.8 3.9  CL 98 97* 100 99 103  CO2 26 28 29 29 23   GLUCOSE 101* 107* 122* 118* 99  BUN 21 27* 16 14 16   CREATININE 1.02* 0.95 0.80 0.80 0.85  CALCIUM 9.2 8.7* 9.1 9.0 9.5   Liver Function Tests: Recent Labs  Lab 03/01/24 0809  AST 32  ALT 16  ALKPHOS 85  BILITOT 0.6  PROT 7.7  ALBUMIN 3.4*   CBG: No results for input(s): GLUCAP in the last 168 hours.  Discharge time spent: greater than 30 minutes.  Signed: Delon Herald, MD [facility] 03/07/2024

## 2024-03-07 NOTE — Progress Notes (Signed)
 Discharge medications delivered to patient at the bedside in a secure bag.

## 2024-03-07 NOTE — Assessment & Plan Note (Deleted)
 Compensated

## 2024-03-07 NOTE — Assessment & Plan Note (Deleted)
 Iron and Tsat low.  Hold  iron supplemen

## 2024-03-07 NOTE — Assessment & Plan Note (Deleted)
Referred to hepatology

## 2024-03-07 NOTE — Assessment & Plan Note (Addendum)
 Hold iron supplementation at dc

## 2024-03-07 NOTE — Assessment & Plan Note (Addendum)
 Continue PPI - BID x 4 weeks and then daily

## 2024-03-07 NOTE — Assessment & Plan Note (Addendum)
 CTA showed small to moderate pleural effusion and right lower lobe infiltrates Completed 5 days of ceftriaxone  and azithromycin  Pulmonologist consulted - plan to follow up pleural effusion outpatient

## 2024-03-07 NOTE — Assessment & Plan Note (Signed)
 Compensated

## 2024-03-08 ENCOUNTER — Telehealth: Payer: Self-pay | Admitting: *Deleted

## 2024-03-08 DIAGNOSIS — R195 Other fecal abnormalities: Secondary | ICD-10-CM

## 2024-03-08 NOTE — Telephone Encounter (Signed)
 CBC order placed in EPIC for patient to have completed around 03/22/24.

## 2024-03-08 NOTE — Telephone Encounter (Signed)
-----   Message from Glendia FORBES Holt sent at 03/08/2024  1:06 PM EST ----- Regarding: Repeat CBC Team,  Can you please arrange a repeat CBC in 2 weeks?  Patient was having dark stools and positive FOBT recently in the hospital, was discharged yesterday.  Thanks

## 2024-03-09 ENCOUNTER — Ambulatory Visit (INDEPENDENT_AMBULATORY_CARE_PROVIDER_SITE_OTHER)

## 2024-03-09 VITALS — BP 154/84 | HR 84 | Temp 97.8°F | Ht 64.0 in | Wt 154.0 lb

## 2024-03-09 DIAGNOSIS — J90 Pleural effusion, not elsewhere classified: Secondary | ICD-10-CM

## 2024-03-09 DIAGNOSIS — I2699 Other pulmonary embolism without acute cor pulmonale: Secondary | ICD-10-CM

## 2024-03-09 MED ORDER — APIXABAN 5 MG PO TABS: 5.0000 mg | ORAL_TABLET | Freq: Two times a day (BID) | ORAL | 5 refills | Status: AC

## 2024-03-09 NOTE — Addendum Note (Signed)
 Addended by: ZAIDA MASON on: 03/09/2024 10:38 AM   Modules accepted: Level of Service

## 2024-03-09 NOTE — Progress Notes (Signed)
 Subjective:   PATIENT ID: Karen [name] GENDER: female DOB: April 16, 1941, MRN: [id number]   HPI Discussed the use of AI scribe software for clinical note transcription with the patient, who gave verbal consent to proceed.  History of Present Illness Karen [name] is an 83 year old female who presents for follow-up regarding anticoagulation management after a pulmonary embolism.  She was recently hospitalized and diagnosed with a small clot in the lung and fluid around the right lung. She was started on Eliquis , taking one pill in the morning and one at night. Prior to hospitalization, she experienced pain on the right side, which prompted her to seek medical attention.  Two weeks prior to the recent hospitalization, she was admitted for pain on the left side, which was related to a cyst on the liver. The cyst was drained, and it was confirmed not to be cancerous. After this procedure, she was mostly sedentary at home.  She experiences some shortness of breath since her recent hospitalization, particularly when moving from the living room to the kitchen, requiring her to sit down. She uses a breathing device two to three times a day, which provides relief.  No significant breathing issues prior to the recent hospitalization but notes mild shortness of breath with exertion since then.     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 pylori infection    Hypertension    Iron deficiency anemia 08/05/2016   Pulmonary embolism (HCC) 02/29/2024     Family History  Problem Relation Age of Onset   Hypertension Mother    Hyperlipidemia Mother    Hypertension Father    Colon cancer Neg Hx    Esophageal cancer Neg Hx    Rectal cancer Neg Hx    Stomach cancer Neg Hx      Social History   Socioeconomic History   Marital status: Widowed    Spouse name: Not on file   Number of children: 4   Years  of education: Not on file   Highest education level: Not on file  Occupational History   Not on file  Tobacco Use   Smoking status: Never   Smokeless tobacco: Never  Vaping Use   Vaping status: Never Used  Substance and Sexual Activity   Alcohol use: No   Drug use: No   Sexual activity: Not Currently    Birth control/protection: None  Other Topics Concern   Not on file  Social History Narrative   Not on file   Social Drivers of Health   Financial Resource Strain: Low Risk  (11/03/2021)   Overall Financial Resource Strain (CARDIA)    Difficulty of Paying Living Expenses: Not hard at all  Food Insecurity: No Food Insecurity (02/29/2024)   Hunger Vital Sign    Worried About Running Out of Food in the Last Year: Never true    Ran Out of Food in the Last Year: Never true  Transportation Needs: No Transportation Needs (02/29/2024)   PRAPARE - Administrator, Civil Service (Medical): No    Lack of Transportation (Non-Medical): No  Physical Activity: Sufficiently Active (11/03/2021)   Exercise Vital Sign    Days of Exercise per Week: 5 days    Minutes of Exercise per Session: 30 min  Stress: No Stress Concern Present (11/03/2021)   [employer] of Occupational Health - Occupational Stress Questionnaire    Feeling of  Stress : Not at all  Social Connections: Moderately Integrated (02/29/2024)   Social Connection and Isolation Panel    Frequency of Communication with Friends and Family: Three times a week    Frequency of Social Gatherings with Friends and Family: Three times a week    Attends Religious Services: More than 4 times per year    Active Member of Clubs or Organizations: Yes    Attends Banker Meetings: 1 to 4 times per year    Marital Status: Widowed  Intimate Partner Violence: Not At Risk (02/29/2024)   Humiliation, Afraid, Rape, and Kick questionnaire    Fear of Current or Ex-Partner: No    Emotionally Abused: No    Physically Abused: No     Sexually Abused: No     Allergies  Allergen Reactions   Hydralazine      Report numbness and tingling hands and feet. Anxiety.      Outpatient Medications Prior to Visit  Medication Sig Dispense Refill   acetaminophen  (TYLENOL ) 500 MG tablet Take 1 tablet (500 mg total) by mouth every 8 (eight) hours as needed for mild pain (pain score 1-3), moderate pain (pain score 4-6) or headache.     albuterol  (VENTOLIN  HFA) 108 (90 Base) MCG/ACT inhaler Inhale 2 puffs into the lungs every 6 (six) hours as needed for wheezing or shortness of breath. 6.7 g 2   lidocaine  (LIDODERM ) 5 % Place 1 patch onto the skin daily. Remove & Discard patch within 12 hours or as directed by MD 30 patch 0   losartan -hydrochlorothiazide  (HYZAAR) 100-25 MG tablet TAKE 1 TABLET BY MOUTH DAILY 90 tablet 0   meclizine  (ANTIVERT ) 12.5 MG tablet Take 1 tablet (12.5 mg total) by mouth 3 (three) times daily as needed for dizziness. 60 tablet 0   apixaban  (ELIQUIS ) 5 MG TABS tablet Take 1 tablet (5 mg total) by mouth 2 (two) times daily. 60 tablet 1   pantoprazole  (PROTONIX ) 40 MG tablet Take 1 tablet (40 mg total) by mouth daily for 28 days, THEN 1 tablet daily as needed. 58 tablet 0   No facility-administered medications prior to visit.    ROS Reviewed all systems and reported negative except as above     Objective:   Vitals:   03/09/24 0840  BP: (!) 154/84  Pulse: 84  Temp: 97.8 F (36.6 C)  TempSrc: Oral  SpO2: 97%  Weight: 154 lb (69.9 kg)  Height: 5' 4 (1.626 m)    Physical Exam Physical Exam GENERAL: Appropriate to age, no acute distress. HEAD EYES EARS NOSE THROAT: Moist mucous membranes, atraumatic, normocephalic. CHEST: Clear to auscultation bilaterally, no wheezing, no crackles, no rales. CARDIAC: Regular rate and rhythm, normal S1, normal S2, no murmurs, no rubs, no gallops. ABDOMEN: Soft, nontender. NEUROLOGICAL: Motor and sensation grossly intact, alert and oriented times X 3. EXTREMITIES:  Warm, well perfused, no edema.     CBC    Component Value Date/Time   WBC 6.2 03/07/2024 0901   RBC 3.96 03/07/2024 0901   HGB 11.1 (L) 03/07/2024 0901   HCT 34.2 (L) 03/07/2024 0901   PLT 281 03/07/2024 0901   MCV 86.4 03/07/2024 0901   MCV 88.2 10/19/2012 1110   MCH 28.0 03/07/2024 0901   MCHC 32.5 03/07/2024 0901   RDW 14.6 03/07/2024 0901   LYMPHSABS 1.5 02/29/2024 0956   MONOABS 1.2 (H) 02/29/2024 0956   EOSABS 0.0 02/29/2024 0956   BASOSABS 0.0 02/29/2024 0956     Chest imaging: I  reviewed her CT chest performed on 03/03/2024 with small bilateral pulmonary emboli.  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  No evidence of right heart strain.  Although there is no evidence of clot in her right ventricle on echo.  PFT: No PFTs on file  Ultrasound of the right lung in the clinic showed trace fluid collection not amenable for thoracentesis with a large hepatic cyst        Assessment & Plan:   Assessment and Plan Assessment & Plan Pulmonary embolism Small embolism likely due to recent hospitalization and reduced mobility post-liver cyst drainage. First occurrence. Discussed anticoagulation options and risks.  If this is a provoked event in the setting of her immobilization and cyst drainage, can consider 3 months of anticoagulation.  If this is considered unprovoked then lifelong anticoagulation is considered.per HERDOO2 trial can consider 1 year of [facility] and recheck her D dimer then. Her D Dimer was not checked on presentation and her age adds 1 point. This was discussed with the patient and for now we decided on 1 year of AC - Continue Eliquis  for 1 year. - Perform follow-up blood test after 1 year to assess recurrence risk. - Advised caution to avoid falls and cuts due to bleeding risk. - Instructed to contact office if Eliquis  is too expensive for alternative medication (Xarelto).  Right-sided pleural effusion Ultrasound shows minimal fluid,  nearly resolved.        Zola Herter, MD  Pulmonary & Critical Care Office: [phone]

## 2024-03-09 NOTE — Patient Instructions (Signed)
  VISIT SUMMARY: Today, we discussed your follow-up care for the small clot in your lung and the fluid around your right lung. We reviewed your current medications and symptoms, and made a plan for your continued treatment.  YOUR PLAN: -PULMONARY EMBOLISM: A pulmonary embolism is a blood clot in the lung, often caused by reduced mobility or recent surgery. You will continue taking Eliquis  twice daily for one year to prevent further clots. We will perform a follow-up blood test after one year to assess your risk of recurrence. Please be cautious to avoid falls and cuts due to the increased risk of bleeding while on this medication. If Eliquis  becomes too expensive, contact our office for an alternative medication option.  -RIGHT-SIDED PLEURAL EFFUSION: A pleural effusion is fluid buildup around the lungs. Your recent ultrasound shows that the fluid is nearly resolved, so no further treatment is needed at this time.  INSTRUCTIONS: Continue taking Eliquis  as prescribed. We will perform a follow-up blood test after one year to assess your risk of recurrence. If Eliquis  becomes too expensive, contact our office for an alternative medication option. Be cautious to avoid falls and cuts due to the increased risk of bleeding while on this medication.                Contains text generated by [employer].

## 2024-03-22 ENCOUNTER — Other Ambulatory Visit (INDEPENDENT_AMBULATORY_CARE_PROVIDER_SITE_OTHER)

## 2024-03-22 DIAGNOSIS — K2970 Gastritis, unspecified, without bleeding: Secondary | ICD-10-CM

## 2024-03-22 DIAGNOSIS — B9681 Helicobacter pylori [H. pylori] as the cause of diseases classified elsewhere: Secondary | ICD-10-CM

## 2024-03-22 DIAGNOSIS — R195 Other fecal abnormalities: Secondary | ICD-10-CM | POA: Diagnosis not present

## 2024-03-22 LAB — CBC WITH DIFFERENTIAL/PLATELET
Basophils Absolute: 0 K/uL (ref 0.0–0.1)
Basophils Relative: 0.6 % (ref 0.0–3.0)
Eosinophils Absolute: 0.1 K/uL (ref 0.0–0.7)
Eosinophils Relative: 1.5 % (ref 0.0–5.0)
HCT: 35.3 % — ABNORMAL LOW (ref 36.0–46.0)
Hemoglobin: 11.7 g/dL — ABNORMAL LOW (ref 12.0–15.0)
Lymphocytes Relative: 34 % (ref 12.0–46.0)
Lymphs Abs: 2.6 K/uL (ref 0.7–4.0)
MCHC: 33.2 g/dL (ref 30.0–36.0)
MCV: 83.5 fl (ref 78.0–100.0)
Monocytes Absolute: 0.9 K/uL (ref 0.1–1.0)
Monocytes Relative: 11.8 % (ref 3.0–12.0)
Neutro Abs: 4 K/uL (ref 1.4–7.7)
Neutrophils Relative %: 52.1 % (ref 43.0–77.0)
Platelets: 258 K/uL (ref 150.0–400.0)
RBC: 4.23 Mil/uL (ref 3.87–5.11)
RDW: 14.4 % (ref 11.5–15.5)
WBC: 7.7 K/uL (ref 4.0–10.5)

## 2024-03-27 ENCOUNTER — Ambulatory Visit: Payer: Self-pay | Admitting: Gastroenterology

## 2024-03-27 NOTE — Progress Notes (Signed)
 Karen [name],  Your hemoglobin continues to be stable.  No concerns for GI bleeding.  Please continue to take your blood thinner as instructed for the blood clot in your lung.

## 2024-05-01 ENCOUNTER — Telehealth: Payer: Self-pay

## 2024-05-01 NOTE — Telephone Encounter (Signed)
 Please advise.   Copied from CRM [id number]. Topic: Appointments - Transfer of Care >> Apr 24, 2024 11:02 AM Ahlexyia S wrote: Pt is requesting to transfer FROM: Sigrid Deidra Fox MD Pt is requesting to transfer TO: Billy Knee FNP Reason for requested transfer: Pt preference It is the responsibility of the team the patient would like to transfer to (Dr. Billy) to reach out to the patient if for any reason this transfer is not acceptable.

## 2024-05-23 ENCOUNTER — Encounter: Payer: Self-pay | Admitting: Internal Medicine

## 2024-05-23 ENCOUNTER — Ambulatory Visit: Admitting: Internal Medicine

## 2024-05-23 VITALS — BP 186/77 | HR 64 | Temp 97.7°F | Ht 64.0 in | Wt 158.4 lb

## 2024-05-23 DIAGNOSIS — E782 Mixed hyperlipidemia: Secondary | ICD-10-CM

## 2024-05-23 DIAGNOSIS — I5032 Chronic diastolic (congestive) heart failure: Secondary | ICD-10-CM

## 2024-05-23 DIAGNOSIS — E559 Vitamin D deficiency, unspecified: Secondary | ICD-10-CM

## 2024-05-23 DIAGNOSIS — I10 Essential (primary) hypertension: Secondary | ICD-10-CM

## 2024-05-23 DIAGNOSIS — Z7901 Long term (current) use of anticoagulants: Secondary | ICD-10-CM | POA: Insufficient documentation

## 2024-05-23 DIAGNOSIS — Q446 Cystic disease of liver: Secondary | ICD-10-CM

## 2024-05-23 DIAGNOSIS — R202 Paresthesia of skin: Secondary | ICD-10-CM

## 2024-05-23 DIAGNOSIS — H43393 Other vitreous opacities, bilateral: Secondary | ICD-10-CM | POA: Insufficient documentation

## 2024-05-23 DIAGNOSIS — D692 Other nonthrombocytopenic purpura: Secondary | ICD-10-CM | POA: Insufficient documentation

## 2024-05-23 DIAGNOSIS — Z86711 Personal history of pulmonary embolism: Secondary | ICD-10-CM | POA: Insufficient documentation

## 2024-05-23 NOTE — Progress Notes (Signed)
 " [facility] PRIMARY [facility]ARE [facility] [address] [place] [address] Dept: [phone] Dept Fax: [phone]  New Patient Office Visit  Subjective:   Karen [name] Feb 18, 1941 05/23/2024  Chief Complaint  Patient presents with   Establish Care    No concerns DUE for Zoster, and Dtap/Tdap/TD vaccine     HPI: Karen [name] presents today to establish care at [facility] at [facility]. Introduced to publishing rights manager role and practice setting.  All questions answered.  Concerns: See below   Discussed the use of AI scribe software for clinical note transcription with the patient, who gave verbal consent to proceed.  History of Present Illness   Karen [name] is an 84 year old female who presents to establish care as a new patient.  She has a history of essential hypertension and is currently taking losartan  hydrochlorothiazide  100-25 mg once daily. She does not monitor her blood pressure at home but continues to take her medication as prescribed.  She was diagnosed with a pulmonary embolism in November 2025 and is currently managed by a pulmonologist. She is taking Eliquis  twice daily and will continue for at least one year. She uses a spirometer at home, which helps with her breathing. She has a follow-up appointment with her pulmonologist on June 15, 2024.  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 There are more cysts now, and she is being monitored.   She has a history of vitamin D  deficiency and is taking a vitamin D  supplement. Recent lab work showed stable hemoglobin levels at 11.7%. She does not take an iron supplement despite a history of iron deficiency.   She experiences a tingling sensation in her feet at night for the past two to three months. This sensation is not painful but bothersome. She recalls using a heating device in the hospital, which did alleviate  the sensation.       The following portions of the patient's history were reviewed and updated as appropriate: past medical history, past surgical history, family history, social history, allergies, medications, and problem list.   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 pylori infection    Hypertension    Iron deficiency anemia 08/05/2016   Pulmonary embolism (HCC) 02/29/2024   Past Surgical History:  Procedure Laterality Date   CATARACT EXTRACTION, BILATERAL     ESOPHAGOGASTRODUODENOSCOPY     EYE SURGERY  on her retina   liver cyst aspiration  01/2024   Family History  Problem Relation Age of Onset   Hypertension Mother    Hyperlipidemia Mother    Hypertension Father    Colon cancer Neg Hx    Esophageal cancer Neg Hx    Rectal cancer Neg Hx    Stomach cancer Neg Hx    Current Medications[1] Allergies[2]  ROS: A complete  ROS was performed with pertinent positives/negatives noted in the HPI. The remainder of the ROS are negative.   Objective:   Today's Vitals   05/23/24 1441 05/23/24 1530  BP: (!) 177/75 (!) 186/77  Pulse: 64   Temp: 97.7 F (36.5 C)   TempSrc: Oral   Weight: 158 lb 6.4 oz (71.8 kg)   Height: 5' 4 (1.626 m)     GENERAL: Well-appearing, in NAD. Well nourished.  SKIN: Pink, warm and dry. No rash, lesion, ulceration, or ecchymoses.  NECK: Trachea midline. Full ROM w/o pain or tenderness. No lymphadenopathy.  RESPIRATORY: Chest wall symmetrical. Respirations even and non-labored. Breath sounds clear to auscultation bilaterally.  CARDIAC: S1, S2 present, regular rate and rhythm. Peripheral pulses 2+ bilaterally.  EXTREMITIES: Without clubbing, cyanosis, or edema.  NEUROLOGIC: No motor or sensory deficits. Steady, even gait.  PSYCH/MENTAL STATUS: Alert, oriented x 3. Cooperative, appropriate mood and affect.   Health Maintenance Due  Topic Date Due   Zoster Vaccines- Shingrix (1 of 2) Never done   DTaP/Tdap/Td (2 - Td or Tdap) 05/23/2019   Medicare Annual Wellness (AWV)  11/04/2022    No results found for any visits on 05/23/24.  Assessment & Plan:  Assessment and Plan    Essential hypertension Hypertension managed with losartan  hydrochlorothiazide  100/25 mg daily. - BP high today, patient reports BP is elevated when she comes to the doctor. Advised to check BP at home.  - Continue losartan  hydrochlorothiazide  100/25 mg once daily. - BMP  Personal history of pulmonary embolism (on anticoagulation) Pulmonary embolism managed with Eliquis . Follow-up with pulmonology scheduled. - Continue Eliquis  as prescribed. - Follow up with pulmonology on February 27th, 2026.  Cystic disease of liver Multiple liver cysts managed by specialists. No immediate intervention required. - Continue monitoring liver cysts with specialists.  Vitamin D  deficiency Managed with vitamin D   supplementation. Efficacy uncertain. - Checked vitamin D  level today.  Tingling sensation  Reports tingling in feet at bedtime, possible neuropathy or circulation issue. No pain. - Monitor symptoms and consider further investigation if symptoms worsen. - Advised use of socks at night to promote circulation / warmth     Orders Placed This Encounter  Procedures   Basic metabolic panel with GFR   VITAMIN D  25 Hydroxy (Vit-D Deficiency, Fractures)   No orders of the defined types were placed in this encounter.   Return in about 3 months (around 08/20/2024) for Hypertension, Cholesterol, Vitamin D  deficiency, PE, liver cyst - fasting lab work at appt.   Rosina Senters, FNP     [1]  Current Outpatient Medications:    acetaminophen  (TYLENOL ) 500 MG tablet, Take 1 tablet (500 mg total) by mouth every 8 (eight) hours as needed for mild pain (pain score 1-3), moderate pain (pain score 4-6) or headache., Disp: , Rfl:    albuterol  (VENTOLIN  HFA) 108 (90 Base) MCG/ACT inhaler, Inhale 2 puffs into the lungs every 6 (six) hours as needed for wheezing or shortness of breath., Disp: 6.7 g, Rfl: 2   apixaban  (ELIQUIS ) 5 MG TABS tablet, Take 1 tablet (  5 mg total) by mouth 2 (two) times daily., Disp: 60 tablet, Rfl: 5   Cholecalciferol (D 1000) 25 MCG (1000 UT) capsule, Take 1,000 Units by mouth., Disp: , Rfl:    lidocaine  (LIDODERM ) 5 %, Place 1 patch onto the skin daily. Remove & Discard patch within 12 hours or as directed by MD, Disp: 30 patch, Rfl: 0   losartan -hydrochlorothiazide  (HYZAAR) 100-25 MG tablet, TAKE 1 TABLET BY MOUTH DAILY, Disp: 90 tablet, Rfl: 0   meclizine  (ANTIVERT ) 12.5 MG tablet, Take 1 tablet (12.5 mg total) by mouth 3 (three) times daily as needed for dizziness., Disp: 60 tablet, Rfl: 0   Multiple Vitamins-Minerals (PRESERVISION AREDS 2 PO), Take by mouth., Disp: , Rfl:    pantoprazole  (PROTONIX ) 40 MG tablet, Take 1 tablet (40 mg total) by mouth daily for 28 days, THEN 1 tablet  daily as needed. (Patient not taking: No sig reported), Disp: 58 tablet, Rfl: 0 [2]  Allergies Allergen Reactions   Hydralazine      Report numbness and tingling hands and feet. Anxiety.    "

## 2024-05-23 NOTE — Assessment & Plan Note (Signed)
 Stable. Recent echo in Nov 2025 with EF 60-65%. Continue to monitor.

## 2024-05-24 ENCOUNTER — Ambulatory Visit: Payer: Self-pay | Admitting: Internal Medicine

## 2024-05-24 DIAGNOSIS — E559 Vitamin D deficiency, unspecified: Secondary | ICD-10-CM

## 2024-05-24 LAB — BASIC METABOLIC PANEL WITH GFR
BUN: 19 mg/dL (ref 6–23)
CO2: 31 meq/L (ref 19–32)
Calcium: 9.5 mg/dL (ref 8.4–10.5)
Chloride: 103 meq/L (ref 96–112)
Creatinine, Ser: 0.81 mg/dL (ref 0.40–1.20)
GFR: 66.68 mL/min
Glucose, Bld: 91 mg/dL (ref 70–99)
Potassium: 3.6 meq/L (ref 3.5–5.1)
Sodium: 140 meq/L (ref 135–145)

## 2024-05-24 LAB — VITAMIN D 25 HYDROXY (VIT D DEFICIENCY, FRACTURES): VITD: 19.26 ng/mL — ABNORMAL LOW (ref 30.00–100.00)

## 2024-05-24 MED ORDER — VITAMIN D (ERGOCALCIFEROL) 1.25 MG (50000 UNIT) PO CAPS: 50000.0000 [IU] | ORAL_CAPSULE | ORAL | 2 refills | Status: AC

## 2024-05-24 NOTE — Progress Notes (Signed)
 Hi Karen [name],  Your vitamin D  level remains low. I would like for you to STOP taking your once daily vitamin D  supplement, and START a once weekly supplement of vitamin D  for 3 months, then switch back to taking your vitamin D  supplement once a day once you complete the 3 month prescription.  I have sent this prescription into your pharmacy.  Rosina

## 2024-08-20 ENCOUNTER — Ambulatory Visit: Admitting: Internal Medicine
